# Patient Record
Sex: Male | Born: 1960 | Race: White | Hispanic: No | Marital: Single | Smoking: Current every day smoker
Health system: Southern US, Community
[De-identification: ages and names within clinical notes are randomized; demographics above are authoritative.]

## PROBLEM LIST (undated history)

## (undated) ENCOUNTER — Ambulatory Visit

## (undated) ENCOUNTER — Ambulatory Visit: Admission: EM | Disposition: A | Discharge status: Home / Self Care | Payer: Medicare Other

## (undated) DIAGNOSIS — I10 Essential (primary) hypertension: Secondary | ICD-10-CM

## (undated) DIAGNOSIS — F79 Unspecified intellectual disabilities: Secondary | ICD-10-CM

## (undated) DIAGNOSIS — N289 Disorder of kidney and ureter, unspecified: Secondary | ICD-10-CM

## (undated) DIAGNOSIS — K219 Gastro-esophageal reflux disease without esophagitis: Secondary | ICD-10-CM

## (undated) DIAGNOSIS — F32A Depression, unspecified: Secondary | ICD-10-CM

## (undated) DIAGNOSIS — E119 Type 2 diabetes mellitus without complications: Secondary | ICD-10-CM

## (undated) DIAGNOSIS — F329 Major depressive disorder, single episode, unspecified: Secondary | ICD-10-CM

## (undated) HISTORY — PX: CYST REMOVAL TRUNK: SHX6283

---

## 1998-12-30 ENCOUNTER — Inpatient Hospital Stay (HOSPITAL_COMMUNITY): Admission: EM | Admit: 1998-12-30 | Discharge: 1999-01-02 | Payer: Self-pay | Admitting: *Deleted

## 2000-10-29 ENCOUNTER — Emergency Department (HOSPITAL_COMMUNITY): Admission: EM | Admit: 2000-10-29 | Discharge: 2000-10-29 | Payer: Self-pay | Admitting: Internal Medicine

## 2000-12-28 ENCOUNTER — Emergency Department (HOSPITAL_COMMUNITY): Admission: EM | Admit: 2000-12-28 | Discharge: 2000-12-28 | Payer: Self-pay | Admitting: *Deleted

## 2001-07-31 ENCOUNTER — Emergency Department (HOSPITAL_COMMUNITY): Admission: EM | Admit: 2001-07-31 | Discharge: 2001-07-31 | Payer: Self-pay | Admitting: Emergency Medicine

## 2002-12-22 ENCOUNTER — Emergency Department (HOSPITAL_COMMUNITY): Admission: EM | Admit: 2002-12-22 | Discharge: 2002-12-23 | Payer: Self-pay | Admitting: Emergency Medicine

## 2003-01-02 ENCOUNTER — Emergency Department (HOSPITAL_COMMUNITY): Admission: EM | Admit: 2003-01-02 | Discharge: 2003-01-02 | Payer: Self-pay | Admitting: Emergency Medicine

## 2004-04-16 ENCOUNTER — Emergency Department (HOSPITAL_COMMUNITY): Admission: EM | Admit: 2004-04-16 | Discharge: 2004-04-16 | Payer: Self-pay | Admitting: Emergency Medicine

## 2004-12-03 ENCOUNTER — Emergency Department (HOSPITAL_COMMUNITY): Admission: EM | Admit: 2004-12-03 | Discharge: 2004-12-03 | Payer: Self-pay | Admitting: Emergency Medicine

## 2005-01-29 ENCOUNTER — Emergency Department (HOSPITAL_COMMUNITY): Admission: EM | Admit: 2005-01-29 | Discharge: 2005-01-29 | Payer: Self-pay | Admitting: Emergency Medicine

## 2005-06-09 ENCOUNTER — Ambulatory Visit (HOSPITAL_COMMUNITY): Admission: RE | Admit: 2005-06-09 | Discharge: 2005-06-09 | Payer: Self-pay

## 2009-07-20 ENCOUNTER — Encounter (INDEPENDENT_AMBULATORY_CARE_PROVIDER_SITE_OTHER): Payer: Self-pay | Admitting: *Deleted

## 2009-07-27 ENCOUNTER — Encounter (INDEPENDENT_AMBULATORY_CARE_PROVIDER_SITE_OTHER): Payer: Self-pay | Admitting: *Deleted

## 2009-08-01 ENCOUNTER — Encounter (INDEPENDENT_AMBULATORY_CARE_PROVIDER_SITE_OTHER): Payer: Self-pay | Admitting: *Deleted

## 2009-08-01 ENCOUNTER — Ambulatory Visit: Payer: Self-pay | Admitting: Gastroenterology

## 2009-08-14 ENCOUNTER — Telehealth: Payer: Self-pay | Admitting: Gastroenterology

## 2009-08-15 ENCOUNTER — Ambulatory Visit: Payer: Self-pay | Admitting: Gastroenterology

## 2009-08-17 ENCOUNTER — Encounter: Payer: Self-pay | Admitting: Gastroenterology

## 2010-05-14 NOTE — Progress Notes (Signed)
Summary: ? re proc  Phone Note Call from Patient Call back at Home Phone [phone]   Caller: [place] Caretaker Call For: Sharlett Iles Reason for Call: Talk to Nurse Summary of Call: [facility] caregiver of patient states that the patient had corn yesterday and is schedule for a procedure tomorrow, will he need to resch or will the doctor perform the procedure. Initial call taken by: Ronalee Red,  Aug 14, 2009 1:34 PM  Follow-up for Phone Call        Spoke with caretaker; said he ate some "string beans yesterday." Pt has had clear liquids today.  Writer told caretaker for pt to push fluids today and follow prep instructions exactly.  Caretaker voiced understanding Follow-up by: Randall Hiss RN,  Aug 14, 2009 2:16 PM

## 2010-05-14 NOTE — Letter (Signed)
Summary: Diabetic Instructions  [facility]  [place], [place] [address]   Phone: [phone]  Fax: [phone]    David Zamora 1961/02/09 MRN: [id number]   _X  _   ORAL DIABETIC MEDICATION INSTRUCTIONS  The day before your procedure:   Take your diabetic pill as you do normally  The day of your procedure:   Do not take your diabetic pill    We will check your blood sugar levels during the admission process and again in Recovery before discharging you home  ________________________________________________________________________  _ X _   INSULIN (LONG ACTING) MEDICATION INSTRUCTIONS (Lantus, NPH, 70/30, Humulin, Novolin-N)   The day before your procedure:   Take  your regular evening dose    The day of your procedure:   Do not take your morning dose

## 2010-05-14 NOTE — Letter (Signed)
Summary: [facility] Instructions  [facility]  [place], [place] [address]   Phone: [phone]  Fax: [phone]       JACEN MAHANNAH    [birth year]    MRN: [id number]        Procedure Day Sudie Grumbling:  [facility]  08/15/09     Arrival Time:  8:30AM     Procedure Time:  9:30AM     Location of Procedure:                    _X _  [place] (4th Floor)                        [place]   Starting 5 days prior to your procedure 08/10/09 do not eat nuts, seeds, popcorn, corn, beans, peas,  salads, or any raw vegetables.  Do not take any fiber supplements (e.g. Metamucil, Citrucel, and Benefiber).  THE DAY BEFORE YOUR PROCEDURE         DATE: 08/14/09  DAY: TUESDAY  1.  Drink clear liquids the entire day-NO SOLID FOOD  2.  Do not drink anything colored red or purple.  Avoid juices with pulp.  No orange juice.  3.  Drink at least 64 oz. (8 glasses) of fluid/clear liquids during the day to prevent dehydration and help the prep work efficiently.  CLEAR LIQUIDS INCLUDE: Water Jello Ice Popsicles Tea (sugar ok, no milk/cream) Powdered fruit flavored drinks Coffee (sugar ok, no milk/cream) Gatorade Juice: apple, white grape, white cranberry  Lemonade Clear bullion, consomm, broth Carbonated beverages (any kind) Strained chicken noodle soup Hard Candy                             4.  In the morning, mix first dose of MoviPrep solution:    Empty 1 Pouch A and 1 Pouch B into the disposable container    Add lukewarm drinking water to the top line of the container. Mix to dissolve    Refrigerate (mixed solution should be used within 24 hrs)  5.  Begin drinking the prep at 5:00 p.m. The MoviPrep container is divided by 4 marks.   Every 15 minutes drink the solution down to the next mark (approximately 8 oz) until the full liter is complete.   6.  Follow completed prep with 16 oz of clear liquid of your choice (Nothing  red or purple).  Continue to drink clear liquids until bedtime.  7.  Before going to bed, mix second dose of MoviPrep solution:    Empty 1 Pouch A and 1 Pouch B into the disposable container    Add lukewarm drinking water to the top line of the container. Mix to dissolve    Refrigerate  THE DAY OF YOUR PROCEDURE      DATE: 08/15/09  DAY:   WEDNESDAY  Beginning at 4:30AM (5 hours before procedure):         1. Every 15 minutes, drink the solution down to the next mark (approx 8 oz) until the full liter is complete.  2. Follow completed prep with 16 oz. of clear liquid of your choice.    3. You may drink clear liquids until 7:30AM (2 HOURS BEFORE PROCEDURE).   MEDICATION INSTRUCTIONS  Unless otherwise instructed, you should take regular prescription medications with a small sip of water   as early as possible  the morning of your procedure.  Diabetic patients - see separate instructions.   Additional medication instructions: Hold morning Triamterene/HCTZ the morning of procedure.         OTHER INSTRUCTIONS  You will need a responsible adult at least 52 years of age to accompany you and drive you home.   This person must remain in the waiting room during your procedure.  Wear loose fitting clothing that is easily removed.  Leave jewelry and other valuables at home.  However, you may wish to bring a book to read or  an iPod/MP3 player to listen to music as you wait for your procedure to start.  Remove all body piercing jewelry and leave at home.  Total time from sign-in until discharge is approximately 2-3 hours.  You should go home directly after your procedure and rest.  You can resume normal activities the  day after your procedure.  The day of your procedure you should not:   Drive   Make legal decisions   Operate machinery   Drink alcohol   Return to work  You will receive specific instructions about eating, activities and medications before you  leave.    The above instructions have been reviewed and explained to me by   Emerson Monte RN  August 01, 2009 11:49 AM     I fully understand and can verbalize these instructions _____________________________ Date _________

## 2010-05-14 NOTE — Letter (Signed)
Summary: Previsit letter  [facility]  [place], [place] [address]   Phone: [phone]  Fax: [phone]       07/20/2009 MRN: [id number]  [place], [place]  [address]  Dear David Zamora,  Welcome to the Gastroenterology Division at [facility].    You are scheduled to see a nurse for your pre-procedure visit on 08-01-09 at 11AM on the 3rd floor at [facility], [place] [employer].  We ask that you try to arrive at our office 15 minutes prior to your appointment time to allow for check-in.  Your nurse visit will consist of discussing your medical and surgical history, your immediate family medical history, and your medications.    Please bring a complete list of all your medications or, if you prefer, bring the medication bottles and we will list them.  We will need to be aware of both prescribed and over the counter drugs.  We will need to know exact dosage information as well.  If you are on blood thinners (Coumadin, Plavix, Aggrenox, Ticlid, etc.) please call our office today/prior to your appointment, as we need to consult with your physician about holding your medication.   Please be prepared to read and sign documents such as consent forms, a financial agreement, and acknowledgement forms.  If necessary, and with your consent, a friend or relative is welcome to sit-in on the nurse visit with you.  Please bring your insurance card so that we may make a copy of it.  If your insurance requires a referral to see a specialist, please bring your referral form from your primary care physician.  No co-pay is required for this nurse visit.     If you cannot keep your appointment, please call [phone] to cancel or reschedule prior to your appointment date.  This allows [place] the opportunity to schedule an appointment for another patient in need of care.    Thank you for choosing [facility] for your medical needs.  We  appreciate the opportunity to care for you.  Please visit [place] at our website  to learn more about our practice.                     Sincerely.                                                                                                                   The Gastroenterology Division

## 2010-05-14 NOTE — Miscellaneous (Signed)
Summary: [facility] Previsit/prep  Clinical Lists Changes  Medications: Added new medication of MOVIPREP 100 GM  SOLR (PEG-KCL-NACL-NASULF-NA ASC-C) As per prep instructions. - Signed Rx of MOVIPREP 100 GM  SOLR (PEG-KCL-NACL-NASULF-NA ASC-C) As per prep instructions.;  #1 x 0;  Signed;  Entered by: Emerson Monte RN;  Authorized by: Sable Feil MD [facility];  Method used: Electronically to [facility] [address], [place], [place], [place]  [address], Ph: [phone], Fax: [phone] Observations: Added new observation of NKA: T (08/01/2009 10:06)    Prescriptions: MOVIPREP 100 GM  SOLR (PEG-KCL-NACL-NASULF-NA ASC-C) As per prep instructions.  #1 x 0   Entered by:   Emerson Monte RN   Authorized by:   Sable Feil MD [facility]   Signed by:   Emerson Monte RN on 08/01/2009   Method used:   Electronically to        [facility] (retail)       [address]       [place], [place]  [address]       Ph: [phone]       Fax: [phone]   RxID:   [id number]

## 2010-05-14 NOTE — Letter (Signed)
Summary: Patient Notice- Polyp Results  [facility]  [address] [place], [place] [address]   Phone: [phone]  Fax: [phone]        Aug 17, 2009 MRN: [id number]    [facility] [place],   [address]    Dear David Zamora,  I am pleased to inform you that the colon polyp(s) removed during your recent colonoscopy was (were) found to be benign (no cancer detected) upon pathologic examination.  I recommend you have a repeat colonoscopy examination in 10_ years to look for recurrent polyps, as having colon polyps increases your risk for having recurrent polyps or even colon cancer in the future.  Should you develop new or worsening symptoms of abdominal pain, bowel habit changes or bleeding from the rectum or bowels, please schedule an evaluation with either your primary care physician or with me.  Additional information/recommendations:  x__ No further action with gastroenterology is needed at this time. Please      follow-up with your primary care physician for your other healthcare      needs.  __ Please call [phone] to schedule a return visit to review your      situation.  __ Please keep your follow-up visit as already scheduled.  __ Continue treatment plan as outlined the day of your exam.  Please call us if you are having persistent problems or have questions about your condition that have not been fully answered at this time.  Sincerely,  Sable Feil MD [facility]  This letter has been electronically signed by your physician.  Appended Document: Patient Notice- Polyp Results letter mailed 4.9.11.

## 2010-05-14 NOTE — Procedures (Signed)
Summary: Colonoscopy  Patient: [name] Note: All result statuses are Final unless otherwise noted.  Tests: (1) Colonoscopy (COL)   COL Colonoscopy           [facility]     [place], [place]  [address]           COLONOSCOPY PROCEDURE REPORT           PATIENT:  David Zamora, David Zamora  MR#:  [id number]     BIRTHDATE:  02-04-1961, 48 yrs. old  GENDER:  male     ENDOSCOPIST:  Loralee Pacas. Sharlett Iles, MD, [facility]     REF. BY:  Jerene Bears, M.D.     PROCEDURE DATE:  08/15/2009     PROCEDURE:  Colonoscopy with snare polypectomy     ASA CLASS:  Class II     INDICATIONS:  Routine Risk Screening     MEDICATIONS:   Fentanyl 50 mcg IV, Versed 5 mg IV           DESCRIPTION OF PROCEDURE:   After the risks benefits and     alternatives of the procedure were thoroughly explained, informed     consent was obtained.  Digital rectal exam was performed and     revealed no abnormalities.   The LB CF-[id number] endoscope     was introduced through the anus and advanced to the cecum, which     was identified by both the appendix and ileocecal valve, without     limitations.  The quality of the prep was excellent, using     MoviPrep.  The instrument was then slowly withdrawn as the colon     was fully examined.     <<PROCEDUREIMAGES>>           FINDINGS:  There were multiple polyps identified and removed.     SMALL DIMINUTIVE RECTOSIGMOID NODULES COLD SNARE EXCISED.  Mild     diverticulosis was found. SCATTERED IN CECUM AND SIGMOID AREA.     Retroflexed views in the rectum revealed no abnormalities.    The     scope was then withdrawn from the patient and the procedure     completed.           COMPLICATIONS:  None     ENDOSCOPIC IMPRESSION:     1) Polyps, multiple     2) Mild diverticulosis     R/O ADENOMAS.     RECOMMENDATIONS:     1) Repeat colonoscopy in 5 years if polyp adenomatous; otherwise     10 years     REPEAT EXAM:  No        ______________________________     Loralee Pacas. Sharlett Iles, MD, [facility]           CC:           n.     eSIGNED:   Loralee Pacas. Patterson at 08/15/2009 09:58 AM           Charna Archer, [id number]  Note: An exclamation mark (!) indicates a result that was not dispersed into the flowsheet. Document Creation Date: 08/15/2009 9:59 AM _______________________________________________________________________  (1) Order result status: Final Collection or observation date-time: 08/15/2009 09:52 Requested date-time:  Receipt date-time:  Reported date-time:  Referring Physician:   Ordering Physician: Verl Blalock [phone]) Specimen Source:  Source: Tawanna Cooler Order Number: [id number] Lab site:   Appended Document: Colonoscopy     Procedures Next Due Date:  Colonoscopy: 08/2019

## 2011-04-13 ENCOUNTER — Encounter: Payer: Self-pay | Admitting: *Deleted

## 2011-04-13 ENCOUNTER — Emergency Department (HOSPITAL_COMMUNITY)
Admission: EM | Admit: 2011-04-13 | Discharge: 2011-04-16 | Disposition: A | Discharge status: Home / Self Care | Payer: Medicare Other | Attending: Emergency Medicine | Admitting: Emergency Medicine

## 2011-04-13 ENCOUNTER — Emergency Department (HOSPITAL_COMMUNITY): Admission: EM | Admit: 2011-04-13 | Discharge: 2011-04-13 | Payer: Self-pay | Source: Home / Self Care

## 2011-04-13 DIAGNOSIS — R45851 Suicidal ideations: Secondary | ICD-10-CM | POA: Insufficient documentation

## 2011-04-13 DIAGNOSIS — X838XXA Intentional self-harm by other specified means, initial encounter: Secondary | ICD-10-CM | POA: Insufficient documentation

## 2011-04-13 DIAGNOSIS — T71192A Asphyxiation due to mechanical threat to breathing due to other causes, intentional self-harm, initial encounter: Secondary | ICD-10-CM | POA: Insufficient documentation

## 2011-04-13 DIAGNOSIS — IMO0002 Reserved for concepts with insufficient information to code with codable children: Secondary | ICD-10-CM | POA: Insufficient documentation

## 2011-04-13 DIAGNOSIS — Z8659 Personal history of other mental and behavioral disorders: Secondary | ICD-10-CM | POA: Insufficient documentation

## 2011-04-13 DIAGNOSIS — Y9210 Unspecified residential institution as the place of occurrence of the external cause: Secondary | ICD-10-CM | POA: Insufficient documentation

## 2011-04-13 HISTORY — DX: Gastro-esophageal reflux disease without esophagitis: K21.9

## 2011-04-13 HISTORY — DX: Unspecified intellectual disabilities: F79

## 2011-04-13 HISTORY — DX: Major depressive disorder, single episode, unspecified: F32.9

## 2011-04-13 HISTORY — DX: Depression, unspecified: F32.A

## 2011-04-13 HISTORY — DX: Essential (primary) hypertension: I10

## 2011-04-13 LAB — COMPREHENSIVE METABOLIC PANEL
AST: 28 U/L (ref 0–37)
CO2: 27 mEq/L (ref 19–32)
Chloride: 106 mEq/L (ref 96–112)
GFR calc Af Amer: 87 mL/min — ABNORMAL LOW (ref >90)
GFR calc non Af Amer: 75 mL/min — ABNORMAL LOW (ref >90)
Glucose, Bld: 114 mg/dL — ABNORMAL HIGH (ref 70–99)

## 2011-04-13 LAB — GLUCOSE, CAPILLARY

## 2011-04-13 LAB — CBC
HCT: 38.1 % — ABNORMAL LOW (ref 39.0–52.0)
MCH: 32.9 pg (ref 26.0–34.0)
MCV: 95.7 fL (ref 78.0–100.0)
Platelets: 247 10*3/uL (ref 150–400)
RBC: 3.98 MIL/uL — ABNORMAL LOW (ref 4.22–5.81)
WBC: 5.2 10*3/uL (ref 4.0–10.5)

## 2011-04-13 LAB — RAPID URINE DRUG SCREEN, HOSP PERFORMED
Amphetamines: NOT DETECTED
Barbiturates: NOT DETECTED
Cocaine: NOT DETECTED
Opiates: NOT DETECTED

## 2011-04-13 LAB — VALPROIC ACID LEVEL: Valproic Acid Lvl: 30.4 ug/mL — ABNORMAL LOW (ref 50.0–100.0)

## 2011-04-13 MED ORDER — TETANUS-DIPHTH-ACELL PERTUSSIS 5-2.5-18.5 LF-MCG/0.5 IM SUSP
0.5000 mL | Freq: Once | INTRAMUSCULAR | Status: AC
  Administered 2011-04-13: 0.5 mL via INTRAMUSCULAR
  Filled 2011-04-13: qty 0.5

## 2011-04-13 MED ORDER — LORAZEPAM 1 MG PO TABS: 1.0000 mg | ORAL_TABLET | Freq: Three times a day (TID) | ORAL | Status: DC | PRN

## 2011-04-13 MED ORDER — HALOPERIDOL LACTATE 5 MG/ML IJ SOLN
10.0000 mg | Freq: Once | INTRAMUSCULAR | Status: AC
  Administered 2011-04-13: 10 mg via INTRAMUSCULAR

## 2011-04-13 MED ORDER — HALOPERIDOL LACTATE 5 MG/ML IJ SOLN
INTRAMUSCULAR | Status: AC
  Administered 2011-04-13: 10 mg
  Filled 2011-04-13: qty 2

## 2011-04-13 MED ORDER — IBUPROFEN 600 MG PO TABS: 600.0000 mg | ORAL_TABLET | Freq: Three times a day (TID) | ORAL | Status: DC | PRN

## 2011-04-13 MED ORDER — INSULIN GLARGINE 100 UNIT/ML ~~LOC~~ SOLN
SUBCUTANEOUS | Status: AC
  Filled 2011-04-13: qty 1

## 2011-04-13 MED ORDER — ACETAMINOPHEN 325 MG PO TABS: 650.0000 mg | ORAL_TABLET | ORAL | Status: DC | PRN

## 2011-04-13 MED ORDER — INSULIN GLARGINE 100 UNIT/ML ~~LOC~~ SOLN: 15.0000 [IU] | Freq: Every day | SUBCUTANEOUS | Status: DC

## 2011-04-13 MED ORDER — NICOTINE 21 MG/24HR TD PT24
21.0000 mg | MEDICATED_PATCH | Freq: Every day | TRANSDERMAL | Status: DC
  Administered 2011-04-14: 21 mg via TRANSDERMAL
  Filled 2011-04-13 (×3): qty 1
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
## 2011-04-13 NOTE — ED Notes (Signed)
Security/gpd at bedside, pt laying on bed
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
## 2011-04-13 NOTE — ED Notes (Signed)
Security/gpd at bedside, dr Cathleen Fears into see
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
## 2011-04-13 NOTE — ED Notes (Signed)
Patient refused TDaP injection. Dr. Ashok Cordia notified. Will cont to monitor.
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
## 2011-04-13 NOTE — ED Notes (Signed)
Pt placed in blue scrubs, wanded by security

## 2011-04-13 NOTE — ED Notes (Signed)
1 bag of belongings locked in activity room

## 2011-04-13 NOTE — ED Notes (Signed)
Report given to psy ED

## 2011-04-14 LAB — GLUCOSE, CAPILLARY
Glucose-Capillary: 136 mg/dL — ABNORMAL HIGH (ref 70–99)
Glucose-Capillary: 195 mg/dL — ABNORMAL HIGH (ref 70–99)
Glucose-Capillary: 199 mg/dL — ABNORMAL HIGH (ref 70–99)

## 2011-04-14 MED ORDER — INSULIN DETEMIR 100 UNIT/ML ~~LOC~~ SOLN
20.0000 [IU] | Freq: Every day | SUBCUTANEOUS | Status: DC
Start: 2011-04-14 — End: 2011-04-16
  Administered 2011-04-14 – 2011-04-15 (×2): 20 [IU] via SUBCUTANEOUS
  Filled 2011-04-14: qty 3

## 2011-04-14 MED ORDER — BUPROPION HCL ER (XL) 150 MG PO TB24
150.0000 mg | ORAL_TABLET | Freq: Every day | ORAL | Status: DC
  Administered 2011-04-14 – 2011-04-16 (×3): 150 mg via ORAL
  Filled 2011-04-14 (×4): qty 1

## 2011-04-14 MED ORDER — METFORMIN HCL 500 MG PO TABS
500.0000 mg | ORAL_TABLET | Freq: Every day | ORAL | Status: DC
  Administered 2011-04-14 – 2011-04-16 (×3): 500 mg via ORAL
  Filled 2011-04-14 (×4): qty 1

## 2011-04-14 MED ORDER — FLUOXETINE HCL 20 MG PO CAPS
20.0000 mg | ORAL_CAPSULE | Freq: Every day | ORAL | Status: DC
  Administered 2011-04-14 – 2011-04-16 (×3): 20 mg via ORAL
  Filled 2011-04-14 (×4): qty 1

## 2011-04-14 MED ORDER — DIVALPROEX SODIUM ER 500 MG PO TB24
1000.0000 mg | ORAL_TABLET | Freq: Every day | ORAL | Status: DC
  Administered 2011-04-14 – 2011-04-15 (×3): 1000 mg via ORAL
  Filled 2011-04-14 (×6): qty 2

## 2011-04-14 MED ORDER — SIMVASTATIN 40 MG PO TABS
40.0000 mg | ORAL_TABLET | Freq: Every day | ORAL | Status: DC
  Administered 2011-04-14 – 2011-04-15 (×2): 40 mg via ORAL
  Filled 2011-04-14 (×4): qty 1

## 2011-04-14 MED ORDER — QUETIAPINE FUMARATE 100 MG PO TABS
100.0000 mg | ORAL_TABLET | Freq: Every day | ORAL | Status: DC
  Administered 2011-04-14 – 2011-04-15 (×2): 100 mg via ORAL
  Filled 2011-04-14 (×2): qty 1

## 2011-04-14 MED ORDER — TRIAMTERENE-HCTZ 37.5-25 MG PO TABS
0.5000 | ORAL_TABLET | Freq: Every day | ORAL | Status: DC
  Administered 2011-04-14: 1 via ORAL
  Administered 2011-04-15 – 2011-04-16 (×2): 0.5 via ORAL
  Filled 2011-04-14 (×4): qty 0.5

## 2011-04-14 MED ORDER — DIVALPROEX SODIUM 500 MG PO DR TAB: 1000.0000 mg | DELAYED_RELEASE_TABLET | Freq: Once | ORAL | Status: DC

## 2011-04-14 MED ORDER — DIVALPROEX SODIUM 250 MG PO DR TAB: 250.0000 mg | DELAYED_RELEASE_TABLET | ORAL | Status: DC

## 2011-04-14 MED ORDER — DIVALPROEX SODIUM ER 250 MG PO TB24
250.0000 mg | ORAL_TABLET | Freq: Every day | ORAL | Status: DC
  Administered 2011-04-14 – 2011-04-16 (×3): 250 mg via ORAL
  Filled 2011-04-14 (×4): qty 1

## 2011-04-14 MED ORDER — BUPROPION HCL ER (XL) 150 MG PO TB24: 150.0000 mg | ORAL_TABLET | Freq: Every day | ORAL | Status: DC

## 2011-04-14 NOTE — ED Provider Notes (Addendum)
He was seen at 09:35 hours and was sleeping. He has apparently been stable overnight after medication following his outburst yesterday. A tele-psychiatric assessment has been done, and has recommended inpatient treatment, as well as changing to a different group home  Richarda Blade, MD 04/14/11 [phone]  I have been informed by the psychiatric nurse at the patient has been cleared to return to his group home. The patient has been followed by Dr. Sherlynn Stalls here in the ED. Personnel  from group home, here to pick him up.  Richarda Blade, MD 04/16/11 [phone]

## 2011-04-15 DIAGNOSIS — T71192A Asphyxiation due to mechanical threat to breathing due to other causes, intentional self-harm, initial encounter: Secondary | ICD-10-CM

## 2011-04-15 DIAGNOSIS — IMO0002 Reserved for concepts with insufficient information to code with codable children: Secondary | ICD-10-CM

## 2011-04-15 DIAGNOSIS — R45851 Suicidal ideations: Secondary | ICD-10-CM

## 2011-04-15 LAB — GLUCOSE, CAPILLARY: Glucose-Capillary: 104 mg/dL — ABNORMAL HIGH (ref 70–99)

## 2011-04-15 NOTE — Progress Notes (Signed)
ASSESSOR PER PREVIOUS REPORT RECEIVED FROM [facility] P. DR. AHLUWALIA HAS CLEARED PATIENT, AND PT MAY RETURN TO GROUP HOME IF THEY ARE WILLING TO ACCEPT PT BACK. ASSESSOR ATTEMPTED TO REACH GROUP HOME OWNER "PETE" AT TELEPHONE NUMBER LISTED ON PT'S CHART, AS PROVIDED BY PT'S NURSE JANIE. ASSESSOR ASKED PT IF HE KNEW OF THE NAME OF THE GROUP HOME HE WAS CURRENTLY RESIDING AT OR AN ALTERNATIVE CONTACT PERSON. PT STATES HE DOES NOT.   PT REPORTS THAT PER "PETE"GROUP HOME OWNER, SAYS HE CAN GO BACK TO GROUP HOME. PT ALSO REPORTS THAT "PETE" WENT TO [place] TODAY TO PICK UP PT'S CAMER[place] AND WILL NOT BE BACK UNTIL LATER THIS EVENING. ASSESSOR UNABLE TO CONFIRM OFFICIALLY WITH GROUP HOME STAFF IF PT CAN RETURN. ON CALL SW NOTIFIED TO FOLLOW UP WITH PT'S DISPOSITION AND RETURNING TO GROUP HOME.ASSESSOR LEFT DETAILED MESSAGE @[phone] FOR SW TO FOLLOW UP WITH PT'S DISPOSITION IN REGARD TO GROUP HOME RETURN OR ALTERNATIVE PLACEMENT IF PT IS UNABLE TO RETURN TO GROUP HOME. EDP DR. LINKER NOTIFIED OF PT'S CURRENT STATUS.

## 2011-04-15 NOTE — ED Notes (Signed)
Care assumed, pt in room lying in bed watching television

## 2011-04-15 NOTE — ED Provider Notes (Signed)
Pt seen and evaluated in psych ED.  He is eating breakfast and is calm and cooperative at this time.  He is under IVC.  He has been assessed by ACT team and is under review at [facility].    Threasa Beards, MD 04/15/11 [phone]

## 2011-04-15 NOTE — Consult Note (Signed)
  David Zamora is a 51 y.o. male [id number] April 24, 1960  Subjective/Objective:  Patient is doing much better his affect improved. He denies having suicidal or homicidal ideations. Patient told me that he is going to work with his case Freight forwarder and will regularly talk with him about his issues and will not do anything to hold himself again. Patient is not hallucinating or delusional. He is alert awake oriented x3. Attention concentration good abstract ability fair insight and judgment intact.  Filed Vitals:   04/15/11 1216  BP: 116/77  Pulse: 77  Temp: 98.5 F (36.9 C)  Resp: 19    Lab Results:   BMET    Component Value Date/Time   NA 142 04/13/2011 1400   K 4.1 04/13/2011 1400   CL 106 04/13/2011 1400   CO2 27 04/13/2011 1400   GLUCOSE 114* 04/13/2011 1400   BUN 20 04/13/2011 1400   CREATININE 1.10 04/13/2011 1400   CALCIUM 9.7 04/13/2011 1400   GFRNONAA 75* 04/13/2011 1400   GFRAA 87* 04/13/2011 1400    Medications:  Scheduled:     . buPROPion  150 mg Oral Daily  . divalproex  1,000 mg Oral QHS  . divalproex  250 mg Oral Daily  . FLUoxetine  20 mg Oral Daily  . insulin detemir  20 Units Subcutaneous QHS  . metFORMIN  500 mg Oral Q breakfast  . nicotine  21 mg Transdermal Daily  . QUEtiapine  100 mg Oral QHS  . simvastatin  40 mg Oral QHS  . triamterene-hydrochlorothiazide  0.5 each Oral Daily     PRN Meds acetaminophen, ibuprofen, LORazepam  Assessment/Plan:  Patient can be discharged to followup in the outpatient setting.  AHLUWALIA,SHAMSHER S MD 04/15/2011

## 2011-04-15 NOTE — BH Assessment (Signed)
Patient evaluated by Dr. Sherlynn Stalls and cleared from psych. Dr. Sherlynn Stalls recommended that patient returns to group home. On-coming staff Pincus Sanes) will initiate process of having patient return to group home if they will accept him back .

## 2011-04-15 NOTE — ED Notes (Signed)
ACT into see 

## 2011-04-15 NOTE — BH Assessment (Deleted)
[place] referral needed, per Dr. Sherlynn Stalls. On-coming staff made aware of patients disposition. Patient declined from in-pt admission to [facility], per Dr. Sherlynn Stalls.

## 2011-04-15 NOTE — ED Notes (Signed)
Dr Clovia Cuff into see

## 2011-04-15 NOTE — ED Notes (Signed)
Up to the bathroom 

## 2011-04-15 NOTE — ED Provider Notes (Signed)
Patient resting comfortably with no complaints.  Dot Lanes, MD 04/15/11 2227

## 2011-04-16 LAB — GLUCOSE, CAPILLARY: Glucose-Capillary: 159 mg/dL — ABNORMAL HIGH (ref 70–99)

## 2011-04-16 NOTE — Discharge Planning (Signed)
[name] spoke with Laurey Arrow, patient's caregiver who advised patient is able to return to his facility. Laurey Arrow has go get patient's meds from pharmacy in [place], and he will pick patient up when he returns from there.   [name] provided him with contact number to advise when he is on his way. Laurey Arrow advised he hopes to be here by 3pm.  Laurena Spies , MSW, LCSWA 04/16/2011 10:01 AM [id number]

## 2011-04-16 NOTE — ED Notes (Signed)
[name] reviewed with pt and group home owner, [name] [phone]), psychiatrists and therapists in the area as well as the mobile crisis unit. Pt was able to sign a no harm contract with [name] and [place] witnessing. David Zamora, mother and legal guardian [phone]) notified of pt returning to group home. No further needs identified at this time.

## 2011-04-16 NOTE — ED Notes (Signed)
Patient discharged to group home with caregiver

## 2011-04-16 NOTE — Consult Note (Signed)
  David Zamora is a 51 y.o. male [id number] [birth year]  Subjective/Objective: Patient is doing better is not suicidal homicidal not hallucinating or delusional. No side effects reported from the medications. Psychoeducation given to the patient.   Filed Vitals:   04/16/11 0613  BP:   Pulse: 62  Temp: 97.7 F (36.5 C)  Resp: 16    Lab Results:   BMET    Component Value Date/Time   NA 142 04/13/2011 1400   K 4.1 04/13/2011 1400   CL 106 04/13/2011 1400   CO2 27 04/13/2011 1400   GLUCOSE 114* 04/13/2011 1400   BUN 20 04/13/2011 1400   CREATININE 1.10 04/13/2011 1400   CALCIUM 9.7 04/13/2011 1400   GFRNONAA 75* 04/13/2011 1400   GFRAA 87* 04/13/2011 1400    Medications:  Scheduled:     . buPROPion  150 mg Oral Daily  . divalproex  1,000 mg Oral QHS  . divalproex  250 mg Oral Daily  . FLUoxetine  20 mg Oral Daily  . insulin detemir  20 Units Subcutaneous QHS  . metFORMIN  500 mg Oral Q breakfast  . nicotine  21 mg Transdermal Daily  . QUEtiapine  100 mg Oral QHS  . simvastatin  40 mg Oral QHS  . triamterene-hydrochlorothiazide  0.5 each Oral Daily     PRN Meds acetaminophen, ibuprofen, LORazepam  Assessment/Plan:  Patient can be discharged to the group home.  Jillianne Gamino S MD 04/16/2011

## 2011-04-16 NOTE — ED Provider Notes (Signed)
Vitals stable, glucoses stable, resting currently with no complaints.  Telepsych consultation recommends inpatient.  Placement is pending.    David Zamora. Dorna Mai, MD 04/16/11 [id number]

## 2012-11-12 ENCOUNTER — Emergency Department (HOSPITAL_COMMUNITY)
Admission: EM | Admit: 2012-11-12 | Discharge: 2012-11-13 | Disposition: A | Discharge status: Home / Self Care | Payer: Medicare Other | Attending: Emergency Medicine | Admitting: Emergency Medicine

## 2012-11-12 ENCOUNTER — Encounter (HOSPITAL_COMMUNITY): Payer: Self-pay | Admitting: Emergency Medicine

## 2012-11-12 DIAGNOSIS — Z79899 Other long term (current) drug therapy: Secondary | ICD-10-CM | POA: Insufficient documentation

## 2012-11-12 DIAGNOSIS — K219 Gastro-esophageal reflux disease without esophagitis: Secondary | ICD-10-CM | POA: Insufficient documentation

## 2012-11-12 DIAGNOSIS — E119 Type 2 diabetes mellitus without complications: Secondary | ICD-10-CM

## 2012-11-12 DIAGNOSIS — IMO0002 Reserved for concepts with insufficient information to code with codable children: Secondary | ICD-10-CM

## 2012-11-12 DIAGNOSIS — R45851 Suicidal ideations: Secondary | ICD-10-CM | POA: Insufficient documentation

## 2012-11-12 DIAGNOSIS — R4589 Other symptoms and signs involving emotional state: Secondary | ICD-10-CM

## 2012-11-12 DIAGNOSIS — F79 Unspecified intellectual disabilities: Secondary | ICD-10-CM | POA: Insufficient documentation

## 2012-11-12 DIAGNOSIS — F329 Major depressive disorder, single episode, unspecified: Secondary | ICD-10-CM | POA: Insufficient documentation

## 2012-11-12 DIAGNOSIS — I10 Essential (primary) hypertension: Secondary | ICD-10-CM | POA: Insufficient documentation

## 2012-11-12 DIAGNOSIS — F911 Conduct disorder, childhood-onset type: Secondary | ICD-10-CM | POA: Insufficient documentation

## 2012-11-12 DIAGNOSIS — F3289 Other specified depressive episodes: Secondary | ICD-10-CM | POA: Insufficient documentation

## 2012-11-12 DIAGNOSIS — Z794 Long term (current) use of insulin: Secondary | ICD-10-CM | POA: Insufficient documentation

## 2012-11-12 DIAGNOSIS — F17200 Nicotine dependence, unspecified, uncomplicated: Secondary | ICD-10-CM | POA: Insufficient documentation

## 2012-11-12 LAB — BASIC METABOLIC PANEL
BUN: 27 mg/dL — ABNORMAL HIGH (ref 6–23)
CO2: 24 mEq/L (ref 19–32)
Calcium: 10.3 mg/dL (ref 8.4–10.5)
Chloride: 101 mEq/L (ref 96–112)
Creatinine, Ser: 1.5 mg/dL — ABNORMAL HIGH (ref 0.50–1.35)
GFR calc Af Amer: 59 mL/min — ABNORMAL LOW (ref >90)
GFR calc non Af Amer: 51 mL/min — ABNORMAL LOW (ref >90)
Glucose, Bld: 133 mg/dL — ABNORMAL HIGH (ref 70–99)
Potassium: 4.2 mEq/L (ref 3.5–5.1)
Sodium: 136 mEq/L (ref 135–145)

## 2012-11-12 LAB — CBC
Hemoglobin: 13 g/dL (ref 13.0–17.0)
MCHC: 34.9 g/dL (ref 30.0–36.0)
Platelets: 272 10*3/uL (ref 150–400)
RBC: 3.95 MIL/uL — ABNORMAL LOW (ref 4.22–5.81)

## 2012-11-12 LAB — ETHANOL: Alcohol, Ethyl (B): <11 mg/dL (ref 0–11)

## 2012-11-12 LAB — RAPID URINE DRUG SCREEN, HOSP PERFORMED
Amphetamines: NOT DETECTED
Barbiturates: NOT DETECTED
Benzodiazepines: NOT DETECTED
Cocaine: NOT DETECTED
Opiates: NOT DETECTED
Tetrahydrocannabinol: NOT DETECTED

## 2012-11-12 MED ORDER — SIMVASTATIN 40 MG PO TABS
40.0000 mg | ORAL_TABLET | Freq: Every day | ORAL | Status: DC
  Filled 2012-11-12 (×2): qty 1

## 2012-11-12 MED ORDER — INSULIN DETEMIR 100 UNIT/ML ~~LOC~~ SOLN
20.0000 [IU] | Freq: Every day | SUBCUTANEOUS | Status: DC
  Filled 2012-11-12 (×2): qty 0.2

## 2012-11-12 MED ORDER — NICOTINE 21 MG/24HR TD PT24
21.0000 mg | MEDICATED_PATCH | Freq: Every day | TRANSDERMAL | Status: DC
  Filled 2012-11-12: qty 1

## 2012-11-12 MED ORDER — INSULIN ASPART 100 UNIT/ML ~~LOC~~ SOLN
0.0000 [IU] | Freq: Three times a day (TID) | SUBCUTANEOUS | Status: DC
  Administered 2012-11-13: 2 [IU] via SUBCUTANEOUS
  Filled 2012-11-12: qty 2

## 2012-11-12 MED ORDER — ONDANSETRON HCL 4 MG PO TABS: 4.0000 mg | ORAL_TABLET | Freq: Three times a day (TID) | ORAL | Status: DC | PRN

## 2012-11-12 MED ORDER — ZOLPIDEM TARTRATE 5 MG PO TABS: 5.0000 mg | ORAL_TABLET | Freq: Every evening | ORAL | Status: DC | PRN

## 2012-11-12 MED ORDER — ALUM & MAG HYDROXIDE-SIMETH 200-200-20 MG/5ML PO SUSP: 30.0000 mL | ORAL | Status: DC | PRN

## 2012-11-12 MED ORDER — QUETIAPINE FUMARATE 25 MG PO TABS: 25.0000 mg | ORAL_TABLET | Freq: Every day | ORAL | Status: DC

## 2012-11-12 MED ORDER — BUPROPION HCL ER (XL) 150 MG PO TB24
150.0000 mg | ORAL_TABLET | Freq: Every morning | ORAL | Status: DC
  Administered 2012-11-13: 150 mg via ORAL
  Filled 2012-11-12: qty 1

## 2012-11-12 MED ORDER — FENOFIBRATE 160 MG PO TABS
160.0000 mg | ORAL_TABLET | Freq: Every day | ORAL | Status: DC
  Administered 2012-11-13: 160 mg via ORAL
  Filled 2012-11-12: qty 1

## 2012-11-12 MED ORDER — PANTOPRAZOLE SODIUM 40 MG PO TBEC
40.0000 mg | DELAYED_RELEASE_TABLET | Freq: Every day | ORAL | Status: DC
  Administered 2012-11-13: 40 mg via ORAL
  Filled 2012-11-12: qty 1

## 2012-11-12 MED ORDER — TRIAMTERENE-HCTZ 37.5-25 MG PO TABS
0.5000 | ORAL_TABLET | Freq: Every morning | ORAL | Status: DC
  Administered 2012-11-13: 0.5 via ORAL
  Filled 2012-11-12: qty 0.5

## 2012-11-12 MED ORDER — METFORMIN HCL 500 MG PO TABS: 500.0000 mg | ORAL_TABLET | Freq: Every morning | ORAL | Status: DC

## 2012-11-12 MED ORDER — PAROXETINE HCL 20 MG PO TABS
20.0000 mg | ORAL_TABLET | Freq: Every morning | ORAL | Status: DC
  Administered 2012-11-13: 20 mg via ORAL
  Filled 2012-11-12: qty 1

## 2012-11-12 MED ORDER — IRBESARTAN 150 MG PO TABS
150.0000 mg | ORAL_TABLET | Freq: Every day | ORAL | Status: DC
  Administered 2012-11-13: 150 mg via ORAL
  Filled 2012-11-12: qty 1

## 2012-11-12 MED ORDER — ACETAMINOPHEN 325 MG PO TABS: 650.0000 mg | ORAL_TABLET | ORAL | Status: DC | PRN

## 2012-11-12 MED ORDER — IBUPROFEN 200 MG PO TABS: 600.0000 mg | ORAL_TABLET | Freq: Three times a day (TID) | ORAL | Status: DC | PRN

## 2012-11-12 MED ORDER — LORAZEPAM 1 MG PO TABS: 1.0000 mg | ORAL_TABLET | Freq: Three times a day (TID) | ORAL | Status: DC | PRN

## 2012-11-12 MED ORDER — DIVALPROEX SODIUM ER 250 MG PO TB24: 250.0000 mg | ORAL_TABLET | Freq: Two times a day (BID) | ORAL | Status: DC
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
## 2012-11-12 NOTE — ED Notes (Signed)
Pt is unable to urinate at this time. 

## 2012-11-13 DIAGNOSIS — F339 Major depressive disorder, recurrent, unspecified: Secondary | ICD-10-CM

## 2012-11-13 LAB — CREATININE, SERUM: Creatinine, Ser: 1.33 mg/dL (ref 0.50–1.35)

## 2012-11-13 LAB — GLUCOSE, CAPILLARY
Glucose-Capillary: 148 mg/dL — ABNORMAL HIGH (ref 70–99)
Glucose-Capillary: 109 mg/dL — ABNORMAL HIGH (ref 70–99)

## 2012-11-13 MED ORDER — DIVALPROEX SODIUM ER 250 MG PO TB24
250.0000 mg | ORAL_TABLET | Freq: Every day | ORAL | Status: DC
  Administered 2012-11-13: 250 mg via ORAL
  Filled 2012-11-13: qty 1

## 2012-11-13 MED ORDER — DIVALPROEX SODIUM ER 500 MG PO TB24
1000.0000 mg | ORAL_TABLET | Freq: Every day | ORAL | Status: DC
  Filled 2012-11-13 (×2): qty 2

## 2012-11-13 NOTE — ED Notes (Signed)
Insulin verified with Genella Rife RN

## 2012-11-13 NOTE — ED Notes (Signed)
Spoke with David Zamora, the patient's caregiver to notify him of discharge. Caregiver states that staff will be available to pick him up in 2-3 hours.

## 2012-11-13 NOTE — ED Notes (Signed)
D/C instructions given to both patient and group home staff member. Belongings bag x1 returned. Ambulatory without difficulty. No complaints voiced.
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
## 2012-11-13 NOTE — Progress Notes (Signed)
PHARMACIST - PHYSICIAN COMMUNICATION DR:   CONCERNING:  METFORMIN SAFE ADMINISTRATION POLICY  RECOMMENDATION: Metformin has been placed on DISCONTINUE (rejected order) STATUS and should be reordered only after any of the conditions below are ruled out.  Current safety recommendations include avoiding metformin for a minimum of 48 hours after the patient's exposure to intravenous contrast media.  DESCRIPTION:  The Pharmacy Committee has adopted a policy that restricts the use of metformin in hospitalized patients until all the contraindications to administration have been ruled out. Specific contraindications are: [x]  Serum creatinine ? 1.5 for males []  Serum creatinine ? 1.4 for females []  Shock, acute MI, sepsis, hypoxemia, dehydration []  Planned administration of intravenous iodinated contrast media []  Heart Failure patients with low EF []  Acute or chronic metabolic acidosis (including DKA)      Will follow renal function and restart metformin when Scr falls below 1.5  Leone Haven, PharmD 11/12/12

## 2012-11-13 NOTE — BH Assessment (Signed)
[facility] Assessment Progress Note   Spoke with Group Home worker, Delton, and he agreed to take pt back to [facility] and can pick up around 1400 or 1500 today.  Delton has had pt in care since 2009.  Group Home knows pt well.  Will get pt a psych eval with Dr. Jobie Quaker of [facility] next week.

## 2012-11-13 NOTE — ED Provider Notes (Signed)
He has now been evaluated by tele- psychiatry, Dr. Allena Napoleon; who recommends that he be discharged back to his group home. He has reversed the IVC.    Richarda Blade, MD 11/13/12 [phone]
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
## 2013-02-20 ENCOUNTER — Emergency Department: Payer: Self-pay | Admitting: Emergency Medicine

## 2013-02-20 LAB — URINALYSIS, COMPLETE
Bilirubin,UR: NEGATIVE
Blood: NEGATIVE
Nitrite: NEGATIVE
Ph: 7 (ref 4.5–8.0)
Protein: NEGATIVE
Squamous Epithelial: NONE SEEN

## 2013-02-20 LAB — CBC
MCH: 33.6 pg (ref 26.0–34.0)
MCHC: 34.7 g/dL (ref 32.0–36.0)
MCV: 97 fL (ref 80–100)
Platelet: 209 10*3/uL (ref 150–440)

## 2013-02-20 LAB — COMPREHENSIVE METABOLIC PANEL
Chloride: 100 mmol/L (ref 98–107)
Glucose: 418 mg/dL — ABNORMAL HIGH (ref 65–99)
Osmolality: 289 (ref 275–301)
Potassium: 4.1 mmol/L (ref 3.5–5.1)
Sodium: 133 mmol/L — ABNORMAL LOW (ref 136–145)

## 2013-02-22 HISTORY — PX: FINGER FRACTURE SURGERY: SHX638

## 2014-09-05 LAB — HM DIABETES EYE EXAM

## 2014-09-16 ENCOUNTER — Emergency Department
Admission: EM | Admit: 2014-09-16 | Discharge: 2014-09-17 | Disposition: A | Discharge status: Home / Self Care | Payer: Medicare Other | Attending: Emergency Medicine | Admitting: Emergency Medicine

## 2014-09-16 ENCOUNTER — Encounter: Payer: Self-pay | Admitting: Emergency Medicine

## 2014-09-16 DIAGNOSIS — R739 Hyperglycemia, unspecified: Secondary | ICD-10-CM

## 2014-09-16 DIAGNOSIS — I10 Essential (primary) hypertension: Secondary | ICD-10-CM | POA: Insufficient documentation

## 2014-09-16 DIAGNOSIS — Z87891 Personal history of nicotine dependence: Secondary | ICD-10-CM | POA: Diagnosis not present

## 2014-09-16 DIAGNOSIS — E1165 Type 2 diabetes mellitus with hyperglycemia: Secondary | ICD-10-CM | POA: Insufficient documentation

## 2014-09-16 DIAGNOSIS — Z79899 Other long term (current) drug therapy: Secondary | ICD-10-CM | POA: Diagnosis not present

## 2014-09-16 DIAGNOSIS — Z794 Long term (current) use of insulin: Secondary | ICD-10-CM | POA: Diagnosis not present

## 2014-09-16 LAB — URINALYSIS COMPLETE WITH MICROSCOPIC (ARMC ONLY)
BILIRUBIN URINE: NEGATIVE
Glucose, UA: >500 mg/dL — AB
HGB URINE DIPSTICK: NEGATIVE
Leukocytes, UA: NEGATIVE
Nitrite: NEGATIVE
Protein, ur: NEGATIVE mg/dL
Specific Gravity, Urine: 1.017 (ref 1.005–1.030)
pH: 6 (ref 5.0–8.0)

## 2014-09-16 LAB — COMPREHENSIVE METABOLIC PANEL
ALT: 22 U/L (ref 17–63)
AST: 27 U/L (ref 15–41)
Albumin: 3.9 g/dL (ref 3.5–5.0)
Alkaline Phosphatase: 107 U/L (ref 38–126)
Anion gap: 7 (ref 5–15)
BUN: 23 mg/dL — AB (ref 6–20)
CALCIUM: 9.2 mg/dL (ref 8.9–10.3)
CO2: 24 mmol/L (ref 22–32)
Chloride: 108 mmol/L (ref 101–111)
Creatinine, Ser: 1.51 mg/dL — ABNORMAL HIGH (ref 0.61–1.24)
GFR calc Af Amer: 58 mL/min — ABNORMAL LOW (ref >60)
GFR calc non Af Amer: 50 mL/min — ABNORMAL LOW (ref >60)
Glucose, Bld: 351 mg/dL — ABNORMAL HIGH (ref 65–99)
Potassium: 5 mmol/L (ref 3.5–5.1)
Sodium: 139 mmol/L (ref 135–145)
TOTAL PROTEIN: 6.6 g/dL (ref 6.5–8.1)
Total Bilirubin: 0.5 mg/dL (ref 0.3–1.2)

## 2014-09-16 LAB — CBC
HCT: 32.9 % — ABNORMAL LOW (ref 40.0–52.0)
HEMOGLOBIN: 11.3 g/dL — AB (ref 13.0–18.0)
MCH: 34.8 pg — ABNORMAL HIGH (ref 26.0–34.0)
MCHC: 34.5 g/dL (ref 32.0–36.0)
MCV: 100.9 fL — AB (ref 80.0–100.0)
PLATELETS: 184 10*3/uL (ref 150–440)
RBC: 3.26 MIL/uL — AB (ref 4.40–5.90)
RDW: 13.9 % (ref 11.5–14.5)
WBC: 5.1 10*3/uL (ref 3.8–10.6)

## 2014-09-16 LAB — GLUCOSE, CAPILLARY
GLUCOSE-CAPILLARY: 281 mg/dL — AB (ref 65–99)
Glucose-Capillary: 386 mg/dL — ABNORMAL HIGH (ref 65–99)

## 2014-09-16 NOTE — ED Notes (Signed)
EMS pt from [facility] with hyperglycemia; blood sugar 461 by EMS; report pt was taken off insulin and changed to Metformin about a week ago and his blood sugar has been reading high since; pt with no complaints

## 2014-09-16 NOTE — ED Notes (Signed)
Patient brought in by ems from a group home. Group home reports that the patient's blood sugar was elevated at 450. Patient denies any symptoms.

## 2014-09-17 DIAGNOSIS — E1165 Type 2 diabetes mellitus with hyperglycemia: Secondary | ICD-10-CM | POA: Diagnosis not present

## 2014-09-17 LAB — GLUCOSE, CAPILLARY: Glucose-Capillary: 262 mg/dL — ABNORMAL HIGH (ref 65–99)

## 2014-09-17 MED ORDER — SODIUM CHLORIDE 0.9 % IV BOLUS (SEPSIS)
1000.0000 mL | Freq: Once | INTRAVENOUS | Status: AC
  Administered 2014-09-17: 1000 mL via INTRAVENOUS

## 2014-09-17 NOTE — Discharge Instructions (Signed)

## 2014-09-17 NOTE — ED Provider Notes (Signed)
[facility] Emergency Department Provider Note  ____________________________________________  Time seen: Approximately 1:01 AM  I have reviewed the triage vital signs and the nursing notes.   HISTORY  Chief Complaint Hyperglycemia    HPI David Zamora is a 54 y.o. male who comes in today because his sugars have been going up and down a lot. The patient reports that his blood sugars 450s today. The patient reports that it was high just today. He reports that he had hot dogs and fries today. The patient reports that when his blood sugars were high he was feeling some he headed. He reports that this occurred at 74. The patient's staff reports that they check his blood sugars once a day at around 18-1900 after dinner. The patient sugars per the patient typically run in the 80s but they're unsure as 80 but have his book which states his typical blood sugars. When asked if the patient had been sick he reports that he has been vomiting a lot lately. He reports that he has been vomiting 2 times a day but he said he has not been telling his staff. When asked to specify the patient could not clarify when this started and what else has been going on. He also said that he was vomiting because he was fed mustard and he is allergic to mustard. The patient is not a good historian as he looks to his staff to answer half of his questions. The patient reports that he had a bowel movement today but could not determine if it was diarrhea or normal. The staff reports that they brought him in for evaluation of his high blood sugar.   Past Medical History  Diagnosis Date  . Diabetes mellitus   . Depression   . Hypertension   . Mental retardation   . GERD (gastroesophageal reflux disease)     There are no active problems to display for this patient.   No past surgical history on file.  Current Outpatient Rx  Name  Route  Sig  Dispense  Refill  . buPROPion (WELLBUTRIN XL) 150 MG  24 hr tablet   Oral   Take 150 mg by mouth every morning.          . divalproex (DEPAKOTE ER) 250 MG 24 hr tablet   Oral   Take 250-1,000 mg by mouth 2 (two) times daily. 1 tablet in the morning and 4 tablets at bedtime         . econazole nitrate 1 % cream   Topical   Apply 1 application topically daily.         . fenofibrate (TRICOR) 145 MG tablet   Oral   Take 145 mg by mouth every morning.         . insulin detemir (LEVEMIR) 100 UNIT/ML injection   Subcutaneous   Inject 20 Units into the skin at bedtime.           . metFORMIN (GLUCOPHAGE) 500 MG tablet   Oral   Take 500 mg by mouth every morning.          . olmesartan (BENICAR) 20 MG tablet   Oral   Take 20 mg by mouth every morning.         [name] Kitchen omeprazole (PRILOSEC) 20 MG capsule   Oral   Take 20 mg by mouth every morning.          [name] Kitchen PARoxetine (PAXIL) 20 MG tablet   Oral   Take  20 mg by mouth every morning.         [name] Kitchen QUEtiapine (SEROQUEL) 25 MG tablet   Oral   Take 25 mg by mouth at bedtime.         . simvastatin (ZOCOR) 40 MG tablet   Oral   Take 40 mg by mouth at bedtime.           . triamterene-hydrochlorothiazide (MAXZIDE-25) 37.5-25 MG per tablet   Oral   Take 0.5 tablets by mouth every morning.            Allergies Review of patient's allergies indicates no known allergies.  History reviewed. No pertinent family history.  Social History History  Substance Use Topics  . Smoking status: Former Smoker -- 0.25 packs/day    Types: Cigarettes  . Smokeless tobacco: Not on file  . Alcohol Use: No     Comment: Denies    Review of Systems Constitutional: No fever/chills Eyes: No visual changes. ENT: No sore throat. Cardiovascular: Denies chest pain. Respiratory: Denies shortness of breath. Gastrointestinal: Vomiting but no abdominal pain.   Genitourinary: Negative for dysuria. Musculoskeletal: Negative for back pain. Skin: Negative for rash. Neurological: Negative for  headaches, focal weakness or numbness.  10-point ROS otherwise negative.  ____________________________________________   PHYSICAL EXAM:  VITAL SIGNS: ED Triage Vitals  Enc Vitals Group     BP 09/16/14 2122 131/73 mmHg     Pulse Rate 09/16/14 2122 90     Resp 09/16/14 2122 18     Temp 09/16/14 2122 97.7 F (36.5 C)     Temp Source 09/16/14 2122 Oral     SpO2 09/16/14 2122 97 %     Weight 09/16/14 2122 205 lb (92.987 kg)     Height 09/16/14 2122 5\' 10"  (1.778 m)     Head Cir --      Peak Flow --      Pain Score --      Pain Loc --      Pain Edu? --      Excl. in [place]? --     Constitutional: Alert and oriented. Well appearing and in no acute distress. Eyes: Conjunctivae are normal. PERRL. EOMI. Head: Atraumatic. Nose: No congestion/rhinnorhea. Mouth/Throat: Mucous membranes are moist.  Oropharynx non-erythematous. Cardiovascular: Normal rate, regular rhythm. Grossly normal heart sounds.  Good peripheral circulation. Respiratory: Normal respiratory effort.  No retractions. Lungs CTAB. Gastrointestinal: Soft and nontender. No distention. Positive bowel sounds Genitourinary: Deferred Musculoskeletal: No lower extremity tenderness nor edema.  No joint effusions. Neurologic:  Normal speech and language. No gross focal neurologic deficits are appreciated. Speech is normal. No gait instability. Skin:  Skin is warm, dry and intact. No rash noted. Psychiatric: Mood and affect are normal. Speech and behavior are normal.  ____________________________________________   LABS (all labs ordered are listed, but only abnormal results are displayed)  Labs Reviewed  GLUCOSE, CAPILLARY - Abnormal; Notable for the following:    Glucose-Capillary 386 (*)    All other components within normal limits  CBC - Abnormal; Notable for the following:    RBC 3.26 (*)    Hemoglobin 11.3 (*)    HCT 32.9 (*)    MCV 100.9 (*)    MCH 34.8 (*)    All other components within normal limits   COMPREHENSIVE METABOLIC PANEL - Abnormal; Notable for the following:    Glucose, Bld 351 (*)    BUN 23 (*)    Creatinine, Ser 1.51 (*)    GFR  calc non Af Amer 50 (*)    GFR calc Af Amer 58 (*)    All other components within normal limits  URINALYSIS COMPLETEWITH MICROSCOPIC ([facility] ONLY) - Abnormal; Notable for the following:    Color, Urine YELLOW (*)    APPearance CLEAR (*)    Glucose, UA >500 (*)    Ketones, ur TRACE (*)    Bacteria, UA RARE (*)    Squamous Epithelial / LPF 0-5 (*)    All other components within normal limits  GLUCOSE, CAPILLARY - Abnormal; Notable for the following:    Glucose-Capillary 281 (*)    All other components within normal limits  CBG MONITORING, ED   ____________________________________________  EKG  None ____________________________________________  RADIOLOGY  None ____________________________________________   PROCEDURES  Procedure(s) performed: None  Critical Care performed: No  ____________________________________________   INITIAL IMPRESSION / ASSESSMENT AND PLAN / ED COURSE  Pertinent labs & imaging results that were available during my care of the patient were reviewed by me and considered in my medical decision making (see chart for details).  This is a 54 year old male with a history of schizophrenia and diabetes who lives in a group home and comes in today with high blood sugars in the 450s. The patient is currently taking metformin as he did have his medications changed recently. In the emergency department here his blood sugars are improving with sugars in the 200s to 300s. I will give the patient some normal saline and reassess his blood sugar. If his blood sugar remains under 300 I will discharge the patient home to follow-up with his primary care physician.  After a liter of normal saline the patient's blood sugar is 262. I will discharge the patient home to follow-up with his primary care physician for further treatment  and management of his hyperglycemia. ____________________________________________   FINAL CLINICAL IMPRESSION(S) / ED DIAGNOSES  Final diagnoses:  Hyperglycemia       Loney Hering, MD 09/17/14 0200

## 2015-05-08 DIAGNOSIS — B351 Tinea unguium: Secondary | ICD-10-CM | POA: Diagnosis not present

## 2015-05-08 DIAGNOSIS — E1142 Type 2 diabetes mellitus with diabetic polyneuropathy: Secondary | ICD-10-CM | POA: Diagnosis not present

## 2015-05-08 DIAGNOSIS — L851 Acquired keratosis [keratoderma] palmaris et plantaris: Secondary | ICD-10-CM | POA: Diagnosis not present

## 2015-06-12 DIAGNOSIS — F70 Mild intellectual disabilities: Secondary | ICD-10-CM | POA: Diagnosis not present

## 2015-07-04 DIAGNOSIS — E1165 Type 2 diabetes mellitus with hyperglycemia: Secondary | ICD-10-CM | POA: Diagnosis not present

## 2015-07-04 DIAGNOSIS — F319 Bipolar disorder, unspecified: Secondary | ICD-10-CM | POA: Diagnosis not present

## 2015-07-04 DIAGNOSIS — I10 Essential (primary) hypertension: Secondary | ICD-10-CM | POA: Diagnosis not present

## 2015-08-17 DIAGNOSIS — B351 Tinea unguium: Secondary | ICD-10-CM | POA: Diagnosis not present

## 2015-08-17 DIAGNOSIS — L851 Acquired keratosis [keratoderma] palmaris et plantaris: Secondary | ICD-10-CM | POA: Diagnosis not present

## 2015-08-17 DIAGNOSIS — E1142 Type 2 diabetes mellitus with diabetic polyneuropathy: Secondary | ICD-10-CM | POA: Diagnosis not present

## 2015-08-20 ENCOUNTER — Encounter: Payer: Self-pay | Admitting: Gastroenterology

## 2015-09-03 DIAGNOSIS — F70 Mild intellectual disabilities: Secondary | ICD-10-CM | POA: Diagnosis not present

## 2015-10-26 DIAGNOSIS — L851 Acquired keratosis [keratoderma] palmaris et plantaris: Secondary | ICD-10-CM | POA: Diagnosis not present

## 2015-10-26 DIAGNOSIS — E1142 Type 2 diabetes mellitus with diabetic polyneuropathy: Secondary | ICD-10-CM | POA: Diagnosis not present

## 2015-10-26 DIAGNOSIS — B351 Tinea unguium: Secondary | ICD-10-CM | POA: Diagnosis not present

## 2015-10-29 DIAGNOSIS — F70 Mild intellectual disabilities: Secondary | ICD-10-CM | POA: Diagnosis not present

## 2015-10-30 DIAGNOSIS — F319 Bipolar disorder, unspecified: Secondary | ICD-10-CM | POA: Diagnosis not present

## 2015-10-30 DIAGNOSIS — E1165 Type 2 diabetes mellitus with hyperglycemia: Secondary | ICD-10-CM | POA: Diagnosis not present

## 2015-12-24 ENCOUNTER — Inpatient Hospital Stay (HOSPITAL_COMMUNITY)
Admission: EM | Admit: 2015-12-24 | Discharge: 2015-12-27 | DRG: 641 | Disposition: A | Discharge status: Home / Self Care | Payer: Medicare Other | Attending: Internal Medicine | Admitting: Internal Medicine

## 2015-12-24 ENCOUNTER — Encounter (HOSPITAL_COMMUNITY): Payer: Self-pay | Admitting: Emergency Medicine

## 2015-12-24 DIAGNOSIS — IMO0002 Reserved for concepts with insufficient information to code with codable children: Secondary | ICD-10-CM | POA: Diagnosis present

## 2015-12-24 DIAGNOSIS — I152 Hypertension secondary to endocrine disorders: Secondary | ICD-10-CM | POA: Diagnosis present

## 2015-12-24 DIAGNOSIS — Z794 Long term (current) use of insulin: Secondary | ICD-10-CM | POA: Insufficient documentation

## 2015-12-24 DIAGNOSIS — K219 Gastro-esophageal reflux disease without esophagitis: Secondary | ICD-10-CM | POA: Diagnosis present

## 2015-12-24 DIAGNOSIS — N179 Acute kidney failure, unspecified: Secondary | ICD-10-CM | POA: Diagnosis not present

## 2015-12-24 DIAGNOSIS — F70 Mild intellectual disabilities: Secondary | ICD-10-CM | POA: Diagnosis present

## 2015-12-24 DIAGNOSIS — N183 Chronic kidney disease, stage 3 unspecified: Secondary | ICD-10-CM | POA: Insufficient documentation

## 2015-12-24 DIAGNOSIS — Z7984 Long term (current) use of oral hypoglycemic drugs: Secondary | ICD-10-CM

## 2015-12-24 DIAGNOSIS — E875 Hyperkalemia: Secondary | ICD-10-CM | POA: Diagnosis present

## 2015-12-24 DIAGNOSIS — D649 Anemia, unspecified: Secondary | ICD-10-CM | POA: Diagnosis present

## 2015-12-24 DIAGNOSIS — E1122 Type 2 diabetes mellitus with diabetic chronic kidney disease: Secondary | ICD-10-CM | POA: Diagnosis present

## 2015-12-24 DIAGNOSIS — E871 Hypo-osmolality and hyponatremia: Secondary | ICD-10-CM | POA: Diagnosis present

## 2015-12-24 DIAGNOSIS — I129 Hypertensive chronic kidney disease with stage 1 through stage 4 chronic kidney disease, or unspecified chronic kidney disease: Secondary | ICD-10-CM | POA: Diagnosis present

## 2015-12-24 DIAGNOSIS — R1114 Bilious vomiting: Secondary | ICD-10-CM | POA: Diagnosis not present

## 2015-12-24 DIAGNOSIS — F79 Unspecified intellectual disabilities: Secondary | ICD-10-CM | POA: Diagnosis not present

## 2015-12-24 DIAGNOSIS — E872 Acidosis: Secondary | ICD-10-CM | POA: Diagnosis present

## 2015-12-24 DIAGNOSIS — N19 Unspecified kidney failure: Secondary | ICD-10-CM | POA: Diagnosis not present

## 2015-12-24 DIAGNOSIS — Z87891 Personal history of nicotine dependence: Secondary | ICD-10-CM | POA: Diagnosis not present

## 2015-12-24 DIAGNOSIS — Z79899 Other long term (current) drug therapy: Secondary | ICD-10-CM | POA: Diagnosis not present

## 2015-12-24 DIAGNOSIS — R197 Diarrhea, unspecified: Secondary | ICD-10-CM | POA: Diagnosis not present

## 2015-12-24 DIAGNOSIS — E1165 Type 2 diabetes mellitus with hyperglycemia: Secondary | ICD-10-CM | POA: Diagnosis present

## 2015-12-24 DIAGNOSIS — F329 Major depressive disorder, single episode, unspecified: Secondary | ICD-10-CM | POA: Diagnosis not present

## 2015-12-24 DIAGNOSIS — Z7982 Long term (current) use of aspirin: Secondary | ICD-10-CM

## 2015-12-24 DIAGNOSIS — E119 Type 2 diabetes mellitus without complications: Secondary | ICD-10-CM | POA: Diagnosis present

## 2015-12-24 DIAGNOSIS — E1129 Type 2 diabetes mellitus with other diabetic kidney complication: Secondary | ICD-10-CM | POA: Diagnosis not present

## 2015-12-24 DIAGNOSIS — E1159 Type 2 diabetes mellitus with other circulatory complications: Secondary | ICD-10-CM | POA: Diagnosis present

## 2015-12-24 DIAGNOSIS — I10 Essential (primary) hypertension: Secondary | ICD-10-CM

## 2015-12-24 DIAGNOSIS — N189 Chronic kidney disease, unspecified: Secondary | ICD-10-CM

## 2015-12-24 DIAGNOSIS — R1110 Vomiting, unspecified: Secondary | ICD-10-CM | POA: Diagnosis not present

## 2015-12-24 DIAGNOSIS — R899 Unspecified abnormal finding in specimens from other organs, systems and tissues: Secondary | ICD-10-CM | POA: Diagnosis not present

## 2015-12-24 DIAGNOSIS — F32A Depression, unspecified: Secondary | ICD-10-CM | POA: Diagnosis present

## 2015-12-24 LAB — CBC WITH DIFFERENTIAL/PLATELET
BASOS ABS: 0 10*3/uL (ref 0.0–0.1)
Basophils Relative: 0 %
Eosinophils Absolute: 0 10*3/uL (ref 0.0–0.7)
Eosinophils Relative: 0 %
HEMATOCRIT: 40.6 % (ref 39.0–52.0)
Hemoglobin: 12.9 g/dL — ABNORMAL LOW (ref 13.0–17.0)
LYMPHS ABS: 0.8 10*3/uL (ref 0.7–4.0)
Lymphocytes Relative: 10 %
MCH: 31.8 pg (ref 26.0–34.0)
MCHC: 31.8 g/dL (ref 30.0–36.0)
MCV: 100 fL (ref 78.0–100.0)
Monocytes Absolute: 0.4 10*3/uL (ref 0.1–1.0)
Monocytes Relative: 5 %
NEUTROS ABS: 7.1 10*3/uL (ref 1.7–7.7)
Neutrophils Relative %: 85 %
PLATELETS: 228 10*3/uL (ref 150–400)
RBC: 4.06 MIL/uL — ABNORMAL LOW (ref 4.22–5.81)
RDW: 14 % (ref 11.5–15.5)
WBC: 8.3 10*3/uL (ref 4.0–10.5)

## 2015-12-24 LAB — RENAL FUNCTION PANEL
ALBUMIN: 5 g/dL (ref 3.5–5.0)
ALBUMIN: 4.2 g/dL (ref 3.5–5.0)
ALBUMIN: 3.7 g/dL (ref 3.5–5.0)
ANION GAP: 3 — AB (ref 5–15)
Anion gap: 6 (ref 5–15)
Anion gap: 6 (ref 5–15)
BUN: 54 mg/dL — AB (ref 6–20)
BUN: 54 mg/dL — ABNORMAL HIGH (ref 6–20)
BUN: 53 mg/dL — AB (ref 6–20)
CALCIUM: 10.1 mg/dL (ref 8.9–10.3)
CALCIUM: 8.8 mg/dL — AB (ref 8.9–10.3)
CALCIUM: 8.9 mg/dL (ref 8.9–10.3)
CO2: 20 mmol/L — AB (ref 22–32)
CO2: 17 mmol/L — AB (ref 22–32)
CO2: 20 mmol/L — ABNORMAL LOW (ref 22–32)
CREATININE: 2.36 mg/dL — AB (ref 0.61–1.24)
Chloride: 106 mmol/L (ref 101–111)
Chloride: 111 mmol/L (ref 101–111)
Chloride: 107 mmol/L (ref 101–111)
Creatinine, Ser: 2.81 mg/dL — ABNORMAL HIGH (ref 0.61–1.24)
Creatinine, Ser: 2.46 mg/dL — ABNORMAL HIGH (ref 0.61–1.24)
GFR calc Af Amer: 27 mL/min — ABNORMAL LOW (ref >60)
GFR calc Af Amer: 34 mL/min — ABNORMAL LOW (ref >60)
GFR calc non Af Amer: 23 mL/min — ABNORMAL LOW (ref >60)
GFR calc non Af Amer: 27 mL/min — ABNORMAL LOW (ref >60)
GFR, EST AFRICAN AMERICAN: 32 mL/min — AB (ref >60)
GFR, EST NON AFRICAN AMERICAN: 29 mL/min — AB (ref >60)
GLUCOSE: 400 mg/dL — AB (ref 65–99)
Glucose, Bld: 275 mg/dL — ABNORMAL HIGH (ref 65–99)
Glucose, Bld: 227 mg/dL — ABNORMAL HIGH (ref 65–99)
PHOSPHORUS: 1.7 mg/dL — AB (ref 2.5–4.6)
PHOSPHORUS: 1.3 mg/dL — AB (ref 2.5–4.6)
PHOSPHORUS: 1.4 mg/dL — AB (ref 2.5–4.6)
POTASSIUM: 7.9 mmol/L — AB (ref 3.5–5.1)
POTASSIUM: 6.1 mmol/L — AB (ref 3.5–5.1)
Potassium: 5.7 mmol/L — ABNORMAL HIGH (ref 3.5–5.1)
SODIUM: 132 mmol/L — AB (ref 135–145)
SODIUM: 131 mmol/L — AB (ref 135–145)
Sodium: 133 mmol/L — ABNORMAL LOW (ref 135–145)

## 2015-12-24 LAB — HEPATIC FUNCTION PANEL
ALBUMIN: 5 g/dL (ref 3.5–5.0)
ALT: 33 U/L (ref 17–63)
AST: 29 U/L (ref 15–41)
Alkaline Phosphatase: 60 U/L (ref 38–126)
Bilirubin, Direct: 0.1 mg/dL (ref 0.1–0.5)
Indirect Bilirubin: 0.8 mg/dL (ref 0.3–0.9)
TOTAL PROTEIN: 8.5 g/dL — AB (ref 6.5–8.1)
Total Bilirubin: 0.9 mg/dL (ref 0.3–1.2)

## 2015-12-24 LAB — BASIC METABOLIC PANEL
ANION GAP: 9 (ref 5–15)
BUN: 54 mg/dL — ABNORMAL HIGH (ref 6–20)
CO2: 19 mmol/L — ABNORMAL LOW (ref 22–32)
Calcium: 10.2 mg/dL (ref 8.9–10.3)
Chloride: 106 mmol/L (ref 101–111)
Creatinine, Ser: 2.7 mg/dL — ABNORMAL HIGH (ref 0.61–1.24)
GFR calc Af Amer: 28 mL/min — ABNORMAL LOW (ref >60)
GFR calc non Af Amer: 25 mL/min — ABNORMAL LOW (ref >60)
GLUCOSE: 395 mg/dL — AB (ref 65–99)
Potassium: 7.7 mmol/L (ref 3.5–5.1)
SODIUM: 134 mmol/L — AB (ref 135–145)

## 2015-12-24 LAB — URINALYSIS, ROUTINE W REFLEX MICROSCOPIC
Glucose, UA: 100 mg/dL — AB
Hgb urine dipstick: NEGATIVE
LEUKOCYTES UA: NEGATIVE
Nitrite: NEGATIVE
PROTEIN: 100 mg/dL — AB
Specific Gravity, Urine: >1.03 — ABNORMAL HIGH (ref 1.005–1.030)
pH: 5.5 (ref 5.0–8.0)

## 2015-12-24 LAB — POTASSIUM: Potassium: 8.2 mmol/L (ref 3.5–5.1)

## 2015-12-24 LAB — BLOOD GAS, ARTERIAL
ACID-BASE DEFICIT: 9.9 mmol/L — AB (ref 0.0–2.0)
BICARBONATE: 16.6 mmol/L — AB (ref 20.0–28.0)
DRAWN BY: 23534
FIO2: 0.21
O2 Content: 21 L/min
O2 Saturation: 96 %
pCO2 arterial: 32.2 mmHg (ref 32.0–48.0)
pH, Arterial: 7.298 — ABNORMAL LOW (ref 7.350–7.450)
pO2, Arterial: 93.2 mmHg (ref 83.0–108.0)

## 2015-12-24 LAB — URINE MICROSCOPIC-ADD ON: RBC / HPF: NONE SEEN RBC/hpf (ref 0–5)

## 2015-12-24 LAB — CREATININE, URINE, RANDOM: CREATININE, URINE: 275.32 mg/dL

## 2015-12-24 LAB — LIPASE, BLOOD: Lipase: 63 U/L — ABNORMAL HIGH (ref 11–51)

## 2015-12-24 LAB — SODIUM, URINE, RANDOM: SODIUM UR: 26 mmol/L

## 2015-12-24 MED ORDER — SODIUM CHLORIDE 0.9 % IV SOLN
INTRAVENOUS | Status: AC
  Filled 2015-12-24: qty 10

## 2015-12-24 MED ORDER — SODIUM CHLORIDE 0.9% FLUSH
3.0000 mL | Freq: Two times a day (BID) | INTRAVENOUS | Status: DC
  Administered 2015-12-24 – 2015-12-26 (×2): 3 mL via INTRAVENOUS

## 2015-12-24 MED ORDER — SODIUM POLYSTYRENE SULFONATE 15 GM/60ML PO SUSP
30.0000 g | ORAL | Status: AC
  Administered 2015-12-24 – 2015-12-25 (×2): 30 g via ORAL
  Filled 2015-12-24 (×2): qty 120

## 2015-12-24 MED ORDER — SODIUM POLYSTYRENE SULFONATE 15 GM/60ML PO SUSP
30.0000 g | Freq: Once | ORAL | Status: AC
  Administered 2015-12-24: 30 g via ORAL
  Filled 2015-12-24: qty 120

## 2015-12-24 MED ORDER — DIVALPROEX SODIUM ER 250 MG PO TB24: 250.0000 mg | ORAL_TABLET | Freq: Two times a day (BID) | ORAL | Status: DC

## 2015-12-24 MED ORDER — INSULIN ASPART 100 UNIT/ML ~~LOC~~ SOLN
10.0000 [IU] | SUBCUTANEOUS | Status: DC
Start: 2015-12-24 — End: 2015-12-25
  Administered 2015-12-24 – 2015-12-25 (×4): 10 [IU] via SUBCUTANEOUS

## 2015-12-24 MED ORDER — HYDRALAZINE HCL 20 MG/ML IJ SOLN: 10.0000 mg | INTRAMUSCULAR | Status: DC | PRN

## 2015-12-24 MED ORDER — SIMVASTATIN 20 MG PO TABS
40.0000 mg | ORAL_TABLET | Freq: Every day | ORAL | Status: DC
Start: 2015-12-24 — End: 2015-12-27
  Administered 2015-12-24 – 2015-12-26 (×3): 40 mg via ORAL
  Filled 2015-12-24 (×3): qty 2

## 2015-12-24 MED ORDER — SODIUM CHLORIDE 0.9 % IV SOLN: Freq: Once | INTRAVENOUS | Status: DC

## 2015-12-24 MED ORDER — ENOXAPARIN SODIUM 40 MG/0.4ML ~~LOC~~ SOLN
40.0000 mg | SUBCUTANEOUS | Status: DC
  Administered 2015-12-24 – 2015-12-26 (×3): 40 mg via SUBCUTANEOUS
  Filled 2015-12-24 (×3): qty 0.4

## 2015-12-24 MED ORDER — BUPROPION HCL ER (XL) 150 MG PO TB24
150.0000 mg | ORAL_TABLET | Freq: Every morning | ORAL | Status: DC
  Administered 2015-12-25 – 2015-12-27 (×3): 150 mg via ORAL
  Filled 2015-12-24 (×4): qty 1

## 2015-12-24 MED ORDER — INSULIN ASPART 100 UNIT/ML IV SOLN
10.0000 [IU] | Freq: Once | INTRAVENOUS | Status: AC
  Administered 2015-12-24: 10 [IU] via INTRAVENOUS

## 2015-12-24 MED ORDER — FENOFIBRATE 160 MG PO TABS
160.0000 mg | ORAL_TABLET | Freq: Every day | ORAL | Status: DC
  Administered 2015-12-25 – 2015-12-26 (×2): 160 mg via ORAL
  Filled 2015-12-24 (×4): qty 1

## 2015-12-24 MED ORDER — ONDANSETRON HCL 4 MG PO TABS: 4.0000 mg | ORAL_TABLET | Freq: Four times a day (QID) | ORAL | Status: DC | PRN

## 2015-12-24 MED ORDER — ASPIRIN EC 81 MG PO TBEC
81.0000 mg | DELAYED_RELEASE_TABLET | Freq: Every day | ORAL | Status: DC
Start: 2015-12-25 — End: 2015-12-27
  Administered 2015-12-25 – 2015-12-27 (×3): 81 mg via ORAL
  Filled 2015-12-24 (×3): qty 1

## 2015-12-24 MED ORDER — PANTOPRAZOLE SODIUM 40 MG PO TBEC
40.0000 mg | DELAYED_RELEASE_TABLET | Freq: Every day | ORAL | Status: DC
Start: 2015-12-25 — End: 2015-12-27
  Administered 2015-12-25 – 2015-12-27 (×3): 40 mg via ORAL
  Filled 2015-12-24 (×3): qty 1

## 2015-12-24 MED ORDER — ACETAMINOPHEN 650 MG RE SUPP
650.0000 mg | Freq: Four times a day (QID) | RECTAL | Status: DC | PRN
Start: 2015-12-24 — End: 2015-12-27

## 2015-12-24 MED ORDER — SODIUM CHLORIDE 0.9 % IV SOLN
INTRAVENOUS | Status: DC
  Administered 2015-12-24 – 2015-12-27 (×4): via INTRAVENOUS

## 2015-12-24 MED ORDER — SODIUM POLYSTYRENE SULFONATE 15 GM/60ML PO SUSP: 30.0000 g | ORAL | Status: DC

## 2015-12-24 MED ORDER — DIVALPROEX SODIUM ER 250 MG PO TB24
250.0000 mg | ORAL_TABLET | Freq: Every day | ORAL | Status: DC
  Administered 2015-12-25 – 2015-12-27 (×3): 250 mg via ORAL
  Filled 2015-12-24 (×4): qty 1

## 2015-12-24 MED ORDER — DEXTROSE 50 % IV SOLN
1.0000 | Freq: Once | INTRAVENOUS | Status: AC
  Administered 2015-12-24: 50 mL via INTRAVENOUS

## 2015-12-24 MED ORDER — CALCIUM GLUCONATE 10 % IV SOLN
1.0000 g | Freq: Once | INTRAVENOUS | Status: AC
  Administered 2015-12-24: 1 g via INTRAVENOUS
  Filled 2015-12-24: qty 10

## 2015-12-24 MED ORDER — FUROSEMIDE 10 MG/ML IJ SOLN: 60.0000 mg | Freq: Two times a day (BID) | INTRAMUSCULAR | Status: DC

## 2015-12-24 MED ORDER — PAROXETINE HCL 20 MG PO TABS
20.0000 mg | ORAL_TABLET | Freq: Every morning | ORAL | Status: DC
  Administered 2015-12-25 – 2015-12-27 (×3): 20 mg via ORAL
  Filled 2015-12-24 (×3): qty 1

## 2015-12-24 MED ORDER — DIVALPROEX SODIUM ER 500 MG PO TB24
1000.0000 mg | ORAL_TABLET | Freq: Every day | ORAL | Status: DC
  Administered 2015-12-24 – 2015-12-26 (×3): 1000 mg via ORAL
  Filled 2015-12-24 (×3): qty 2

## 2015-12-24 MED ORDER — FUROSEMIDE 10 MG/ML IJ SOLN
60.0000 mg | Freq: Once | INTRAMUSCULAR | Status: AC
  Administered 2015-12-24: 60 mg via INTRAVENOUS
  Filled 2015-12-24: qty 6

## 2015-12-24 MED ORDER — TAMSULOSIN HCL 0.4 MG PO CAPS
0.4000 mg | ORAL_CAPSULE | Freq: Every day | ORAL | Status: DC
  Administered 2015-12-25 – 2015-12-27 (×3): 0.4 mg via ORAL
  Filled 2015-12-24 (×4): qty 1

## 2015-12-24 MED ORDER — POLYVINYL ALCOHOL 1.4 % OP SOLN
1.0000 [drp] | Freq: Four times a day (QID) | OPHTHALMIC | Status: DC
  Administered 2015-12-25 – 2015-12-27 (×7): 1 [drp] via OPHTHALMIC
  Filled 2015-12-24 (×3): qty 15

## 2015-12-24 MED ORDER — BUSPIRONE HCL 5 MG PO TABS
10.0000 mg | ORAL_TABLET | Freq: Two times a day (BID) | ORAL | Status: DC
  Administered 2015-12-24 – 2015-12-27 (×6): 10 mg via ORAL
  Filled 2015-12-24 (×6): qty 2

## 2015-12-24 MED ORDER — QUETIAPINE FUMARATE ER 300 MG PO TB24
300.0000 mg | ORAL_TABLET | Freq: Every day | ORAL | Status: DC
  Administered 2015-12-24 – 2015-12-26 (×3): 300 mg via ORAL
  Filled 2015-12-24 (×4): qty 1

## 2015-12-24 MED ORDER — SODIUM CHLORIDE 0.9 % IV BOLUS (SEPSIS)
1000.0000 mL | Freq: Once | INTRAVENOUS | Status: AC
Start: 2015-12-24 — End: 2015-12-24
  Administered 2015-12-24: 1000 mL via INTRAVENOUS

## 2015-12-24 MED ORDER — ACETAMINOPHEN 325 MG PO TABS
650.0000 mg | ORAL_TABLET | Freq: Four times a day (QID) | ORAL | Status: DC | PRN
  Administered 2015-12-24 – 2015-12-25 (×2): 650 mg via ORAL
  Filled 2015-12-24 (×2): qty 2

## 2015-12-24 MED ORDER — SODIUM BICARBONATE 8.4 % IV SOLN
INTRAVENOUS | Status: AC
  Filled 2015-12-24: qty 50

## 2015-12-24 MED ORDER — ONDANSETRON HCL 4 MG/2ML IJ SOLN: 4.0000 mg | Freq: Four times a day (QID) | INTRAMUSCULAR | Status: DC | PRN

## 2015-12-24 MED ORDER — ONDANSETRON HCL 4 MG/2ML IJ SOLN
4.0000 mg | Freq: Once | INTRAMUSCULAR | Status: AC
  Administered 2015-12-24: 4 mg via INTRAVENOUS
  Filled 2015-12-24: qty 2

## 2015-12-24 MED ORDER — LACTATED RINGERS IV BOLUS (SEPSIS)
2000.0000 mL | Freq: Once | INTRAVENOUS | Status: AC
  Administered 2015-12-24: 2000 mL via INTRAVENOUS

## 2015-12-24 MED ORDER — DEXTROSE 50 % IV SOLN
INTRAVENOUS | Status: AC
  Filled 2015-12-24: qty 50

## 2015-12-24 MED ORDER — SODIUM BICARBONATE 8.4 % IV SOLN
50.0000 meq | Freq: Once | INTRAVENOUS | Status: AC
  Administered 2015-12-24: 50 meq via INTRAVENOUS
  Filled 2015-12-24: qty 50

## 2015-12-24 NOTE — Consult Note (Addendum)
Reason for Consult: Hyperkalemia and renal failure Referring Physician: [facility]  David Zamora is an 55 y.o. male.  HPI: He is a patient with history of hypertension, diabetes, chronic renal failure presently went to urgent care because of some nausea and vomiting the last 3 days. When he was evaluated and patient was found to have hyperkalemia hence referred to the emergency room. Presently patient states that his nausea and vomiting is better. He doesn't have any abdominal pain. He has one episode of diarrhea. He denies any difficulty breathing. Patient denies any previous history of renal failure however from the blood work available in the hospital patient seems to chronic renal failure possibly stage III.  Past Medical History:  Diagnosis Date  . Depression   . Diabetes mellitus   . GERD (gastroesophageal reflux disease)   . Hypertension   . Mental retardation     Past Surgical History:  Procedure Laterality Date  . FINGER FRACTURE SURGERY  02/22/2013    History reviewed. No pertinent family history.  Social History:  reports that he quit smoking about 5 years ago. His smoking use included Cigarettes. He smoked 0.25 packs per day. He has never used smokeless tobacco. He reports that he does not drink alcohol or use drugs.  Allergies: No Known Allergies  Medications: I have reviewed the patient's current medications.  Results for orders placed or performed during the hospital encounter of 12/24/15 (from the past 48 hour(s))  CBC with Differential     Status: Abnormal   Collection Time: 12/24/15  3:30 PM  Result Value Ref Range   WBC 8.3 4.0 - 10.5 K/uL   RBC 4.06 (L) 4.22 - 5.81 MIL/uL   Hemoglobin 12.9 (L) 13.0 - 17.0 g/dL   HCT 40.6 39.0 - 52.0 %   MCV 100.0 78.0 - 100.0 fL   MCH 31.8 26.0 - 34.0 pg   MCHC 31.8 30.0 - 36.0 g/dL   RDW 14.0 11.5 - 15.5 %   Platelets 228 150 - 400 K/uL   Neutrophils Relative % 85 %   Lymphocytes Relative 10 %   Monocytes  Relative 5 %   Eosinophils Relative 0 %   Basophils Relative 0 %   Neutro Abs 7.1 1.7 - 7.7 K/uL   Lymphs Abs 0.8 0.7 - 4.0 K/uL   Monocytes Absolute 0.4 0.1 - 1.0 K/uL   Eosinophils Absolute 0.0 0.0 - 0.7 K/uL   Basophils Absolute 0.0 0.0 - 0.1 K/uL   RBC Morphology POLYCHROMASIA PRESENT    Smear Review LARGE PLATELETS PRESENT     Comment: GIANT PLATELETS SEEN  Basic metabolic panel     Status: Abnormal   Collection Time: 12/24/15  3:30 PM  Result Value Ref Range   Sodium 134 (L) 135 - 145 mmol/L   Potassium 7.7 (HH) 3.5 - 5.1 mmol/L    Comment: CRITICAL RESULT CALLED TO, READ BACK BY AND VERIFIED WITH: CARDWELL,L AT 1610 ON 12/24/2015 BY ISLEY,B    Chloride 106 101 - 111 mmol/L   CO2 19 (L) 22 - 32 mmol/L   Glucose, Bld 395 (H) 65 - 99 mg/dL   BUN 54 (H) 6 - 20 mg/dL   Creatinine, Ser 2.70 (H) 0.61 - 1.24 mg/dL   Calcium 10.2 8.9 - 10.3 mg/dL   GFR calc non Af Amer 25 (L) >60 mL/min   GFR calc Af Amer 28 (L) >60 mL/min    Comment: (NOTE) The eGFR has been calculated using the CKD EPI equation.  This calculation has not been validated in all clinical situations. eGFR's persistently <60 mL/min signify possible Chronic Kidney Disease.    Anion gap 9 5 - 15  Hepatic function panel     Status: Abnormal   Collection Time: 12/24/15  3:30 PM  Result Value Ref Range   Total Protein 8.5 (H) 6.5 - 8.1 g/dL   Albumin 5.0 3.5 - 5.0 g/dL   AST 29 15 - 41 U/L   ALT 33 17 - 63 U/L   Alkaline Phosphatase 60 38 - 126 U/L   Total Bilirubin 0.9 0.3 - 1.2 mg/dL   Bilirubin, Direct 0.1 0.1 - 0.5 mg/dL   Indirect Bilirubin 0.8 0.3 - 0.9 mg/dL  Lipase, blood     Status: Abnormal   Collection Time: 12/24/15  3:30 PM  Result Value Ref Range   Lipase 63 (H) 11 - 51 U/L  Potassium     Status: Abnormal   Collection Time: 12/24/15  4:58 PM  Result Value Ref Range   Potassium 8.2 (HH) 3.5 - 5.1 mmol/L    Comment: CRITICAL RESULT CALLED TO, READ BACK BY AND VERIFIED WITH: EDWARD,E AT 1800 ON  12/24/2015 BY ISLEY,B   Blood gas, arterial     Status: Abnormal   Collection Time: 12/24/15  7:00 PM  Result Value Ref Range   FIO2 0.21    O2 Content 21.0 L/min   Delivery systems ROOM AIR    pH, Arterial 7.298 (L) 7.350 - 7.450   pCO2 arterial 32.2 32.0 - 48.0 mmHg   pO2, Arterial 93.2 83.0 - 108.0 mmHg   Bicarbonate 16.6 (L) 20.0 - 28.0 mmol/L   Acid-base deficit 9.9 (H) 0.0 - 2.0 mmol/L   O2 Saturation 96.0 %   Collection site RIGHT RADIAL    Drawn by [id number]    Sample type ARTERIAL DRAW    Allens test (pass/fail) PASS PASS    No results found.  Review of Systems  Constitutional: Negative for chills and fever.  Respiratory: Negative for shortness of breath.   Cardiovascular: Negative for orthopnea and leg swelling.  Gastrointestinal: Positive for abdominal pain, nausea and vomiting. Negative for diarrhea.  Genitourinary: Negative for frequency and urgency.   Blood pressure 108/62, pulse 76, temperature 97.7 F (36.5 C), temperature source Oral, resp. rate 20, height _0  (1.727 m), weight 93 kg (205 lb), SpO2 98 %. Physical Exam  Constitutional: He is oriented to person, place, and time. No distress.  Eyes: No scleral icterus.  Neck: No JVD present.  Cardiovascular: Regular rhythm.   Respiratory: He has no wheezes.  GI: He exhibits no distension. There is no tenderness. There is no rebound.  Musculoskeletal: He exhibits no edema.  Neurological: He is alert and oriented to person, place, and time.    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 Presently patient is given insulin and calcium gluconate. Patient has received Keyaxalate 30 g 1 dose without any problem. EKG doesn't show significant change consistent with hyperkalemia Problem #2 renal failure possibly acute on chronic.  Creatinine 1.10 4 years  ago Creatinine 1.503 years ago Creatinine 1.60 on 02/20/2013 Creatinine 1.51 with a GFR of 50/55 on 09/21/2014. The present increase in creatinine could be secondary to natural progression of the disease however prerenal syndrome/ATN/ARBS can contribute to worsening of his renal failure. Problem #3 nausea and vomiting: Etiology not clear however his lipase is slightly high  could be secondary to pancreatitis Problem #4 hypertension: His blood pressure is reasonably controlled Problem #5 history of depression Problem #6 diabetes poorly controlled Problem #7 low CO2 possibly metabolic. Problem #8 elevated lipase: Plan: 1] Normal saline at 145 mL per hour 2] Lasix 60 mg IV twice a day 3] I discussed with him about possibility of dialysis just for his potassium. Presently he seems to be tolerating by mouth intake. Patient also preferred to continue with medical treatment. As far as his renal function is concerned patient doesn't require dialysis as long as his potassium is corrected. 4] will continue with Kayexalate 30 g every 4 hours 3 doses 5] if patient has a problem of taking it then we'll consider dialysis. 6] will check his renal panel every 2 hours 3    Casen Pryor S 12/24/2015, 7:18 PM

## 2015-12-24 NOTE — ED Notes (Signed)
CRITICAL VALUE ALERT  Critical value received:  K 7.7  Date of notification:  12/24/2015  Time of notification:  1618  Critical value read back:Yes.    Nurse who received alert:  [facility] RN  MD notified (1st page):  Dr. Roderic Palau  Time of first page:  1618  MD notified (2nd page):  Time of second page:  Responding MD:  Dr. Roderic Palau  Time MD responded:  [phone]

## 2015-12-24 NOTE — ED Notes (Signed)
CRITICAL VALUE ALERT  Critical value received:  Potassium 8.2  Date of notification:  12/24/15  Time of notification:  1802  Critical value read back:Yes.    Nurse who received alert:  c Jeree Delcid rn  MD notified (1st page):    Time of first page:    MD notified (2nd page):  Time of second page:  Responding MD:  Dr Roderic Palau  Time MD responded:  [phone]

## 2015-12-24 NOTE — ED Provider Notes (Signed)
[facility] DEPT Provider Note   CSN: [id number] Arrival date & time: 12/24/15  1505     History   Chief Complaint Chief Complaint  Patient presents with  . Abdominal Pain    HPI David Zamora is a 55 y.o. male.  Patient complains of vomiting today mild abdominal discomfort   The history is provided by the patient and the nursing home. No language interpreter was used.  Abdominal Pain   This is a new problem. The problem occurs rarely. The problem has been resolved. Associated with: Nothing. The pain is located in the generalized abdominal region. The quality of the pain is aching. The pain is at a severity of 1/10. The pain is mild. Associated symptoms include anorexia. Pertinent negatives include fever, diarrhea, frequency, hematuria and headaches. Nothing aggravates the symptoms. Nothing relieves the symptoms. Past workup does not include GI consult. His past medical history does not include PUD.    Past Medical History:  Diagnosis Date  . Depression   . Diabetes mellitus   . GERD (gastroesophageal reflux disease)   . Hypertension   . Mental retardation     Patient Active Problem List   Diagnosis Date Noted  . Hyperkalemia 12/24/2015    History reviewed. No pertinent surgical history.     Home Medications    Prior to Admission medications   Medication Sig Start Date End Date Taking? Authorizing Provider  aspirin EC 81 MG tablet Take 81 mg by mouth daily.   Yes Historical Provider, MD  buPROPion (WELLBUTRIN XL) 150 MG 24 hr tablet Take 150 mg by mouth every morning.    Yes Historical Provider, MD  busPIRone (BUSPAR) 10 MG tablet Take 10 mg by mouth 2 (two) times daily.   Yes Historical Provider, MD  Carboxymeth-Glycerin-Polysorb (REFRESH OPTIVE ADVANCED) 0.5-1-0.5 % SOLN Apply 1 drop to eye 4 (four) times daily.   Yes Historical Provider, MD  divalproex (DEPAKOTE ER) 250 MG 24 hr tablet Take 250-1,000 mg by mouth 2 (two) times daily. 1 tablet in the morning  and 4 tablets at bedtime   Yes Historical Provider, MD  fenofibrate (TRICOR) 145 MG tablet Take 145 mg by mouth every morning.   Yes Historical Provider, MD  glipiZIDE (GLUCOTROL XL) 10 MG 24 hr tablet Take 10 mg by mouth daily with breakfast.   Yes Historical Provider, MD  metFORMIN (GLUCOPHAGE) 500 MG tablet Take 500-1,000 mg by mouth 2 (two) times daily. 2 tablets in the morning and 1 tablet at 1300   Yes Historical Provider, MD  olmesartan (BENICAR) 20 MG tablet Take 20 mg by mouth every morning.   Yes Historical Provider, MD  omeprazole (PRILOSEC) 20 MG capsule Take 20 mg by mouth every morning.    Yes Historical Provider, MD  PARoxetine (PAXIL) 20 MG tablet Take 20 mg by mouth every morning.   Yes Historical Provider, MD  QUEtiapine (SEROQUEL XR) 300 MG 24 hr tablet Take 300 mg by mouth daily. Taken daily at 4pm   Yes Historical Provider, MD  simvastatin (ZOCOR) 40 MG tablet Take 40 mg by mouth at bedtime.     Yes Historical Provider, MD  sitaGLIPtin (JANUVIA) 100 MG tablet Take 100 mg by mouth daily.   Yes Historical Provider, MD  tamsulosin (FLOMAX) 0.4 MG CAPS capsule Take 0.4 mg by mouth daily with breakfast.   Yes Historical Provider, MD  triamcinolone ointment (KENALOG) 0.5 % Apply 1 application topically 2 (two) times daily.   Yes Historical Provider, MD  triamterene-hydrochlorothiazide (  MAXZIDE-25) 37.5-25 MG per tablet Take 0.5 tablets by mouth every morning.    Yes Historical Provider, MD    Family History History reviewed. No pertinent family history.  Social History Social History  Substance Use Topics  . Smoking status: Former Smoker    Packs/day: 0.25    Types: Cigarettes  . Smokeless tobacco: Not on file  . Alcohol use No     Comment: Denies     Allergies   Review of patient's allergies indicates no known allergies.   Review of Systems Review of Systems  Constitutional: Negative for appetite change, fatigue and fever.  HENT: Negative for congestion, ear  discharge and sinus pressure.   Eyes: Negative for discharge.  Respiratory: Negative for cough.   Cardiovascular: Negative for chest pain.  Gastrointestinal: Positive for abdominal pain and anorexia. Negative for diarrhea.  Genitourinary: Negative for frequency and hematuria.  Musculoskeletal: Negative for back pain.  Skin: Negative for rash.  Neurological: Negative for seizures and headaches.  Psychiatric/Behavioral: Negative for hallucinations.     Physical Exam Updated Vital Signs BP 108/62   Pulse 76   Temp 97.7 F (36.5 C) (Oral)   Resp 20   Ht 5\' 8"  (1.727 m)   Wt 205 lb (93 kg)   SpO2 98%   BMI 31.17 kg/m   Physical Exam  Constitutional: He is oriented to person, place, and time. He appears well-developed.  HENT:  Head: Normocephalic.  Eyes: Conjunctivae and EOM are normal. No scleral icterus.  Neck: Neck supple. No thyromegaly present.  Cardiovascular: Normal rate and regular rhythm.  Exam reveals no gallop and no friction rub.   No murmur heard. Pulmonary/Chest: No stridor. He has no wheezes. He has no rales. He exhibits no tenderness.  Abdominal: He exhibits no distension. There is no tenderness. There is no rebound.  Musculoskeletal: Normal range of motion. He exhibits no edema.  Lymphadenopathy:    He has no cervical adenopathy.  Neurological: He is oriented to person, place, and time. He exhibits normal muscle tone. Coordination normal.  Skin: No rash noted. No erythema.  Psychiatric: He has a normal mood and affect. His behavior is normal.     ED Treatments / Results  Labs (all labs ordered are listed, but only abnormal results are displayed) Labs Reviewed  CBC WITH DIFFERENTIAL/PLATELET - Abnormal; Notable for the following:       Result Value   RBC 4.06 (*)    Hemoglobin 12.9 (*)    All other components within normal limits  BASIC METABOLIC PANEL - Abnormal; Notable for the following:    Sodium 134 (*)    Potassium 7.7 (*)    CO2 19 (*)     Glucose, Bld 395 (*)    BUN 54 (*)    Creatinine, Ser 2.70 (*)    GFR calc non Af Amer 25 (*)    GFR calc Af Amer 28 (*)    All other components within normal limits  HEPATIC FUNCTION PANEL - Abnormal; Notable for the following:    Total Protein 8.5 (*)    All other components within normal limits  LIPASE, BLOOD - Abnormal; Notable for the following:    Lipase 63 (*)    All other components within normal limits  POTASSIUM - Abnormal; Notable for the following:    Potassium 8.2 (*)    All other components within normal limits  URINALYSIS, ROUTINE W REFLEX MICROSCOPIC (NOT AT [facility])  CORD BLOOD GAS (ARTERIAL)    EKG  EKG Interpretation None       Radiology No results found.  Procedures Procedures (including critical care time)  Medications Ordered in ED Medications  calcium gluconate 1 g in sodium chloride 0.9 % 100 mL IVPB (not administered)  insulin aspart (novoLOG) injection 10 Units (not administered)  dextrose 50 % solution 50 mL (not administered)  sodium bicarbonate injection 50 mEq (not administered)  sodium chloride 0.9 % bolus 1,000 mL (0 mLs Intravenous Stopped 12/24/15 1746)  ondansetron (ZOFRAN) injection 4 mg (4 mg Intravenous Given 12/24/15 1602)     Initial Impression / Assessment and Plan / ED Course  I have reviewed the triage vital signs and the nursing notes.  Pertinent labs & imaging results that were available during my care of the patient were reviewed by me and considered in my medical decision making (see chart for details).  Clinical Course   CRITICAL CARE Performed by: Asjia Berrios L Total critical care time: 40 minutes Critical care time was exclusive of separately billable procedures and treating other patients. Critical care was necessary to treat or prevent imminent or life-threatening deterioration. Critical care was time spent personally by me on the following activities: development of treatment plan with patient and/or surrogate as  well as nursing, discussions with consultants, evaluation of patient's response to treatment, examination of patient, obtaining history from patient or surrogate, ordering and performing treatments and interventions, ordering and review of laboratory studies, ordering and review of radiographic studies, pulse oximetry and re-evaluation of patient's condition.   Patient with hyperkalemia. His potassium initially was 7.7 it was repeated and it was 8.2. He was started on calcium insulin glucose and bicarbonate and was admitted to ICU Final Clinical Impressions(s) / ED Diagnoses   Final diagnoses:  Hyperkalemia    New Prescriptions New Prescriptions   No medications on file     Milton Ferguson, MD 12/24/15 1829

## 2015-12-24 NOTE — H&P (Signed)
History and Physical    David Zamora David Zamora:[id number] DOB: Apr 19, 1960 DOA: 12/24/2015  PCP: [place]) Consultants:  [place], [place], [place] Patient coming from: lives in a group home in [place], attends day program in [place]  Chief Complaint: vomiting, sent by Urgent Care  HPI: David Zamora is a 55 y.o. male with medical history significant of intellectual disability, DM, HTN presenting with severe hyperkalemia.  He attends a day program in [place] and his day program teacher reported that he vomited x 3.  They sent him to Urgent Care and labs were abnormal so they sent him to the ER - abnormal labs were K+ 9.0 (!), CO2 21, BUN 68, Creatinine 2.7, Glu 386.  He was given a shot of Phenergan with improvement in the n/v.  Diffuse abdominal pain during episodes of vomiting.  Currently he states that he feels fine and has no pain.  Last peed before coming to the ER.  Struggled to make urine here.  Voided about 100 cc after effort.  It is tea-colored.  ED Course:  Per Dr. Roderic Zamora: Patient with hyperkalemia. His potassium initially was 7.7 it was repeated and it was 8.2. He was started on calcium insulin glucose and bicarbonate and was admitted to ICU  Review of Systems: As per HPI; otherwise 10 point review of systems reviewed and negative.   Ambulatory Status:  Walks without assistance  Past Medical History:  Diagnosis Date  . Depression   . Diabetes mellitus   . GERD (gastroesophageal reflux disease)   . Hypertension   . Mental retardation     Past Surgical History:  Procedure Laterality Date  . FINGER FRACTURE SURGERY  02/22/2013    Social History   Social History  . Marital status: Single    Spouse name: N/A  . Number of children: N/A  . Years of education: N/A   Occupational History  . Not on file.   Social History Main Topics  . Smoking status: Former Smoker    Packs/day: 0.25    Types: Cigarettes    Quit date: 2012  . Smokeless tobacco:  Never Used  . Alcohol use No     Comment: Denies  . Drug use: No     Comment: Denies   . Sexual activity: Not on file   Other Topics Concern  . Not on file   Social History Narrative   Patient is mentally retarded.  He is a ward of the state.  Has a guardian:  [place],     No Known Allergies  History reviewed. No pertinent family history.  Prior to Admission medications   Medication Sig Start Date End Date Taking? Authorizing Provider  aspirin EC 81 MG tablet Take 81 mg by mouth daily.   Yes Historical Provider, MD  buPROPion (WELLBUTRIN XL) 150 MG 24 hr tablet Take 150 mg by mouth every morning.    Yes Historical Provider, MD  busPIRone (BUSPAR) 10 MG tablet Take 10 mg by mouth 2 (two) times daily.   Yes Historical Provider, MD  Carboxymeth-Glycerin-Polysorb (REFRESH OPTIVE ADVANCED) 0.5-1-0.5 % SOLN Apply 1 drop to eye 4 (four) times daily.   Yes Historical Provider, MD  divalproex (DEPAKOTE ER) 250 MG 24 hr tablet Take 250-1,000 mg by mouth 2 (two) times daily. 1 tablet in the morning and 4 tablets at bedtime   Yes Historical Provider, MD  fenofibrate (TRICOR) 145 MG tablet Take 145 mg by mouth every morning.   Yes Historical Provider,  MD  glipiZIDE (GLUCOTROL XL) 10 MG 24 hr tablet Take 10 mg by mouth daily with breakfast.   Yes Historical Provider, MD  metFORMIN (GLUCOPHAGE) 500 MG tablet Take 500-1,000 mg by mouth 2 (two) times daily. 2 tablets in the morning and 1 tablet at 1300   Yes Historical Provider, MD  olmesartan (BENICAR) 20 MG tablet Take 20 mg by mouth every morning.   Yes Historical Provider, MD  omeprazole (PRILOSEC) 20 MG capsule Take 20 mg by mouth every morning.    Yes Historical Provider, MD  PARoxetine (PAXIL) 20 MG tablet Take 20 mg by mouth every morning.   Yes Historical Provider, MD  QUEtiapine (SEROQUEL XR) 300 MG 24 hr tablet Take 300 mg by mouth daily. Taken daily at 4pm   Yes Historical Provider, MD  simvastatin (ZOCOR) 40 MG tablet Take 40 mg by  mouth at bedtime.     Yes Historical Provider, MD  sitaGLIPtin (JANUVIA) 100 MG tablet Take 100 mg by mouth daily.   Yes Historical Provider, MD  tamsulosin (FLOMAX) 0.4 MG CAPS capsule Take 0.4 mg by mouth daily with breakfast.   Yes Historical Provider, MD  triamcinolone ointment (KENALOG) 0.5 % Apply 1 application topically 2 (two) times daily.   Yes Historical Provider, MD  triamterene-hydrochlorothiazide (MAXZIDE-25) 37.5-25 MG per tablet Take 0.5 tablets by mouth every morning.    Yes Historical Provider, MD    Physical Exam: Vitals:   12/24/15 1514 12/24/15 1516 12/24/15 1716 12/24/15 1920  BP:  138/76 108/62 119/65  Pulse:  91 76 73  Resp:  16 20 20   Temp:  97.7 F (36.5 C)    TempSrc:  Oral    SpO2:  100% 98% 100%  Weight: 93 kg (205 lb)     Height: 5\' 8"  (1.727 m)        General: Appears calm and comfortable and is NAD Eyes:  PERRL, EOMI, normal lids, iris ENT:  grossly normal hearing, lips & tongue, mmm Neck:  no LAD, masses or thyromegaly Cardiovascular:  RRR, no m/r/g. No LE edema.  Respiratory:  CTA bilaterally, no w/r/r. Normal respiratory effort. Abdomen:  soft, ntnd, NABS Skin:  no rash or induration seen on limited exam Musculoskeletal:  grossly normal tone BUE/BLE, good ROM, no bony abnormality Psychiatric:  grossly normal mood and affect, speech fluent and appropriate, AOx3, exhibits mild intellectual disability but able to converse appropriately and  Answer simple questions Neurologic:  CN 2-12 grossly intact, moves all extremities in coordinated fashion, sensation intact  Labs on Admission: I have personally reviewed following labs and imaging studies  CBC:  Recent Labs Lab 12/24/15 1530  WBC 8.3  NEUTROABS 7.1  HGB 12.9*  HCT 40.6  MCV 100.0  PLT 811   Basic Metabolic Panel:  Recent Labs Lab 12/24/15 1530 12/24/15 1658  NA 134* 132*  K 7.7* 7.9*  8.2*  CL 106 106  CO2 19* 20*  GLUCOSE 395* 400*  BUN 54* 54*  CREATININE 2.70* 2.81*    CALCIUM 10.2 10.1  PHOS  --  1.7*   GFR: Estimated Creatinine Clearance: 32.1 mL/min (by C-G formula based on SCr of 2.81 mg/dL). Liver Function Tests:  Recent Labs Lab 12/24/15 1530 12/24/15 1658  AST 29  --   ALT 33  --   ALKPHOS 60  --   BILITOT 0.9  --   PROT 8.5*  --   ALBUMIN 5.0 5.0    Recent Labs Lab 12/24/15 1530  LIPASE 63*  No results for input(s): AMMONIA in the last 168 hours. Coagulation Profile: No results for input(s): INR, PROTIME in the last 168 hours. Cardiac Enzymes: No results for input(s): CKTOTAL, CKMB, CKMBINDEX, TROPONINI in the last 168 hours. BNP (last 3 results) No results for input(s): PROBNP in the last 8760 hours. HbA1C: No results for input(s): HGBA1C in the last 72 hours. CBG: No results for input(s): GLUCAP in the last 168 hours. Lipid Profile: No results for input(s): CHOL, HDL, LDLCALC, TRIG, CHOLHDL, LDLDIRECT in the last 72 hours. Thyroid Function Tests: No results for input(s): TSH, T4TOTAL, FREET4, T3FREE, THYROIDAB in the last 72 hours. Anemia Panel: No results for input(s): VITAMINB12, FOLATE, FERRITIN, TIBC, IRON, RETICCTPCT in the last 72 hours. Urine analysis:    Component Value Date/Time   COLORURINE YELLOW 12/24/2015 1520   APPEARANCEUR HAZY (A) 12/24/2015 1520   APPEARANCEUR Clear 02/20/2013 1229   LABSPEC >1.030 (H) 12/24/2015 1520   LABSPEC 1.011 02/20/2013 1229   PHURINE 5.5 12/24/2015 1520   GLUCOSEU 100 (A) 12/24/2015 1520   GLUCOSEU >=500 02/20/2013 1229   HGBUR NEGATIVE 12/24/2015 1520   BILIRUBINUR MODERATE (A) 12/24/2015 1520   BILIRUBINUR Negative 02/20/2013 1229   KETONESUR TRACE (A) 12/24/2015 1520   PROTEINUR 100 (A) 12/24/2015 1520   NITRITE NEGATIVE 12/24/2015 1520   LEUKOCYTESUR NEGATIVE 12/24/2015 1520   LEUKOCYTESUR Negative 02/20/2013 1229    Creatinine Clearance: Estimated Creatinine Clearance: 32.1 mL/min (by C-G formula based on SCr of 2.81 mg/dL).  Sepsis  Labs: @LABRCNTIP (procalcitonin:4,lacticidven:4) )No results found for this or any previous visit (from the past 240 hour(s)).   Radiological Exams on Admission: No results found.  EKG: Independently reviewed.  NSR without arrhythmia or obviously peaked t waves Assessment/Plan Principal Problem:   Hyperkalemia Active Problems:   Uncontrolled diabetes mellitus (HCC)   Acute kidney injury superimposed on chronic kidney disease (HCC)   Intellectual disability   Essential hypertension   Depression   GERD (gastroesophageal reflux disease)   Hyperkalemia -Patient with acute/subacute hyperkalemia which appears to be resulting from AKI on CKD in conjunction with poorly controlled diabetes with mild acidosis and medication use including metformin, ARB, and Maxzide use -Was given 1L NS bolus, insulin/glucose, calcium gluconate, and HCO3 in ER -Was seen by nephrology in ER and he recommends: NS at 145 cc/hr; Lasix 60 mg IV BID; Kayexalate 30 g q4h x 3 doses; renal panel q2h x 3. -Will give an additional 2L LR bolus now and then continue to replete at 150 cc/hour  -No ectopy or peaked t waves appreciated on EKG -If his potassium is not responding to prescribed treatments, he may require dialysis -Appreciate nephrology input  Uncontrolled DM -Patient's caregiver reports prior h/o insulin injections but changed to orals and closely followed -She reports AM accuchecks in 140-160 range with PM glucose up to 200s -With mild DKA, insulin drip was considered; however, patient has normal anion gap and minimal acidosis -Will give an additional 10 units SQ insulin now and then start q4h accuchecks with SSI -Patient is likely to need insulin at the time of hospital discharge -A1c pending  AKI on CKD -Baseline creatinine appears to be about 1.5 from 8270 -Uncertain current baseline creatinine -Current creatinine is 2.70 -Will aggressively rehydrate based on hyperkalemia, uncontrolled DM, and apparent  AKI -Trending q2h for now as above -Holding Metformin, ARB, Maxzide  HTN -Holding current meds -Will cover with prn IV hydralazine  Depression -Continue Wellbutrin, Buspar, Paxil, Seroquel, Depakote -Suspect that this is more significant mental  illness than simply depression  GERD -Continue Prilosec  ID -Able to communicate and answer simple questions  DVT prophylaxis:  Lovenox Code Status:  Full - patient unable to answer, as is caregiver Family Communication: Caregiver at bedside; patient is ward of the state  Disposition Plan:  Back to group home once clinically improved Consults called: Nephrology  Admission status: Admit to SDU    Karmen Bongo MD [facility]  If 7PM-7AM, please contact night-coverage [email or web address] Password [email or web address]  12/24/2015, 8:22 PM

## 2015-12-24 NOTE — ED Notes (Signed)
CRITICAL VALUE ALERT  Critical value received:  Potassium 7.9  Date of notification:  12/24/15  Time of notification:  2000  Critical value read back:Yes.    Nurse who received alert:  Toma Deiters  MD notified (1st page):  Dr Lorin Mercy  Time of first page:  2002  MD notified (2nd page):  Time of second page:  Responding MD:  Dr Lorin Mercy  Time MD responded:  2005

## 2015-12-24 NOTE — ED Notes (Signed)
Pt is the ward of the state. [place], [phone] After hours number [phone] Gave verbal consent to treat  By Benay Pillow and Boneta Lucks

## 2015-12-24 NOTE — ED Triage Notes (Addendum)
[facility] from [facility] [place], bib staff member [place]. Reports generalized abd pain with n/v/d x3 weeks.  Pt reports he is having difficulty urinating.

## 2015-12-25 ENCOUNTER — Inpatient Hospital Stay (HOSPITAL_COMMUNITY): Payer: Medicare Other

## 2015-12-25 DIAGNOSIS — N189 Chronic kidney disease, unspecified: Secondary | ICD-10-CM

## 2015-12-25 DIAGNOSIS — K219 Gastro-esophageal reflux disease without esophagitis: Secondary | ICD-10-CM

## 2015-12-25 DIAGNOSIS — I10 Essential (primary) hypertension: Secondary | ICD-10-CM

## 2015-12-25 DIAGNOSIS — N179 Acute kidney failure, unspecified: Secondary | ICD-10-CM

## 2015-12-25 DIAGNOSIS — F329 Major depressive disorder, single episode, unspecified: Secondary | ICD-10-CM

## 2015-12-25 LAB — BASIC METABOLIC PANEL
Anion gap: 11 (ref 5–15)
BUN: 41 mg/dL — AB (ref 6–20)
CHLORIDE: 100 mmol/L — AB (ref 101–111)
CO2: 22 mmol/L (ref 22–32)
CREATININE: 2.02 mg/dL — AB (ref 0.61–1.24)
Calcium: 8.9 mg/dL (ref 8.9–10.3)
GFR calc Af Amer: 40 mL/min — ABNORMAL LOW (ref >60)
GFR calc non Af Amer: 35 mL/min — ABNORMAL LOW (ref >60)
Glucose, Bld: 287 mg/dL — ABNORMAL HIGH (ref 65–99)
POTASSIUM: 4 mmol/L (ref 3.5–5.1)
SODIUM: 133 mmol/L — AB (ref 135–145)

## 2015-12-25 LAB — RENAL FUNCTION PANEL
ALBUMIN: 4.1 g/dL (ref 3.5–5.0)
ANION GAP: 8 (ref 5–15)
Albumin: 3.4 g/dL — ABNORMAL LOW (ref 3.5–5.0)
Anion gap: 4 — ABNORMAL LOW (ref 5–15)
BUN: 48 mg/dL — AB (ref 6–20)
BUN: 52 mg/dL — AB (ref 6–20)
CALCIUM: 9.5 mg/dL (ref 8.9–10.3)
CHLORIDE: 110 mmol/L (ref 101–111)
CO2: 23 mmol/L (ref 22–32)
CO2: 21 mmol/L — AB (ref 22–32)
Calcium: 8.6 mg/dL — ABNORMAL LOW (ref 8.9–10.3)
Chloride: 107 mmol/L (ref 101–111)
Creatinine, Ser: 2.02 mg/dL — ABNORMAL HIGH (ref 0.61–1.24)
Creatinine, Ser: 2.39 mg/dL — ABNORMAL HIGH (ref 0.61–1.24)
GFR calc Af Amer: 33 mL/min — ABNORMAL LOW (ref >60)
GFR calc Af Amer: 40 mL/min — ABNORMAL LOW (ref >60)
GFR calc non Af Amer: 28 mL/min — ABNORMAL LOW (ref >60)
GFR calc non Af Amer: 35 mL/min — ABNORMAL LOW (ref >60)
GLUCOSE: 143 mg/dL — AB (ref 65–99)
GLUCOSE: 159 mg/dL — AB (ref 65–99)
PHOSPHORUS: 2.2 mg/dL — AB (ref 2.5–4.6)
POTASSIUM: 4.6 mmol/L (ref 3.5–5.1)
Phosphorus: 2.7 mg/dL (ref 2.5–4.6)
Potassium: 5 mmol/L (ref 3.5–5.1)
SODIUM: 136 mmol/L (ref 135–145)
Sodium: 137 mmol/L (ref 135–145)

## 2015-12-25 LAB — GLUCOSE, CAPILLARY
GLUCOSE-CAPILLARY: 150 mg/dL — AB (ref 65–99)
GLUCOSE-CAPILLARY: 128 mg/dL — AB (ref 65–99)
GLUCOSE-CAPILLARY: 129 mg/dL — AB (ref 65–99)
GLUCOSE-CAPILLARY: 244 mg/dL — AB (ref 65–99)
GLUCOSE-CAPILLARY: 242 mg/dL — AB (ref 65–99)
GLUCOSE-CAPILLARY: 306 mg/dL — AB (ref 65–99)
Glucose-Capillary: 256 mg/dL — ABNORMAL HIGH (ref 65–99)

## 2015-12-25 LAB — CBC
HEMATOCRIT: 30.6 % — AB (ref 39.0–52.0)
HEMOGLOBIN: 10.1 g/dL — AB (ref 13.0–17.0)
MCH: 32.5 pg (ref 26.0–34.0)
MCHC: 33 g/dL (ref 30.0–36.0)
MCV: 98.4 fL (ref 78.0–100.0)
Platelets: 170 10*3/uL (ref 150–400)
RBC: 3.11 MIL/uL — ABNORMAL LOW (ref 4.22–5.81)
RDW: 13.9 % (ref 11.5–15.5)
WBC: 7 10*3/uL (ref 4.0–10.5)

## 2015-12-25 LAB — MRSA PCR SCREENING: MRSA BY PCR: NEGATIVE

## 2015-12-25 MED ORDER — INSULIN ASPART 100 UNIT/ML ~~LOC~~ SOLN
0.0000 [IU] | Freq: Three times a day (TID) | SUBCUTANEOUS | Status: DC
  Administered 2015-12-25 – 2015-12-26 (×4): 5 [IU] via SUBCUTANEOUS
  Administered 2015-12-26 – 2015-12-27 (×2): 11 [IU] via SUBCUTANEOUS
  Administered 2015-12-27: 5 [IU] via SUBCUTANEOUS

## 2015-12-25 MED ORDER — FUROSEMIDE 10 MG/ML IJ SOLN
40.0000 mg | Freq: Two times a day (BID) | INTRAMUSCULAR | Status: DC
  Administered 2015-12-25 – 2015-12-26 (×3): 40 mg via INTRAVENOUS
  Filled 2015-12-25 (×3): qty 4

## 2015-12-25 NOTE — Progress Notes (Signed)
Subjective: Interval History: Patient denies any difficulty in breathing. His nausea and vomiting has improved and no abdominal pain. Presently he offers no complaints.  Objective: Vital signs in last 24 hours: Temp:  [97.7 F (36.5 C)-99 F (37.2 C)] 98 F (36.7 C) (09/12 0400) Pulse Rate:  [73-91] 73 (09/11 2000) Resp:  [16-20] 18 (09/11 2000) BP: (108-138)/(62-80) 116/80 (09/11 2000) SpO2:  [98 %-100 %] 100 % (09/11 2000) Weight:  [90.9 kg (200 lb 6.4 oz)-93 kg (205 lb)] 92.1 kg (203 lb 0.7 oz) (09/12 0500) Weight change:   Intake/Output from previous day: 09/11 0701 - 09/12 0700 In: -  Out: 500 [Urine:500] Intake/Output this shift: No intake/output data recorded.  General appearance: alert, cooperative and no distress Resp: clear to auscultation bilaterally Cardio: regular rate and rhythm, S1, S2 normal, no murmur, click, rub or gallop GI: soft, non-tender; bowel sounds normal; no masses,  no organomegaly Extremities: extremities normal, atraumatic, no cyanosis or edema  Lab Results:  Recent Labs  12/24/15 1530 12/25/15 0445  WBC 8.3 7.0  HGB 12.9* 10.1*  HCT 40.6 30.6*  PLT 228 170   BMET:  Recent Labs  12/24/15 2354 12/25/15 0445  NA 136 137  K 5.0 4.6  CL 107 110  CO2 21* 23  GLUCOSE 159* 143*  BUN 52* 48*  CREATININE 2.39* 2.02*  CALCIUM 9.5 8.6*   No results for input(s): PTH in the last 72 hours. Iron Studies: No results for input(s): IRON, TIBC, TRANSFERRIN, FERRITIN in the last 72 hours.  Studies/Results: No results found.  I have reviewed the patient's current medications.  Assessment/Plan: Problem #1 hyperkalemia: His potassium is normal today. Problem #2 renal failure: Possibly acute on chronic. His BUN and creatinine seems to be improving. Patient presently nonoliguric. Problem #3 metabolic acidosis: His CO2 is 23 which is much better. Problem #4 hypertension: His blood pressure is reasonably controlled Problem #5 diabetes Problem #6  anemia: His hemoglobin slightly low.  Problem #7 history of mental Retardation Problem #8 proteinuria: Nephrotic range. Possibly from diabetes. Plan: We'll do ultrasound of the kidneys 2] we'll check ANA, complement, hepatitis C antibody, hepatitis B surface antigen 3] we'll check urine microalbumin to creatinine ratio 4] we'll decrease IV fluids to 100 mL per hour 5] we'll decrease Lasix to 40 mg IV twice a day 6] we'll check his renal panel in the morning   LOS: 1 day   Brittan Butterbaugh S 12/25/2015,7:31 AM

## 2015-12-25 NOTE — Clinical Social Work Note (Signed)
Clinical Social Work Assessment  Patient Details  Name: David Zamora MRN: [id number] Date of Birth: 04-Oct-1960  Date of referral:  12/25/15               Reason for consult:  Other (Comment Required) (From [name] )                Permission sought to share information with:    Permission granted to share information::     Name::        Agency::     Relationship::     Contact Information:  [place],  Teaching laboratory technician at [employer] Living arrangements for the past 2 months:  [place] of Information:  Patient, Other (Comment Required) Games developer) Patient Interpreter Needed:  None Criminal Activity/Legal Involvement Pertinent to Current Situation/Hospitalization:  No - Comment as needed Significant Relationships:  Warehouse manager Lives with:  Facility Resident Do you feel safe going back to the place where you live?  Yes Need for family participation in patient care:  Yes (Comment)  Care giving concerns:  Facility resident.    Social Worker assessment / plan:  Patient stated that she has been at the facility for "a long time." Patient stated that he completes ADLs independently and ambulates independently.  He stated that he wants to go back to the facility at discharge. [name] left a voicemail message for patient's guardian, [place]. [name] spoke with [place], facility house manager, she confirmed patient's statements. She indicated that patient can return to the facility at discharge.   Employment status:    Forensic scientist:  Medicare, Medicaid In [place] PT Recommendations:  Not assessed at this time Information / Referral to community resources:     Patient/Family's Response to care:  Patient is agreeable to return to the facility.  Patient/Family's Understanding of and Emotional Response to Diagnosis, Current Treatment, and Prognosis: Patient understands that he is sick, but did not go in to detail about the exact  nature of his current illness.   Emotional Assessment Appearance:  Appears stated age Attitude/Demeanor/Rapport:   (Cooperative) Affect (typically observed):  Accepting, Calm Orientation:  Oriented to Self, Oriented to Place, Oriented to Situation Alcohol / Substance use:  Not Applicable Psych involvement (Current and /or in the community):  No (Comment)  Discharge Needs  Concerns to be addressed:  Other (Comment Required (Return to Facility) Readmission within the last 30 days:  No Current discharge risk:  None Barriers to Discharge:  No Barriers Identified   David Gully, LCSW 12/25/2015, 12:18 PM

## 2015-12-25 NOTE — Progress Notes (Signed)
Inpatient Diabetes Program Recommendations  AACE/ADA: New Consensus Statement on Inpatient Glycemic Control (2015)  Target Ranges:  Prepandial:   less than 140 mg/dL      Peak postprandial:   less than 180 mg/dL (1-2 hours)      Critically ill patients:  140 - 180 mg/dL   Lab Results  Component Value Date   GLUCAP 129 (H) 12/25/2015    Review of Glycemic Control  Results for [place], EASTRIDGE (MRN [id number]) as of 12/25/2015 10:34  Ref. Range 12/24/2015 21:28 12/25/2015 04:52 12/25/2015 08:02 12/25/2015 10:16  Glucose-Capillary Latest Ref Range: 65 - 99 mg/dL 256 (H) 150 (H) 128 (H) 129 (H)    Diabetes history: Type 2, A1C in progress Outpatient Diabetes medications: Glucotrol 10mg /day, Glucophage 500mg  qam, 1000mg  qpm, Januvia 100mg /day  Current orders for Inpatient glycemic control: Novolog 10 units q4h  Inpatient Diabetes Program Recommendations:  Since patient is now eating , consider d/c Novolog as ordered and consider ordering Novolog moderate correction 0-15 units tid and Novolog 0-5 units qhs.    Discussed with RN who will address with MD who is currently in the unit.  Gentry Fitz, RN, BA, MHA, CDE Diabetes Coordinator Inpatient Diabetes Program  [phone] (Team Pager) [phone] ([place]) 12/25/2015 10:49 AM

## 2015-12-25 NOTE — Care Management Note (Signed)
Case Management Note  Patient Details  Name: David Zamora MRN: [id number] Date of Birth: 02-Aug-1960  Subjective/Objective:                  Pt admitted with hyperkalemia. He is from [place]. He is ind with ADL's. He ambulates independently. He has no HH services or DME PTA.  He plans to return to group home at [place] is aware of DC plan and will make arrangements for return to facility when appropriate.   Action/Plan: No CM needs anticipated.   Expected Discharge Date:      12/28/2015            Expected Discharge Plan:  Group Home  In-House Referral:  Clinical Social Work  Discharge planning Services  CM Consult  Post Acute Care Choice:  NA Choice offered to:  NA  DME Arranged:    DME Agency:     HH Arranged:    HH Agency:     Status of Service:  In process, will continue to follow  If discussed at Long Length of Stay Meetings, dates discussed:    Additional Comments:  Sherald Barge, RN 12/25/2015, 2:11 PM

## 2015-12-25 NOTE — Progress Notes (Signed)
PROGRESS NOTE    David Zamora  SNK:[id number] DOB: [birth year] DOA: 12/24/2015 PCP: No PCP Per Patient   Brief Narrative:  55 y.o. male with medical history significant of intellectual disability, DM, HTN presenting with severe hyperkalemia.  He attends a day program in [place] and his day program teacher reported that he vomited x 3.  They sent him to Urgent Care and labs were abnormal so they sent him to the ER - abnormal labs were K+ 9.0 (!), CO2 21, BUN 68, Creatinine 2.7, Glu 386.  He was given a shot of Phenergan with improvement in the n/v.  Diffuse abdominal pain during episodes of vomiting.  Currently he states that he feels fine and has no pain.  Last urinated before coming to the ER.  Struggled to make urine here.  Voided about 100 cc after effort.  It is tea-colored.   Assessment & Plan:   Principal Problem:   Hyperkalemia Active Problems:   Uncontrolled diabetes mellitus ([place])   Acute kidney injury superimposed on chronic kidney disease (HCC)   Intellectual disability   Essential hypertension   Depression   GERD (gastroesophageal reflux disease)   Hyperkalemia -Patient with acute/subacute hyperkalemia which appears to be resulting from AKI on CKD in conjunction with poorly controlled diabetes with mild acidosis and medication use including metformin, ARB, and Maxzide use -s/p 1L NS bolus, insulin/glucose, calcium gluconate, and HCO3 in ER -seen by nephrology in ER and he recommends: NS at 145 cc/hr; Lasix 60 mg IV BID; Kayexalate 30 g q4h x 3 doses; renal panel q2h x 3. -Will give an additional 2L LR bolus now and then continue to replete at 150 cc/hour  -No ectopy or peaked t waves appreciated on EKG -potassium improved  Uncontrolled DM -Patient's caregiver reports prior h/o insulin injections but changed to orals and closely followed -She reports AM accuchecks in 140-160 range with PM glucose up to 200s -With mild DKA, insulin drip was considered; however,  patient has normal anion gap and minimal acidosis -Will give an additional 10 units SQ insulin now and then start q4h accuchecks with SSI -A1c pending  AKI on CKD -Baseline creatinine appears to be about 1.5 from 9379 -Uncertain current baseline creatinine -Current creatinine is 2.02 -Will aggressively rehydrate based on hyperkalemia, uncontrolled DM, and apparent AKI -Will redraw BMP at 1700 -Holding Metformin, ARB, Maxzide  HTN -Holding current meds -Will cover with prn IV hydralazine  Depression -Continue Wellbutrin, Buspar, Paxil, Seroquel, Depakote -Suspect that this is more significant mental illness than simply depression  GERD -Continue Prilosec  ID -Able to communicate and answer simple questions   DVT prophylaxis: Lovenox Code Status: Full code Family Communication: No one bedside Disposition Plan: Will discharge back to group home when medically improved   Consultants:   Nephrology  Procedures:   none  Antimicrobials:   none    Subjective: Patient awake when evaluated.  He is asking whether or not he can eat breakfast.  He wants his coffee.  Denies any chest pain, shortness of breath, abdominal pain.  Seems to be at baseline orientation.  Scheduled to get kidney ultrasound today.  Objective: Vitals:   12/24/15 2100 12/25/15 0400 12/25/15 0500 12/25/15 0858  BP:      Pulse:      Resp:      Temp: 99 F (37.2 C) 98 F (36.7 C)    TempSrc: Oral Oral    SpO2:    99%  Weight: 90.9 kg (200 lb 6.4  oz)  92.1 kg (203 lb 0.7 oz)   Height: 5\' 5"  (1.651 m)       Intake/Output Summary (Last 24 hours) at 12/25/15 1001 Last data filed at 12/25/15 0500  Gross per 24 hour  Intake                0 ml  Output              500 ml  Net             -500 ml   Filed Weights   12/24/15 1514 12/24/15 2100 12/25/15 0500  Weight: 93 kg (205 lb) 90.9 kg (200 lb 6.4 oz) 92.1 kg (203 lb 0.7 oz)    Examination:  General exam: Appears calm and comfortable    Respiratory system: Clear to auscultation. Respiratory effort normal. Cardiovascular system: S1 & S2 heard, RRR. No JVD, murmurs, rubs, gallops or clicks. No pedal edema. Gastrointestinal system: Abdomen is nondistended, soft and nontender. No organomegaly or masses felt. Normal bowel sounds heard. Central nervous system: Alert and oriented to person and situation. No focal neurological deficits. Extremities: Symmetric 5 x 5 power. Skin: No rashes, lesions or ulcers Psychiatry: Judgement appears normal for him. Mood & affect appropriate.     Data Reviewed: I have personally reviewed following labs and imaging studies  CBC:  Recent Labs Lab 12/24/15 1530 12/25/15 0445  WBC 8.3 7.0  NEUTROABS 7.1  --   HGB 12.9* 10.1*  HCT 40.6 30.6*  MCV 100.0 98.4  PLT 228 161   Basic Metabolic Panel:  Recent Labs Lab 12/24/15 1658 12/24/15 2053 12/24/15 2223 12/24/15 2354 12/25/15 0445  NA 132* 131* 133* 136 137  K 7.9*  8.2* 6.1* 5.7* 5.0 4.6  CL 106 111 107 107 110  CO2 20* 17* 20* 21* 23  GLUCOSE 400* 275* 227* 159* 143*  BUN 54* 54* 53* 52* 48*  CREATININE 2.81* 2.46* 2.36* 2.39* 2.02*  CALCIUM 10.1 8.8* 8.9 9.5 8.6*  PHOS 1.7* 1.3* 1.4* 2.2* 2.7   GFR: Estimated Creatinine Clearance: 42.1 mL/min (by C-G formula based on SCr of 2.02 mg/dL). Liver Function Tests:  Recent Labs Lab 12/24/15 1530 12/24/15 1658 12/24/15 2053 12/24/15 2223 12/24/15 2354 12/25/15 0445  AST 29  --   --   --   --   --   ALT 33  --   --   --   --   --   ALKPHOS 60  --   --   --   --   --   BILITOT 0.9  --   --   --   --   --   PROT 8.5*  --   --   --   --   --   ALBUMIN 5.0 5.0 4.2 3.7 4.1 3.4*    Recent Labs Lab 12/24/15 1530  LIPASE 63*   No results for input(s): AMMONIA in the last 168 hours. Coagulation Profile: No results for input(s): INR, PROTIME in the last 168 hours. Cardiac Enzymes: No results for input(s): CKTOTAL, CKMB, CKMBINDEX, TROPONINI in the last 168 hours. BNP  (last 3 results) No results for input(s): PROBNP in the last 8760 hours. HbA1C: No results for input(s): HGBA1C in the last 72 hours. CBG:  Recent Labs Lab 12/24/15 2128 12/25/15 0452 12/25/15 0802  GLUCAP 256* 150* 128*   Lipid Profile: No results for input(s): CHOL, HDL, LDLCALC, TRIG, CHOLHDL, LDLDIRECT in the last 72 hours. Thyroid Function Tests: No results  for input(s): TSH, T4TOTAL, FREET4, T3FREE, THYROIDAB in the last 72 hours. Anemia Panel: No results for input(s): VITAMINB12, FOLATE, FERRITIN, TIBC, IRON, RETICCTPCT in the last 72 hours. Sepsis Labs: No results for input(s): PROCALCITON, LATICACIDVEN in the last 168 hours.  Recent Results (from the past 240 hour(s))  MRSA PCR Screening     Status: None   Collection Time: 12/24/15  9:44 PM  Result Value Ref Range Status   MRSA by PCR NEGATIVE NEGATIVE Final    Comment:        The GeneXpert MRSA Assay (FDA approved for NASAL specimens only), is one component of a comprehensive MRSA colonization surveillance program. It is not intended to diagnose MRSA infection nor to guide or monitor treatment for MRSA infections.          Radiology Studies: No results found.      Scheduled Meds: . aspirin EC  81 mg Oral Daily  . buPROPion  150 mg Oral q morning - 10a  . busPIRone  10 mg Oral BID  . divalproex  250 mg Oral Daily   And  . divalproex  1,000 mg Oral QHS  . enoxaparin (LOVENOX) injection  40 mg Subcutaneous Q24H  . fenofibrate  160 mg Oral Daily  . furosemide  40 mg Intravenous BID  . insulin aspart  10 Units Subcutaneous Q4H  . pantoprazole  40 mg Oral Daily  . PARoxetine  20 mg Oral q morning - 10a  . polyvinyl alcohol  1 drop Both Eyes QID  . QUEtiapine  300 mg Oral Q1500  . simvastatin  40 mg Oral QHS  . sodium chloride flush  3 mL Intravenous Q12H  . tamsulosin  0.4 mg Oral Q breakfast   Continuous Infusions: . sodium chloride 150 mL/hr at 12/25/15 0639     LOS: 1 day    Time  spent: 35 minutes    Newman Pies, MD [facility] Pager [phone]  If 7PM-7AM, please contact night-coverage [email or web address] Password [email or web address] 12/25/2015, 10:01 AM

## 2015-12-26 DIAGNOSIS — E875 Hyperkalemia: Principal | ICD-10-CM

## 2015-12-26 LAB — RENAL FUNCTION PANEL
Albumin: 3.5 g/dL (ref 3.5–5.0)
Anion gap: 11 (ref 5–15)
BUN: 36 mg/dL — AB (ref 6–20)
CHLORIDE: 98 mmol/L — AB (ref 101–111)
CO2: 25 mmol/L (ref 22–32)
CREATININE: 1.79 mg/dL — AB (ref 0.61–1.24)
Calcium: 8.7 mg/dL — ABNORMAL LOW (ref 8.9–10.3)
GFR calc Af Amer: 47 mL/min — ABNORMAL LOW (ref >60)
GFR, EST NON AFRICAN AMERICAN: 40 mL/min — AB (ref >60)
GLUCOSE: 265 mg/dL — AB (ref 65–99)
POTASSIUM: 3.7 mmol/L (ref 3.5–5.1)
Phosphorus: 3.4 mg/dL (ref 2.5–4.6)
Sodium: 134 mmol/L — ABNORMAL LOW (ref 135–145)

## 2015-12-26 LAB — MICROALBUMIN / CREATININE URINE RATIO: CREATININE, UR: 13.6 mg/dL

## 2015-12-26 LAB — HEPATITIS B SURFACE ANTIGEN: Hepatitis B Surface Ag: NEGATIVE

## 2015-12-26 LAB — HEPATITIS C ANTIBODY: HCV Ab: 0.1 s/co ratio (ref 0.0–0.9)

## 2015-12-26 LAB — C3 COMPLEMENT: C3 Complement: 133 mg/dL (ref 82–167)

## 2015-12-26 LAB — HEMOGLOBIN A1C
HEMOGLOBIN A1C: 9 % — AB (ref 4.8–5.6)
Mean Plasma Glucose: 212 mg/dL

## 2015-12-26 LAB — GLUCOSE, CAPILLARY
GLUCOSE-CAPILLARY: 213 mg/dL — AB (ref 65–99)
GLUCOSE-CAPILLARY: 524 mg/dL — AB (ref 65–99)
Glucose-Capillary: 334 mg/dL — ABNORMAL HIGH (ref 65–99)
Glucose-Capillary: 238 mg/dL — ABNORMAL HIGH (ref 65–99)

## 2015-12-26 LAB — GLUCOSE, RANDOM: GLUCOSE: 480 mg/dL — AB (ref 65–99)

## 2015-12-26 LAB — ANTINUCLEAR ANTIBODIES, IFA: ANTINUCLEAR ANTIBODIES, IFA: NEGATIVE

## 2015-12-26 LAB — C4 COMPLEMENT: Complement C4, Body Fluid: 19 mg/dL (ref 14–44)

## 2015-12-26 LAB — ANTISTREPTOLYSIN O TITER: ASO: 45 [IU]/mL (ref 0.0–200.0)

## 2015-12-26 LAB — COMPLEMENT, TOTAL: Compl, Total (CH50): 60 U/mL (ref 42–60)

## 2015-12-26 MED ORDER — FUROSEMIDE 40 MG PO TABS
40.0000 mg | ORAL_TABLET | Freq: Two times a day (BID) | ORAL | Status: DC
  Administered 2015-12-26 – 2015-12-27 (×2): 40 mg via ORAL
  Filled 2015-12-26 (×2): qty 1

## 2015-12-26 MED ORDER — INSULIN GLARGINE 100 UNIT/ML ~~LOC~~ SOLN
10.0000 [IU] | Freq: Every day | SUBCUTANEOUS | Status: DC
  Administered 2015-12-26 – 2015-12-27 (×2): 10 [IU] via SUBCUTANEOUS
  Filled 2015-12-26 (×3): qty 0.1

## 2015-12-26 MED ORDER — INSULIN ASPART 100 UNIT/ML ~~LOC~~ SOLN
10.0000 [IU] | Freq: Once | SUBCUTANEOUS | Status: AC
  Administered 2015-12-26: 10 [IU] via SUBCUTANEOUS

## 2015-12-26 NOTE — Progress Notes (Signed)
David Zamora  MRN: [id number]  DOB/AGE: 01-09-1961 55 y.o.  Primary Care Physician:No PCP Per Patient  Admit date: 12/24/2015  Chief Complaint:  Chief Complaint  Patient presents with  . Abdominal Pain    S-Pt presented on  12/24/2015 with  Chief Complaint  Patient presents with  . Abdominal Pain  .    Pt today feels better  Meds  . aspirin EC  81 mg Oral Daily  . buPROPion  150 mg Oral q morning - 10a  . busPIRone  10 mg Oral BID  . divalproex  250 mg Oral Daily   And  . divalproex  1,000 mg Oral QHS  . enoxaparin (LOVENOX) injection  40 mg Subcutaneous Q24H  . fenofibrate  160 mg Oral Daily  . furosemide  40 mg Intravenous BID  . insulin aspart  0-15 Units Subcutaneous TID WC  . pantoprazole  40 mg Oral Daily  . PARoxetine  20 mg Oral q morning - 10a  . polyvinyl alcohol  1 drop Both Eyes QID  . QUEtiapine  300 mg Oral Q1500  . simvastatin  40 mg Oral QHS  . sodium chloride flush  3 mL Intravenous Q12H  . tamsulosin  0.4 mg Oral Q breakfast      Physical Exam: Vital signs in last 24 hours: Temp:  [96.6 F (35.9 C)-99.2 F (37.3 C)] 98.2 F (36.8 C) (09/13 0400) Pulse Rate:  [70-98] 74 (09/12 2300) Resp:  [16-20] 16 (09/12 2300) BP: (105-131)/(65-82) 107/72 (09/12 2300) SpO2:  [98 %-100 %] 98 % (09/12 2300) Weight:  [201 lb 1 oz (91.2 kg)] 201 lb 1 oz (91.2 kg) (09/13 0500) Weight change: -3 lb 15.1 oz (-1.787 kg) Last BM Date: 12/25/15  Intake/Output from previous day: 09/12 0701 - 09/13 0700 In: 4318.3 [P.O.:1560; I.V.:2758.3] Out: 3800 [Urine:3800] Total I/O In: -  Out: 700 [Urine:700]   Physical Exam: General- pt is awake,alert, oriented to time place and person Resp- No acute REsp distress, CTA B/L NO Rhonchi CVS- S1S2 regular ij rate and rhythm GIT- BS+, soft, NT, ND EXT- NO LE Edema, Cyanosis   Lab Results: CBC  Recent Labs  12/24/15 1530 12/25/15 0445  WBC 8.3 7.0  HGB 12.9* 10.1*  HCT 40.6 30.6*  PLT 228 170     BMET  Recent Labs  12/25/15 1753 12/26/15 0406  NA 133* 134*  K 4.0 3.7  CL 100* 98*  CO2 22 25  GLUCOSE 287* 265*  BUN 41* 36*  CREATININE 2.02* 1.79*  CALCIUM 8.9 8.7*    Creat trend 2017  2.8=>2.0=>1.79 2016  1.5 2014  1.3--1.5  Potassium trend 2017  8.2=>4.0   MICRO Recent Results (from the past 240 hour(s))  MRSA PCR Screening     Status: None   Collection Time: 12/24/15  9:44 PM  Result Value Ref Range Status   MRSA by PCR NEGATIVE NEGATIVE Final    Comment:        The GeneXpert MRSA Assay (FDA approved for NASAL specimens only), is one component of a comprehensive MRSA colonization surveillance program. It is not intended to diagnose MRSA infection nor to guide or monitor treatment for MRSA infections.       Lab Results  Component Value Date   CALCIUM 8.7 (L) 12/26/2015   PHOS 3.4 12/26/2015  alb  3.5 Corrected calcium  9.2       Impression: 1)Renal  AKI secondary to Prerenal/ATN  AKI on CKD               CKD stage 3.               CKD since 2014               CKD secondary to DM/HTN                Progression of CKD marked with AKI                Proteinura  Present                               AKI better                Creat trending down  2)HTN  Medication-  On Diuretics   3)Anemia HGb low Primary MD following   4)CKD Mineral-Bone Disorder  Phosphorus at goal. Calcium is at goal.  5)DM- Primary MD following  6)Electrolytes  Hyperkalemic     Now much better  Hyponatremic     Sec to HYperglycemia  7)Acid base Co2 at goal     Plan:  Will lower IVF rate Will change lasix IV to PO Will follow bmet      [place] 12/26/2015, 9:06 AM

## 2015-12-26 NOTE — Progress Notes (Signed)
Inpatient Diabetes Program Recommendations  AACE/ADA: New Consensus Statement on Inpatient Glycemic Control (2015)  Target Ranges:  Prepandial:   less than 140 mg/dL      Peak postprandial:   less than 180 mg/dL (1-2 hours)      Critically ill patients:  140 - 180 mg/dL   Lab Results  Component Value Date   GLUCAP 213 (H) 12/26/2015   HGBA1C 9.0 (H) 12/24/2015    Review of Glycemic Control  Diabetes history: Type 2, A1C 9% Outpatient Diabetes medications: Glucotrol 10mg /day, Glucophage 500mg  qam, 1000mg  qpm, Januvia 100mg /day Current orders for Inpatient glycemic control: Novolog Moderate TID  Inpatient Diabetes Program Recommendations:  Glucose 306 last night. Please consider ordering Novolog HS (0-5 units) scale. May also need Novolog 3 units meal coverage if glucose remains elevated or increases around meal times.  Thanks,  Tama Headings RN, MSN, [facility] Inpatient Diabetes Coordinator Team Pager [phone] (8a-5p)

## 2015-12-26 NOTE — Progress Notes (Signed)
[facility] Progress Note  Patient: David Zamora ZOX:[id number]   PCP: No PCP Per Patient DOB: Mar 17, 1961   DOA: 12/24/2015   DOS: 12/26/2015   Date of Service: the patient was seen and examined on 12/26/2015  Subjective: Patient is feeling fine and wants to go home. No nausea no vomiting. Nutrition: Tolerating oral diet  Brief hospital course: Pt. with PMH of DM, HTN, intellectual disability; admitted on 12/24/2015, with complaint of abnormal lab, was found to have hyperkalemia with acute kidney injury. Currently further plan is continue Lasix.  Assessment and Plan: 1. Hyperkalemia Significantly improved and currently resolved. Appreciate nephrology input. Patient currently on oral Lasix. Fluids are stopped. We'll continue to monitor daily potassium.  2. Uncontrolled diabetes mellitus. Blood sugars are elevated. We will add Lantus. Continue sliding scale insulin.  3. Essential hypertension. Blood pressure is stable. Continue to monitor.  4. Mood disorder. Continue Wellbutrin Seroquel and Depakote.  Pain management: When necessary Tylenol Activity: No indication for physical therapy Bowel regimen: last BM 12/25/2015 Diet: Cardiac and carb modified diet DVT Prophylaxis: subcutaneous Heparin  Advance goals of care discussion: Full code  Family Communication: no family was present at bedside, at the time of interview.  Disposition:  Discharge to group home. Expected discharge date: 12/27/2015, improvement in renal function and potassium  Consultants: Nephrology Procedures: None  Antibiotics: Anti-infectives    None        Intake/Output Summary (Last 24 hours) at 12/26/15 1658 Last data filed at 12/26/15 1407  Gross per 24 hour  Intake             3480 ml  Output             4725 ml  Net            -1245 ml   Filed Weights   12/24/15 2100 12/25/15 0500 12/26/15 0500  Weight: 90.9 kg (200 lb 6.4 oz) 92.1 kg (203 lb 0.7 oz) 91.2 kg (201 lb 1 oz)     Objective: Physical Exam: Vitals:   12/26/15 0500 12/26/15 0730 12/26/15 1158 12/26/15 1639  BP:    134/81  Pulse:    82  Resp:    20  Temp:  97.8 F (36.6 C) 97 F (36.1 C) 98 F (36.7 C)  TempSrc:  Oral Oral Oral  SpO2:    100%  Weight: 91.2 kg (201 lb 1 oz)     Height:        General: Alert, Awake and Oriented to Time, Place and Person. Appear in mild distress Eyes: PERRL, Conjunctiva normal ENT: Oral Mucosa clear moist. Neck: no JVD, no Abnormal Mass Or lumps Cardiovascular: S1 and S2 Present, no Murmur, Respiratory: Bilateral Air entry equal and Decreased, Clear to Auscultation, no Crackles, no wheezes Abdomen: Bowel Sound present, Soft and no tenderness Skin: no redness, no Rash  Extremities: no Pedal edema, no calf tenderness Neurologic: Grossly no focal neuro deficit. Bilaterally Equal motor strength  Data Reviewed: CBC:  Recent Labs Lab 12/24/15 1530 12/25/15 0445  WBC 8.3 7.0  NEUTROABS 7.1  --   HGB 12.9* 10.1*  HCT 40.6 30.6*  MCV 100.0 98.4  PLT 228 811   Basic Metabolic Panel:  Recent Labs Lab 12/24/15 2053 12/24/15 2223 12/24/15 2354 12/25/15 0445 12/25/15 1753 12/26/15 0406  NA 131* 133* 136 137 133* 134*  K 6.1* 5.7* 5.0 4.6 4.0 3.7  CL 111 107 107 110 100* 98*  CO2 17* 20* 21* 23 22 25  GLUCOSE 275* 227* 159* 143* 287* 265*  BUN 54* 53* 52* 48* 41* 36*  CREATININE 2.46* 2.36* 2.39* 2.02* 2.02* 1.79*  CALCIUM 8.8* 8.9 9.5 8.6* 8.9 8.7*  PHOS 1.3* 1.4* 2.2* 2.7  --  3.4    Liver Function Tests:  Recent Labs Lab 12/24/15 1530  12/24/15 2053 12/24/15 2223 12/24/15 2354 12/25/15 0445 12/26/15 0406  AST 29  --   --   --   --   --   --   ALT 33  --   --   --   --   --   --   ALKPHOS 60  --   --   --   --   --   --   BILITOT 0.9  --   --   --   --   --   --   PROT 8.5*  --   --   --   --   --   --   ALBUMIN 5.0  < > 4.2 3.7 4.1 3.4* 3.5  < > = values in this interval not displayed.  Recent Labs Lab 12/24/15 1530   LIPASE 63*   No results for input(s): AMMONIA in the last 168 hours. Coagulation Profile: No results for input(s): INR, PROTIME in the last 168 hours. Cardiac Enzymes: No results for input(s): CKTOTAL, CKMB, CKMBINDEX, TROPONINI in the last 168 hours. BNP (last 3 results) No results for input(s): PROBNP in the last 8760 hours.  CBG:  Recent Labs Lab 12/25/15 1652 12/25/15 2114 12/26/15 0803 12/26/15 1111 12/26/15 1642  GLUCAP 242* 306* 213* 334* 238*    Studies: No results found.   Scheduled Meds: . aspirin EC  81 mg Oral Daily  . buPROPion  150 mg Oral q morning - 10a  . busPIRone  10 mg Oral BID  . divalproex  250 mg Oral Daily   And  . divalproex  1,000 mg Oral QHS  . enoxaparin (LOVENOX) injection  40 mg Subcutaneous Q24H  . fenofibrate  160 mg Oral Daily  . furosemide  40 mg Oral BID  . insulin aspart  0-15 Units Subcutaneous TID WC  . insulin glargine  10 Units Subcutaneous Daily  . pantoprazole  40 mg Oral Daily  . PARoxetine  20 mg Oral q morning - 10a  . polyvinyl alcohol  1 drop Both Eyes QID  . QUEtiapine  300 mg Oral Q1500  . simvastatin  40 mg Oral QHS  . sodium chloride flush  3 mL Intravenous Q12H  . tamsulosin  0.4 mg Oral Q breakfast   Continuous Infusions: . sodium chloride 75 mL/hr (12/26/15 0936)   PRN Meds: acetaminophen **OR** acetaminophen, hydrALAZINE, ondansetron **OR** ondansetron (ZOFRAN) IV  Time spent: 30 minutes  Author: Berle Mull, MD [facility] Pager: [phone] 12/26/2015 4:58 PM  If 7PM-7AM, please contact night-coverage at [email or web address], password [facility]

## 2015-12-26 NOTE — Progress Notes (Signed)
CBG 524,  Stat Blood Glucose ordered per protocol.  Stat blood glucose 480, Midlevel coverage notified.

## 2015-12-26 NOTE — Care Management Important Message (Signed)
Important Message  Patient Details  Name: David Zamora MRN: [id number] Date of Birth: [birth year]   Medicare Important Message Given:  Yes    Sherald Barge, RN 12/26/2015, 12:44 PM

## 2015-12-27 LAB — RENAL FUNCTION PANEL
ALBUMIN: 3.5 g/dL (ref 3.5–5.0)
Anion gap: 9 (ref 5–15)
BUN: 26 mg/dL — AB (ref 6–20)
CO2: 30 mmol/L (ref 22–32)
Calcium: 8.4 mg/dL — ABNORMAL LOW (ref 8.9–10.3)
Chloride: 97 mmol/L — ABNORMAL LOW (ref 101–111)
Creatinine, Ser: 1.55 mg/dL — ABNORMAL HIGH (ref 0.61–1.24)
GFR calc Af Amer: 56 mL/min — ABNORMAL LOW (ref >60)
GFR calc non Af Amer: 48 mL/min — ABNORMAL LOW (ref >60)
GLUCOSE: 207 mg/dL — AB (ref 65–99)
PHOSPHORUS: 3.9 mg/dL (ref 2.5–4.6)
POTASSIUM: 3.5 mmol/L (ref 3.5–5.1)
Sodium: 136 mmol/L (ref 135–145)

## 2015-12-27 LAB — GLUCOSE, CAPILLARY
GLUCOSE-CAPILLARY: 204 mg/dL — AB (ref 65–99)
GLUCOSE-CAPILLARY: 336 mg/dL — AB (ref 65–99)

## 2015-12-27 MED ORDER — FUROSEMIDE 40 MG PO TABS: 40.0000 mg | ORAL_TABLET | Freq: Every day | ORAL | Status: DC

## 2015-12-27 MED ORDER — LINAGLIPTIN 5 MG PO TABS
5.0000 mg | ORAL_TABLET | Freq: Every day | ORAL | Status: DC
  Administered 2015-12-27: 5 mg via ORAL
  Filled 2015-12-27: qty 1

## 2015-12-27 MED ORDER — GLIPIZIDE 5 MG PO TABS
5.0000 mg | ORAL_TABLET | Freq: Every day | ORAL | Status: DC
  Administered 2015-12-27: 5 mg via ORAL
  Filled 2015-12-27: qty 1

## 2015-12-27 MED ORDER — FUROSEMIDE 20 MG PO TABS: 20.0000 mg | ORAL_TABLET | Freq: Every day | ORAL | 0 refills | Status: DC

## 2015-12-27 MED ORDER — GLIPIZIDE ER 2.5 MG PO TB24: 2.5000 mg | ORAL_TABLET | Freq: Every day | ORAL | 0 refills | Status: DC

## 2015-12-27 NOTE — Progress Notes (Addendum)
Inpatient Diabetes Program Recommendations  AACE/ADA: New Consensus Statement on Inpatient Glycemic Control (2015)  Target Ranges:  Prepandial:   less than 140 mg/dL      Peak postprandial:   less than 180 mg/dL (1-2 hours)      Critically ill patients:  140 - 180 mg/dL   Lab Results  Component Value Date   GLUCAP 204 (H) 12/27/2015   HGBA1C 9.0 (H) 12/24/2015    Review of Glycemic Control   Results for MARSHAUN, LORTIE (MRN [id number]) as of 12/27/2015 08:39  Ref. Range 12/26/2015 08:03 12/26/2015 11:11 12/26/2015 16:42 12/26/2015 21:05 12/27/2015 07:48  Glucose-Capillary Latest Ref Range: 65 - 99 mg/dL 213 (H) 334 (H) 238 (H) 524 (HH) 204 (H)   Diabetes history:Type 2, A1C 9% Outpatient Diabetes medications: Glucotrol 10mg /day, Glucophage 500mg  qam, 1000mg  qpm, Januvia 100mg /day  Current orders for Inpatient glycemic control: Novolog Moderate (0-15 units)TID, Lantus 10 units qday  Inpatient Diabetes Program Recommendations:   Please consider adding Novolog 5 units tid meal coverage, continue Novolog correction as ordered. Consider increasing Lantus to 18 units qday (0.2units/kg)  Gentry Fitz, RN, [place], [place], CDE Diabetes Coordinator Inpatient Diabetes Program  [phone] (Team Pager) [phone] ([place]) 12/27/2015 8:44 AM

## 2015-12-27 NOTE — Progress Notes (Signed)
He Subjective: Interval History: Patient is feeling worse better. He didn't have any more nausea or vomiting. No abdominal pain.  Objective: Vital signs in last 24 hours: Temp:  [97 F (36.1 C)-98 F (36.7 C)] 97.8 F (36.6 C) (09/14 0444) Pulse Rate:  [67-84] 67 (09/14 0444) Resp:  [20] 20 (09/14 0444) BP: (98-134)/(58-81) 98/71 (09/14 0444) SpO2:  [98 %-100 %] 100 % (09/14 0444) Weight change:   Intake/Output from previous day: 09/13 0701 - 09/14 0700 In: 2835 [P.O.:1200; I.V.:1635] Out: 5725 [Urine:5725] Intake/Output this shift: Total I/O In: -  Out: 1100 [Urine:1100]  General appearance: alert, cooperative and no distress Resp: clear to auscultation bilaterally Cardio: regular rate and rhythm, S1, S2 normal, no murmur, click, rub or gallop GI: soft, non-tender; bowel sounds normal; no masses,  no organomegaly Extremities: extremities normal, atraumatic, no cyanosis or edema  Lab Results:  Recent Labs  12/24/15 1530 12/25/15 0445  WBC 8.3 7.0  HGB 12.9* 10.1*  HCT 40.6 30.6*  PLT 228 170   BMET:   Recent Labs  12/26/15 0406 12/26/15 2118 12/27/15 0641  NA 134*  --  136  K 3.7  --  3.5  CL 98*  --  97*  CO2 25  --  30  GLUCOSE 265* 480* 207*  BUN 36*  --  26*  CREATININE 1.79*  --  1.55*  CALCIUM 8.7*  --  8.4*   No results for input(s): PTH in the last 72 hours. Iron Studies: No results for input(s): IRON, TIBC, TRANSFERRIN, FERRITIN in the last 72 hours.  Studies/Results: US Renal  Result Date: 12/25/2015 CLINICAL DATA:  Renal failure both acute and chronic, history of hypertension, former smoker. EXAM: RENAL / URINARY TRACT ULTRASOUND COMPLETE COMPARISON:  None in PACs FINDINGS: Right Kidney: Length: 14.6 cm. The renal cortical echotexture is lower than that of the adjacent liver. There is no hydronephrosis. No stones are evident. Left Kidney: Length: 13.2 cm. There are multiple cortical as well as parapelvic cysts. An upper pole cortical cyst  measures 2.6 x 2.1 x 2.5 cm. A lower pole parapelvic cyst measures 1.5 x 1.3 x 1.5 cm. The renal cortical echotexture is normal. There is no hydronephrosis. Bladder: The partially distended urinary bladder is normal. Bilateral ureteral jets are observed. IMPRESSION: Normal renal cortical echotexture.  No hydronephrosis. Simple appearing cysts in the left kidney. Electronically Signed   By: David  Martinique M.D.   On: 12/25/2015 11:54    I have reviewed the patient's current medications.  Assessment/Plan: Problem #1 hyperkalemia: His potassium   has remained normal.Problem #2 renal failure: Possibly acute on chronic. His renal function continued to improve. Problem #3 metabolic acidosis: has corrected. Problem #4 hypertension: His blood pressure is reasonably controlled Problem #5 diabetes Problem #6 anemia: His hemoglobin slightly low. still is with no target goal. Problem #7 history of mental Retardation Problem #8 proteinuria: Nephrotic range. Possibly from diabetes. Plan: We'll decrease lasix to 40 mg po once a day 2] we'll check his renal panel in the morning 3]We will follow patient in 4 weeks when discharged   LOS: 3 days   Aylana Hirschfeld S 12/27/2015,8:41 AM

## 2015-12-27 NOTE — Discharge Summary (Signed)
[facility] Discharge Summary   Patient: David Zamora TDD:[id number]   PCP: No PCP Per Patient patient mentions he follows up with Dr. Eula Fried  DOB: 1961-02-18   Date of admission: 12/24/2015   Date of discharge:  12/27/2015    Discharge Diagnoses:  Principal Problem:   Hyperkalemia Active Problems:   Uncontrolled diabetes mellitus ([place])   Acute kidney injury superimposed on chronic kidney disease ([place])   Intellectual disability   Essential hypertension   Depression   GERD (gastroesophageal reflux disease)   Admitted From: Group home Disposition:  Group home  Recommendations for Outpatient Follow-up:  1. Please follow-up with PCP in one week to adjust diabetes regimen. 2. Please check blood glucose 3 times a day and maintain a log and follow-up with PCP with this log.  3. Please also get BMP checked in 1 week with PCP. 4. This follow-up with nephrology as recommended in 4 weeks.  Follow-up Information    [facility] S, MD Follow up in 4 week(s).   Specialty:  Nephrology Contact information: [address] [place] [address] [phone]        DR Eula Fried. Schedule an appointment as soon as possible for a visit in 1 week(s).   Why:  Follow-up on diabetes, hemoglobin A1c 9.0. BMP in 1 week         Diet recommendation: Carb modified diet  Activity: The patient is advised to gradually reintroduce usual activities.  Discharge Condition: good  Code Status: Full code  History of present illness: As per the H and P dictated on admission, "David Zamora is a 55 y.o. male with medical history significant of intellectual disability, DM, HTN presenting with severe hyperkalemia.  He attends a day program in [place] and his day program teacher reported that he vomited x 3.  They sent him to Urgent Care and labs were abnormal so they sent him to the ER - abnormal labs were K+ 9.0 (!), CO2 21, BUN 68, Creatinine 2.7, Glu 386.  He was given a shot of  Phenergan with improvement in the n/v.  Diffuse abdominal pain during episodes of vomiting.  Currently he states that he feels fine and has no pain.  Last peed before coming to the ER.  Struggled to make urine here.  Voided about 100 cc after effort.  It is tea-colored."  Hospital Course:  Summary of his active problems in the hospital is as following. 1. Hyperkalemia Significantly improved and currently resolved. Appreciate nephrology input. Patient currently on oral Lasix. Decrease in the dose of Lasix from 40 mg to 20 mg and recommended to take extra pill as and when needed for weight gain of 3 pounds in a day.  Recheck BMP in one week.  2. Uncontrolled diabetes mellitus. In the globin A1c 9.0 this admission. Blood sugars are elevated. Patient was given insulin with sliding scale here in the hospital. On discharge will resume Januvia, resume glipizide ER and increase the dose of glipizide to 12.5 mg from 10 mg. Recommended to follow-up with PCP in one week with blood sugar logs to adjust further dose. Recommend to monitor for hypoglycemia at the facility.  3. Essential hypertension. Blood pressure is stable. Continue to monitor.  4. Mood disorder. Continue Wellbutrin Seroquel and Depakote.  All other chronic medical condition were stable during the hospitalization.  Patient was ambulatory without any assistance. On the day of the discharge the patient's vitals were stable, and no other acute medical condition were reported by patient.  the patient was felt safe to be discharge at group home.  Procedures and Results:  None   Consultations:  Nephrology  DISCHARGE MEDICATION: Current Discharge Medication List    START taking these medications   Details  furosemide (LASIX) 20 MG tablet Take 1 tablet (20 mg total) by mouth daily. Take extra 20 mg a day for weight gain of more than 3 lbs Qty: 30 tablet, Refills: 0    !! glipiZIDE (GLUCOTROL XL) 2.5 MG 24 hr tablet Take  1 tablet (2.5 mg total) by mouth daily with breakfast. Total 12.5 mg daily Qty: 30 tablet, Refills: 0     !! - Potential duplicate medications found. Please discuss with provider.    CONTINUE these medications which have NOT CHANGED   Details  aspirin EC 81 MG tablet Take 81 mg by mouth daily.    buPROPion (WELLBUTRIN XL) 150 MG 24 hr tablet Take 150 mg by mouth every morning.     busPIRone (BUSPAR) 10 MG tablet Take 10 mg by mouth 2 (two) times daily.    Carboxymeth-Glycerin-Polysorb (REFRESH OPTIVE ADVANCED) 0.5-1-0.5 % SOLN Apply 1 drop to eye 4 (four) times daily.    divalproex (DEPAKOTE ER) 250 MG 24 hr tablet Take 250-1,000 mg by mouth 2 (two) times daily. 1 tablet in the morning and 4 tablets at bedtime    !! glipiZIDE (GLUCOTROL XL) 10 MG 24 hr tablet Take 10 mg by mouth daily with breakfast.    omeprazole (PRILOSEC) 20 MG capsule Take 20 mg by mouth every morning.     PARoxetine (PAXIL) 20 MG tablet Take 20 mg by mouth every morning.    QUEtiapine (SEROQUEL XR) 300 MG 24 hr tablet Take 300 mg by mouth daily. Taken daily at 4pm    simvastatin (ZOCOR) 40 MG tablet Take 40 mg by mouth at bedtime.      sitaGLIPtin (JANUVIA) 100 MG tablet Take 100 mg by mouth daily.    tamsulosin (FLOMAX) 0.4 MG CAPS capsule Take 0.4 mg by mouth daily with breakfast.    triamcinolone ointment (KENALOG) 0.5 % Apply 1 application topically 2 (two) times daily.     !! - Potential duplicate medications found. Please discuss with provider.    STOP taking these medications     fenofibrate (TRICOR) 145 MG tablet      metFORMIN (GLUCOPHAGE) 500 MG tablet      olmesartan (BENICAR) 20 MG tablet      triamterene-hydrochlorothiazide (MAXZIDE-25) 37.5-25 MG per tablet        No Known Allergies Discharge Instructions    Diet - low sodium heart healthy    Complete by:  As directed    Discharge instructions    Complete by:  As directed    Check your Weight same time everyday If you gain over  3 pounds, or you develop in leg swelling, experience more shortness of breath or chest pain, call your Primary MD immediately.  Avoid adding extra salt in the food, food high in salt and Follow 1.5 lit/day fluid restriction.   Increase activity slowly    Complete by:  As directed      Discharge Exam: Filed Weights   12/24/15 2100 12/25/15 0500 12/26/15 0500  Weight: 90.9 kg (200 lb 6.4 oz) 92.1 kg (203 lb 0.7 oz) 91.2 kg (201 lb 1 oz)   Vitals:   12/26/15 2109 12/27/15 0444  BP: (!) 119/58 98/71  Pulse: 84 67  Resp: 20 20  Temp: 98 F (36.7 C)  97.8 F (36.6 C)   General: Appear in No distress, no Rash; Oral Mucosa moist. Cardiovascular: S1 and S2 Present, no Murmur, no JVD Respiratory: Bilateral Air entry present and Clear to Auscultation, no Crackles, no wheezes Abdomen: Bowel Sound present, Soft and no tenderness Extremities: no Pedal edema, no calf tenderness Neurology: Grossly no focal neuro deficit.  The results of significant diagnostics from this hospitalization (including imaging, microbiology, ancillary and laboratory) are listed below for reference.    Significant Diagnostic Studies: US Renal  Result Date: 12/25/2015 CLINICAL DATA:  Renal failure both acute and chronic, history of hypertension, former smoker. EXAM: RENAL / URINARY TRACT ULTRASOUND COMPLETE COMPARISON:  None in PACs FINDINGS: Right Kidney: Length: 14.6 cm. The renal cortical echotexture is lower than that of the adjacent liver. There is no hydronephrosis. No stones are evident. Left Kidney: Length: 13.2 cm. There are multiple cortical as well as parapelvic cysts. An upper pole cortical cyst measures 2.6 x 2.1 x 2.5 cm. A lower pole parapelvic cyst measures 1.5 x 1.3 x 1.5 cm. The renal cortical echotexture is normal. There is no hydronephrosis. Bladder: The partially distended urinary bladder is normal. Bilateral ureteral jets are observed. IMPRESSION: Normal renal cortical echotexture.  No hydronephrosis.  Simple appearing cysts in the left kidney. Electronically Signed   By: David  Martinique M.D.   On: 12/25/2015 11:54    Microbiology: Recent Results (from the past 240 hour(s))  MRSA PCR Screening     Status: None   Collection Time: 12/24/15  9:44 PM  Result Value Ref Range Status   MRSA by PCR NEGATIVE NEGATIVE Final    Comment:        The GeneXpert MRSA Assay (FDA approved for NASAL specimens only), is one component of a comprehensive MRSA colonization surveillance program. It is not intended to diagnose MRSA infection nor to guide or monitor treatment for MRSA infections.      Labs: CBC:  Recent Labs Lab 12/24/15 1530 12/25/15 0445  WBC 8.3 7.0  NEUTROABS 7.1  --   HGB 12.9* 10.1*  HCT 40.6 30.6*  MCV 100.0 98.4  PLT 228 962   Basic Metabolic Panel:  Recent Labs Lab 12/24/15 2223 12/24/15 2354 12/25/15 0445 12/25/15 1753 12/26/15 0406 12/26/15 2118 12/27/15 0641  NA 133* 136 137 133* 134*  --  136  K 5.7* 5.0 4.6 4.0 3.7  --  3.5  CL 107 107 110 100* 98*  --  97*  CO2 20* 21* 23 22 25   --  30  GLUCOSE 227* 159* 143* 287* 265* 480* 207*  BUN 53* 52* 48* 41* 36*  --  26*  CREATININE 2.36* 2.39* 2.02* 2.02* 1.79*  --  1.55*  CALCIUM 8.9 9.5 8.6* 8.9 8.7*  --  8.4*  PHOS 1.4* 2.2* 2.7  --  3.4  --  3.9   Liver Function Tests:  Recent Labs Lab 12/24/15 1530  12/24/15 2223 12/24/15 2354 12/25/15 0445 12/26/15 0406 12/27/15 0641  AST 29  --   --   --   --   --   --   ALT 33  --   --   --   --   --   --   ALKPHOS 60  --   --   --   --   --   --   BILITOT 0.9  --   --   --   --   --   --   PROT 8.5*  --   --   --   --   --   --  ALBUMIN 5.0  < > 3.7 4.1 3.4* 3.5 3.5  < > = values in this interval not displayed.  Recent Labs Lab 12/24/15 1530  LIPASE 63*   CBG:  Recent Labs Lab 12/26/15 1111 12/26/15 1642 12/26/15 2105 12/27/15 0748 12/27/15 1216  GLUCAP 334* 238* 524* 204* 336*   Time spent: 30 minutes  Signed:  Lakenzie Mcclafferty,  Jalacia Mattila  [facility]  12/27/2015  , 12:43 PM

## 2015-12-27 NOTE — Clinical Social Work Note (Signed)
[name] notified Donesha at [facility] that patient was being discharged. She advised that the facility would pick him up.  [name] notified Blair Heys, [place], that patient was being discharged.    [name] signing off.     Jerod Mcquain, Clydene Pugh, LCSW

## 2015-12-27 NOTE — NC FL2 (Deleted)
[place] MEDICAID FL2 LEVEL OF CARE SCREENING TOOL     IDENTIFICATION  Patient Name: David Zamora Birthdate: 1960-06-11 Sex: male Admission Date (Current Location): 12/24/2015  [facility] and [place] Number:  [employer] and Address:  [place] [address], [place]      Provider Number: [id number]  Attending Physician Name and Address:  Lavina Hamman, MD  Relative Name and Phone Number:       Current Level of Care: Hospital Recommended Level of Care: [facility] Prior Approval Number:    Date Approved/Denied:   PASRR Number:    Discharge Plan: Other (Comment) [facility] )    Current Diagnoses: Patient Active Problem List   Diagnosis Date Noted  . Hyperkalemia 12/24/2015  . Uncontrolled diabetes mellitus ([place]) 12/24/2015  . Acute kidney injury superimposed on chronic kidney disease ([place]) 12/24/2015  . Intellectual disability 12/24/2015  . Essential hypertension 12/24/2015  . Depression 12/24/2015  . GERD (gastroesophageal reflux disease) 12/24/2015    Orientation RESPIRATION BLADDER Height & Weight     Self, Time, Situation, Place  Normal Continent Weight: 201 lb 1 oz (91.2 kg) Height:  5\' 5"  (165.1 cm)  BEHAVIORAL SYMPTOMS/MOOD NEUROLOGICAL BOWEL NUTRITION STATUS      Continent Diet (Carb Modified. )  AMBULATORY STATUS COMMUNICATION OF NEEDS Skin   Independent Verbally Normal                       Personal Care Assistance Level of Assistance  Bathing, Feeding, Dressing Bathing Assistance: Limited assistance Feeding assistance: Independent Dressing Assistance: Limited assistance     Functional Limitations Info  Sight, Hearing, Speech Sight Info: Adequate Hearing Info: Adequate Speech Info: Adequate    SPECIAL CARE FACTORS FREQUENCY                       Contractures Contractures Info: Not present    Additional Factors Info  Code Status, Psychotropic, Insulin Sliding Scale Code  Status Info: Full Code   Psychotropic Info: Paxil, Seroquel, Wellbutrin XL, Duspar, Depakote ER         Current Medications (12/27/2015):  This is the current hospital active medication list Current Facility-Administered Medications  Medication Dose Route Frequency Provider Last Rate Last Dose  . acetaminophen (TYLENOL) tablet 650 mg  650 mg Oral Q6H PRN Karmen Bongo, MD   650 mg at 12/25/15 1625   Or  . acetaminophen (TYLENOL) suppository 650 mg  650 mg Rectal Q6H PRN Karmen Bongo, MD      . aspirin EC tablet 81 mg  81 mg Oral Daily Karmen Bongo, MD   81 mg at 12/27/15 0933  . buPROPion (WELLBUTRIN XL) 24 hr tablet 150 mg  150 mg Oral q morning - 10a Karmen Bongo, MD   150 mg at 12/27/15 1000  . busPIRone (BUSPAR) tablet 10 mg  10 mg Oral BID Karmen Bongo, MD   10 mg at 12/27/15 0933  . divalproex (DEPAKOTE ER) 24 hr tablet 250 mg  250 mg Oral Daily Karmen Bongo, MD   250 mg at 12/27/15 0933   And  . divalproex (DEPAKOTE ER) 24 hr tablet 1,000 mg  1,000 mg Oral QHS Karmen Bongo, MD   1,000 mg at 12/26/15 2046  . enoxaparin (LOVENOX) injection 40 mg  40 mg Subcutaneous Q24H Karmen Bongo, MD   40 mg at 12/26/15 2048  . [START ON 12/28/2015] furosemide (LASIX) tablet 40  mg  40 mg Oral Daily Fran Lowes, MD      . glipiZIDE (GLUCOTROL) tablet 5 mg  5 mg Oral QAC breakfast Lavina Hamman, MD   5 mg at 12/27/15 0939  . insulin aspart (novoLOG) injection 0-15 Units  0-15 Units Subcutaneous TID [facility] Wallis Bamberg, MD   11 Units at 12/27/15 1244  . insulin glargine (LANTUS) injection 10 Units  10 Units Subcutaneous Daily Lavina Hamman, MD   10 Units at 12/27/15 0934  . linagliptin (TRADJENTA) tablet 5 mg  5 mg Oral Daily Lavina Hamman, MD   5 mg at 12/27/15 0939  . ondansetron (ZOFRAN) tablet 4 mg  4 mg Oral Q6H PRN Karmen Bongo, MD       Or  . ondansetron [facility]) injection 4 mg  4 mg Intravenous Q6H PRN Karmen Bongo, MD      . pantoprazole (PROTONIX) EC tablet 40  mg  40 mg Oral Daily Karmen Bongo, MD   40 mg at 12/27/15 0933  . PARoxetine (PAXIL) tablet 20 mg  20 mg Oral q morning - 10a Karmen Bongo, MD   20 mg at 12/27/15 0932  . polyvinyl alcohol (LIQUIFILM TEARS) 1.4 % ophthalmic solution 1 drop  1 drop Both Eyes QID Karmen Bongo, MD   1 drop at 12/27/15 0934  . QUEtiapine (SEROQUEL XR) 24 hr tablet 300 mg  300 mg Oral Q1500 Karmen Bongo, MD   300 mg at 12/26/15 1630  . simvastatin (ZOCOR) tablet 40 mg  40 mg Oral QHS Karmen Bongo, MD   40 mg at 12/26/15 2047  . sodium chloride flush (NS) 0.9 % injection 3 mL  3 mL Intravenous Q12H Karmen Bongo, MD   3 mL at 12/26/15 2200  . tamsulosin (FLOMAX) capsule 0.4 mg  0.4 mg Oral Q breakfast Karmen Bongo, MD   0.4 mg at 12/27/15 7169     Discharge Medications: Current Discharge Medication List        START taking these medications   Details  furosemide (LASIX) 20 MG tablet Take 1 tablet (20 mg total) by mouth daily. Take extra 20 mg a day for weight gain of more than 3 lbs Qty: 30 tablet, Refills: 0    !! glipiZIDE (GLUCOTROL XL) 2.5 MG 24 hr tablet Take 1 tablet (2.5 mg total) by mouth daily with breakfast. Total 12.5 mg daily Qty: 30 tablet, Refills: 0     !! - Potential duplicate medications found. Please discuss with provider.        CONTINUE these medications which have NOT CHANGED   Details  aspirin EC 81 MG tablet Take 81 mg by mouth daily.    buPROPion (WELLBUTRIN XL) 150 MG 24 hr tablet Take 150 mg by mouth every morning.     busPIRone (BUSPAR) 10 MG tablet Take 10 mg by mouth 2 (two) times daily.    Carboxymeth-Glycerin-Polysorb (REFRESH OPTIVE ADVANCED) 0.5-1-0.5 % SOLN Apply 1 drop to eye 4 (four) times daily.    divalproex (DEPAKOTE ER) 250 MG 24 hr tablet Take 250-1,000 mg by mouth 2 (two) times daily. 1 tablet in the morning and 4 tablets at bedtime    !! glipiZIDE (GLUCOTROL XL) 10 MG 24 hr tablet Take 10 mg by mouth daily with breakfast.     omeprazole (PRILOSEC) 20 MG capsule Take 20 mg by mouth every morning.     PARoxetine (PAXIL) 20 MG tablet Take 20 mg by mouth every morning.    QUEtiapine (SEROQUEL  XR) 300 MG 24 hr tablet Take 300 mg by mouth daily. Taken daily at 4pm    simvastatin (ZOCOR) 40 MG tablet Take 40 mg by mouth at bedtime.      sitaGLIPtin (JANUVIA) 100 MG tablet Take 100 mg by mouth daily.    tamsulosin (FLOMAX) 0.4 MG CAPS capsule Take 0.4 mg by mouth daily with breakfast.    triamcinolone ointment (KENALOG) 0.5 % Apply 1 application topically 2 (two) times daily.     !! - Potential duplicate medications found. Please discuss with provider.       STOP taking these medications     fenofibrate (TRICOR) 145 MG tablet      metFORMIN (GLUCOPHAGE) 500 MG tablet      olmesartan (BENICAR) 20 MG tablet      triamterene-hydrochlorothiazide (MAXZIDE-25) 37.5-25 MG per tablet          Relevant Imaging Results:  Relevant Lab Results:   Additional Information    Salome Arnt, [place]

## 2015-12-27 NOTE — NC FL2 (Signed)
[place] MEDICAID FL2 LEVEL OF CARE SCREENING TOOL     IDENTIFICATION  Patient Name: David Zamora Birthdate: October 10, 1960 Sex: male Admission Date (Current Location): 12/24/2015  [facility] and [place] Number:  [employer] and Address:  [place] [address], [place]      Provider Number: [id number]  Attending Physician Name and Address:  Lavina Hamman, MD  Relative Name and Phone Number:       Current Level of Care: Hospital Recommended Level of Care: [facility] Prior Approval Number:    Date Approved/Denied:   PASRR Number:    Discharge Plan: Other (Comment) [facility] )    Current Diagnoses: Patient Active Problem List   Diagnosis Date Noted  . Hyperkalemia 12/24/2015  . Uncontrolled diabetes mellitus ([place]) 12/24/2015  . Acute kidney injury superimposed on chronic kidney disease ([place]) 12/24/2015  . Intellectual disability 12/24/2015  . Essential hypertension 12/24/2015  . Depression 12/24/2015  . GERD (gastroesophageal reflux disease) 12/24/2015    Orientation RESPIRATION BLADDER Height & Weight     Self, Time, Situation, Place  Normal Continent Weight: 201 lb 1 oz (91.2 kg) Height:  5\' 5"  (165.1 cm)  BEHAVIORAL SYMPTOMS/MOOD NEUROLOGICAL BOWEL NUTRITION STATUS      Continent Diet (Carb Modified. )  AMBULATORY STATUS COMMUNICATION OF NEEDS Skin   Independent Verbally Normal                       Personal Care Assistance Level of Assistance  Bathing, Feeding, Dressing Bathing Assistance: Limited assistance Feeding assistance: Independent Dressing Assistance: Limited assistance     Functional Limitations Info  Sight, Hearing, Speech Sight Info: Adequate Hearing Info: Adequate Speech Info: Adequate    SPECIAL CARE FACTORS FREQUENCY                       Contractures Contractures Info: Not present    Additional Factors Info  Code Status, Psychotropic, Insulin Sliding Scale Code  Status Info: Full Code   Psychotropic Info: Paxil, Seroquel, Wellbutrin XL, Duspar, Depakote ER         Current Medications (12/27/2015):  This is the current hospital active medication list Current Facility-Administered Medications  Medication Dose Route Frequency Provider Last Rate Last Dose  . acetaminophen (TYLENOL) tablet 650 mg  650 mg Oral Q6H PRN Karmen Bongo, MD   650 mg at 12/25/15 1625   Or  . acetaminophen (TYLENOL) suppository 650 mg  650 mg Rectal Q6H PRN Karmen Bongo, MD      . aspirin EC tablet 81 mg  81 mg Oral Daily Karmen Bongo, MD   81 mg at 12/27/15 0933  . buPROPion (WELLBUTRIN XL) 24 hr tablet 150 mg  150 mg Oral q morning - 10a Karmen Bongo, MD   150 mg at 12/27/15 1000  . busPIRone (BUSPAR) tablet 10 mg  10 mg Oral BID Karmen Bongo, MD   10 mg at 12/27/15 0933  . divalproex (DEPAKOTE ER) 24 hr tablet 250 mg  250 mg Oral Daily Karmen Bongo, MD   250 mg at 12/27/15 0933   And  . divalproex (DEPAKOTE ER) 24 hr tablet 1,000 mg  1,000 mg Oral QHS Karmen Bongo, MD   1,000 mg at 12/26/15 2046  . enoxaparin (LOVENOX) injection 40 mg  40 mg Subcutaneous Q24H Karmen Bongo, MD   40 mg at 12/26/15 2048  . [START ON 12/28/2015] furosemide (LASIX) tablet 40  mg  40 mg Oral Daily Fran Lowes, MD      . glipiZIDE (GLUCOTROL) tablet 5 mg  5 mg Oral QAC breakfast Lavina Hamman, MD   5 mg at 12/27/15 0939  . insulin aspart (novoLOG) injection 0-15 Units  0-15 Units Subcutaneous TID [facility] Wallis Bamberg, MD   11 Units at 12/27/15 1244  . insulin glargine (LANTUS) injection 10 Units  10 Units Subcutaneous Daily Lavina Hamman, MD   10 Units at 12/27/15 0934  . linagliptin (TRADJENTA) tablet 5 mg  5 mg Oral Daily Lavina Hamman, MD   5 mg at 12/27/15 0939  . ondansetron (ZOFRAN) tablet 4 mg  4 mg Oral Q6H PRN Karmen Bongo, MD       Or  . ondansetron [facility]) injection 4 mg  4 mg Intravenous Q6H PRN Karmen Bongo, MD      . pantoprazole (PROTONIX) EC tablet 40  mg  40 mg Oral Daily Karmen Bongo, MD   40 mg at 12/27/15 0933  . PARoxetine (PAXIL) tablet 20 mg  20 mg Oral q morning - 10a Karmen Bongo, MD   20 mg at 12/27/15 0932  . polyvinyl alcohol (LIQUIFILM TEARS) 1.4 % ophthalmic solution 1 drop  1 drop Both Eyes QID Karmen Bongo, MD   1 drop at 12/27/15 0934  . QUEtiapine (SEROQUEL XR) 24 hr tablet 300 mg  300 mg Oral Q1500 Karmen Bongo, MD   300 mg at 12/26/15 1630  . simvastatin (ZOCOR) tablet 40 mg  40 mg Oral QHS Karmen Bongo, MD   40 mg at 12/26/15 2047  . sodium chloride flush (NS) 0.9 % injection 3 mL  3 mL Intravenous Q12H Karmen Bongo, MD   3 mL at 12/26/15 2200  . tamsulosin (FLOMAX) capsule 0.4 mg  0.4 mg Oral Q breakfast Karmen Bongo, MD   0.4 mg at 12/27/15 9983     Discharge Medications: DISCHARGE MEDICATION:     Current Discharge Medication List        START taking these medications   Details  furosemide (LASIX) 20 MG tablet Take 1 tablet (20 mg total) by mouth daily. Take extra 20 mg a day for weight gain of more than 3 lbs Qty: 30 tablet, Refills: 0    !! glipiZIDE (GLUCOTROL XL) 2.5 MG 24 hr tablet Take 1 tablet (2.5 mg total) by mouth daily with breakfast. Total 12.5 mg daily Qty: 30 tablet, Refills: 0     !! - Potential duplicate medications found. Please discuss with provider.        CONTINUE these medications which have NOT CHANGED   Details  aspirin EC 81 MG tablet Take 81 mg by mouth daily.    buPROPion (WELLBUTRIN XL) 150 MG 24 hr tablet Take 150 mg by mouth every morning.     busPIRone (BUSPAR) 10 MG tablet Take 10 mg by mouth 2 (two) times daily.    Carboxymeth-Glycerin-Polysorb (REFRESH OPTIVE ADVANCED) 0.5-1-0.5 % SOLN Apply 1 drop to eye 4 (four) times daily.    divalproex (DEPAKOTE ER) 250 MG 24 hr tablet Take 250-1,000 mg by mouth 2 (two) times daily. 1 tablet in the morning and 4 tablets at bedtime    !! glipiZIDE (GLUCOTROL XL) 10 MG 24 hr tablet Take 10 mg by mouth daily  with breakfast.    omeprazole (PRILOSEC) 20 MG capsule Take 20 mg by mouth every morning.     PARoxetine (PAXIL) 20 MG tablet Take 20 mg by mouth every  morning.    QUEtiapine (SEROQUEL XR) 300 MG 24 hr tablet Take 300 mg by mouth daily. Taken daily at 4pm    simvastatin (ZOCOR) 40 MG tablet Take 40 mg by mouth at bedtime.      sitaGLIPtin (JANUVIA) 100 MG tablet Take 100 mg by mouth daily.    tamsulosin (FLOMAX) 0.4 MG CAPS capsule Take 0.4 mg by mouth daily with breakfast.    triamcinolone ointment (KENALOG) 0.5 % Apply 1 application topically 2 (two) times daily.     !! - Potential duplicate medications found. Please discuss with provider.       STOP taking these medications     fenofibrate (TRICOR) 145 MG tablet      metFORMIN (GLUCOPHAGE) 500 MG tablet      olmesartan (BENICAR) 20 MG tablet      triamterene-hydrochlorothiazide (MAXZIDE-25) 37.5-25 MG per tablet          Relevant Imaging Results:  Relevant Lab Results:   Additional Information    Salome Arnt, [place]

## 2016-01-21 DIAGNOSIS — F70 Mild intellectual disabilities: Secondary | ICD-10-CM | POA: Diagnosis not present

## 2016-02-01 DIAGNOSIS — E1142 Type 2 diabetes mellitus with diabetic polyneuropathy: Secondary | ICD-10-CM | POA: Diagnosis not present

## 2016-02-01 DIAGNOSIS — L851 Acquired keratosis [keratoderma] palmaris et plantaris: Secondary | ICD-10-CM | POA: Diagnosis not present

## 2016-02-01 DIAGNOSIS — B351 Tinea unguium: Secondary | ICD-10-CM | POA: Diagnosis not present

## 2016-02-18 DIAGNOSIS — Z0000 Encounter for general adult medical examination without abnormal findings: Secondary | ICD-10-CM | POA: Diagnosis not present

## 2016-02-18 DIAGNOSIS — E1165 Type 2 diabetes mellitus with hyperglycemia: Secondary | ICD-10-CM | POA: Diagnosis not present

## 2016-02-18 DIAGNOSIS — Z79899 Other long term (current) drug therapy: Secondary | ICD-10-CM | POA: Diagnosis not present

## 2016-02-18 DIAGNOSIS — Z1389 Encounter for screening for other disorder: Secondary | ICD-10-CM | POA: Diagnosis not present

## 2016-02-18 DIAGNOSIS — Z299 Encounter for prophylactic measures, unspecified: Secondary | ICD-10-CM | POA: Diagnosis not present

## 2016-02-18 DIAGNOSIS — Z206 Contact with and (suspected) exposure to human immunodeficiency virus [HIV]: Secondary | ICD-10-CM | POA: Diagnosis not present

## 2016-02-18 DIAGNOSIS — Z7189 Other specified counseling: Secondary | ICD-10-CM | POA: Diagnosis not present

## 2016-03-03 DIAGNOSIS — I10 Essential (primary) hypertension: Secondary | ICD-10-CM | POA: Diagnosis not present

## 2016-03-03 DIAGNOSIS — D509 Iron deficiency anemia, unspecified: Secondary | ICD-10-CM | POA: Diagnosis not present

## 2016-03-03 DIAGNOSIS — R809 Proteinuria, unspecified: Secondary | ICD-10-CM | POA: Diagnosis not present

## 2016-03-03 DIAGNOSIS — N183 Chronic kidney disease, stage 3 (moderate): Secondary | ICD-10-CM | POA: Diagnosis not present

## 2016-03-10 DIAGNOSIS — N179 Acute kidney failure, unspecified: Secondary | ICD-10-CM | POA: Diagnosis not present

## 2016-03-10 DIAGNOSIS — E1129 Type 2 diabetes mellitus with other diabetic kidney complication: Secondary | ICD-10-CM | POA: Diagnosis not present

## 2016-03-10 DIAGNOSIS — E875 Hyperkalemia: Secondary | ICD-10-CM | POA: Diagnosis not present

## 2016-03-10 DIAGNOSIS — I10 Essential (primary) hypertension: Secondary | ICD-10-CM | POA: Diagnosis not present

## 2016-03-26 ENCOUNTER — Ambulatory Visit (INDEPENDENT_AMBULATORY_CARE_PROVIDER_SITE_OTHER): Payer: Medicare Other | Admitting: Family Medicine

## 2016-03-26 ENCOUNTER — Encounter: Payer: Self-pay | Admitting: Family Medicine

## 2016-03-26 VITALS — BP 122/74 | HR 75 | Temp 98.1°F | Ht 65.0 in | Wt 211.5 lb

## 2016-03-26 DIAGNOSIS — I10 Essential (primary) hypertension: Secondary | ICD-10-CM

## 2016-03-26 DIAGNOSIS — N183 Chronic kidney disease, stage 3 unspecified: Secondary | ICD-10-CM

## 2016-03-26 DIAGNOSIS — Z23 Encounter for immunization: Secondary | ICD-10-CM | POA: Diagnosis not present

## 2016-03-26 DIAGNOSIS — N179 Acute kidney failure, unspecified: Secondary | ICD-10-CM | POA: Diagnosis not present

## 2016-03-26 DIAGNOSIS — E1122 Type 2 diabetes mellitus with diabetic chronic kidney disease: Secondary | ICD-10-CM | POA: Diagnosis not present

## 2016-03-26 DIAGNOSIS — N189 Chronic kidney disease, unspecified: Secondary | ICD-10-CM

## 2016-03-26 DIAGNOSIS — IMO0002 Reserved for concepts with insufficient information to code with codable children: Secondary | ICD-10-CM

## 2016-03-26 DIAGNOSIS — E1165 Type 2 diabetes mellitus with hyperglycemia: Secondary | ICD-10-CM | POA: Diagnosis not present

## 2016-03-26 MED ORDER — DULAGLUTIDE 1.5 MG/0.5ML ~~LOC~~ SOAJ: 1.5000 mg | SUBCUTANEOUS | 3 refills | Status: DC

## 2016-03-26 MED ORDER — DULAGLUTIDE 0.75 MG/0.5ML ~~LOC~~ SOAJ: 0.7500 mg | SUBCUTANEOUS | 0 refills | Status: DC

## 2016-03-26 NOTE — Progress Notes (Signed)
BP 122/74   Pulse 75   Temp 98.1 F (36.7 C) (Oral)   Ht 5\' 5"  (1.651 m)   Wt 211 lb 8 oz (95.9 kg)   BMI 35.20 kg/m    Subjective:    Patient ID: David Zamora, male    DOB: 06/11/1960, 55 y.o.   MRN: [id number]  HPI: David Zamora is a 55 y.o. male presenting on 03/26/2016 for Establish Care   HPI Hypertension check Patient is coming in to establish care with our office as a new patient. He is coming in to get his blood pressure rechecked. His blood pressure today is 122/74. He is currently diet controlled for hypertension. He has been on medication previously but has not needed it in some time. He does have known renal complications which may be due to his blood pressure or his uncontrolled diabetes. Patient denies headaches, blurred vision, chest pains, shortness of breath, or weakness. Denies any side effects from medication and is content with current medication.   Type 2 diabetes/uncontrolled Patient is coming in to establish care for his type 2 diabetes as well. He was on other medications than what he has and previously but because of his renal damage he had to stop some of those medications recently after hospital visit. He is seen a nephrologist and they are monitoring his renal damage to see if it will stabilize her go away. So far looks like it is stabilized. He used to be on metformin but that was one of the medications that they decided to stop. He is currently on Januvia and glipizide. He cannot recall the last time that I ophthalmologist but thinks it was this year, we will find out for sure when we get the records requested from his previous physician. Per our records he had a microalbumin in September 2017 and a hemoglobin A1c of 9.0 at that time. Since he has stopped some of his medications and his blood sugar is been averaging in the 300s and 400s. He denies any new issues with his feet.  Acute versus chronic kidney disease Patient and a recent hospital stay was found  to have elevated creatinine and poor kidney function which has improved slightly since the hospital stay as the nephrologist is monitoring it but not to the level that they had hoped. It seems like it is stabilized over the last couple of lab draws. The nephrologist is managing this for the most part so we will follow along and appreciate their help for this patient.  Relevant past medical, surgical, family and social history reviewed and updated as indicated. Interim medical history since our last visit reviewed. Allergies and medications reviewed and updated.  Review of Systems  Constitutional: Negative for chills and fever.  Respiratory: Negative for shortness of breath and wheezing.   Cardiovascular: Negative for chest pain and leg swelling.  Musculoskeletal: Negative for back pain and gait problem.  Skin: Negative for rash.  Neurological: Negative for dizziness, weakness, light-headedness, numbness and headaches.  All other systems reviewed and are negative.   Per HPI unless specifically indicated above  Social History   Social History  . Marital status: Single    Spouse name: N/A  . Number of children: N/A  . Years of education: N/A   Occupational History  . Not on file.   Social History Main Topics  . Smoking status: Former Smoker    Packs/day: 0.25    Types: Cigarettes    Quit date: 2012  .  Smokeless tobacco: Never Used  . Alcohol use No     Comment: Denies  . Drug use: No     Comment: Denies   . Sexual activity: Not on file   Other Topics Concern  . Not on file   Social History Narrative   Patient is mentally retarded.  He is a ward of the state.  Has a guardian:  [name],     Past Surgical History:  Procedure Laterality Date  . CYST REMOVAL TRUNK    . FINGER FRACTURE SURGERY  02/22/2013    Family History  Problem Relation Age of Onset  . COPD Mother   . Diabetes Mother   . Heart disease Mother   . Early death Father   . Heart attack Father     . Heart disease Father       Medication List       Accurate as of 03/26/16 10:24 AM. Always use your most recent med list.          aspirin EC 81 MG tablet Take 81 mg by mouth daily.   buPROPion 150 MG 24 hr tablet Commonly known as:  WELLBUTRIN XL Take 150 mg by mouth every morning.   busPIRone 10 MG tablet Commonly known as:  BUSPAR Take 10 mg by mouth 2 (two) times daily.   divalproex 250 MG 24 hr tablet Commonly known as:  DEPAKOTE ER Take 250-1,000 mg by mouth 2 (two) times daily. 1 tablet in the morning and 4 tablets at bedtime   Dulaglutide 1.5 MG/0.5ML Sopn Commonly known as:  TRULICITY Inject 1.5 mg into the skin once a week.   Dulaglutide 0.75 MG/0.5ML Sopn Commonly known as:  TRULICITY Inject 3.15 mg into the skin once a week.   furosemide 20 MG tablet Commonly known as:  LASIX Take 1 tablet (20 mg total) by mouth daily. Take extra 20 mg a day for weight gain of more than 3 lbs   glipiZIDE 10 MG 24 hr tablet Commonly known as:  GLUCOTROL XL Take 10 mg by mouth daily with breakfast.   glipiZIDE 2.5 MG 24 hr tablet Commonly known as:  GLUCOTROL XL Take 1 tablet (2.5 mg total) by mouth daily with breakfast. Total 12.5 mg daily   omeprazole 20 MG capsule Commonly known as:  PRILOSEC Take 20 mg by mouth every morning.   PARoxetine 20 MG tablet Commonly known as:  PAXIL Take 20 mg by mouth every morning.   QUEtiapine 300 MG 24 hr tablet Commonly known as:  SEROQUEL XR Take 300 mg by mouth daily. Taken daily at [facility] 0.5-1-0.5 % Soln Generic drug:  Carboxymeth-Glycerin-Polysorb Apply 1 drop to eye 4 (four) times daily.   simvastatin 40 MG tablet Commonly known as:  ZOCOR Take 40 mg by mouth at bedtime.   tamsulosin 0.4 MG Caps capsule Commonly known as:  FLOMAX Take 0.4 mg by mouth daily with breakfast.   triamcinolone ointment 0.5 % Commonly known as:  KENALOG Apply 1 application topically 2 (two) times daily.           Objective:    BP 122/74   Pulse 75   Temp 98.1 F (36.7 C) (Oral)   Ht 5\' 5"  (1.651 m)   Wt 211 lb 8 oz (95.9 kg)   BMI 35.20 kg/m   Wt Readings from Last 3 Encounters:  03/26/16 211 lb 8 oz (95.9 kg)  12/26/15 201 lb 1 oz (91.2 kg)  09/16/14  205 lb (93 kg)    Physical Exam  Constitutional: He is oriented to person, place, and time. He appears well-developed and well-nourished. No distress.  Eyes: Conjunctivae are normal.  Cardiovascular: Normal rate, regular rhythm, normal heart sounds and intact distal pulses.   No murmur heard. Pulmonary/Chest: Effort normal and breath sounds normal. No respiratory distress. He has no wheezes.  Musculoskeletal: Normal range of motion. He exhibits no edema.  Neurological: He is alert and oriented to person, place, and time. Coordination normal.  Skin: Skin is warm and dry. No rash noted. He is not diaphoretic.  Psychiatric: He has a normal mood and affect. His behavior is normal.  Nursing note and vitals reviewed.     Assessment & Plan:   Problem List Items Addressed This Visit      Cardiovascular and Mediastinum   Essential hypertension (Chronic)     Endocrine   Uncontrolled diabetes mellitus ([place]) - Primary   Relevant Medications   Dulaglutide (TRULICITY) 1.5 IN/8.6VE SOPN   Dulaglutide (TRULICITY) 7.20 NO/7.0JG SOPN     Genitourinary   Acute kidney injury superimposed on chronic kidney disease ([place])     Had patient stop Januvia and start Trulicity and give samples for Trulicity. Continue follow-up with nephrology. Once renal function stabilizes we will likely need to restart an angiotensin receptor blocker or an ACE inhibitor for renal protection from the diabetes. At the current levels of the stabilizes where it is at wee may restart metformin in the future.   Follow up plan: Return in about 2 months (around 05/27/2016), or if symptoms worsen or fail to improve, for referral to tammy and see me in 2 months.  Caryl Pina, MD [facility] 03/26/2016, 10:24 AM

## 2016-03-26 NOTE — Patient Instructions (Signed)
Take trulicity 6.14 for 2 weeks once weekly, then take 1.5 mg weekly  Stop [place]  Keep glipizide for now

## 2016-04-01 DIAGNOSIS — E1142 Type 2 diabetes mellitus with diabetic polyneuropathy: Secondary | ICD-10-CM | POA: Diagnosis not present

## 2016-04-23 ENCOUNTER — Ambulatory Visit (INDEPENDENT_AMBULATORY_CARE_PROVIDER_SITE_OTHER): Payer: Medicare Other | Admitting: Pharmacist

## 2016-04-23 ENCOUNTER — Encounter: Payer: Self-pay | Admitting: Pharmacist

## 2016-04-23 VITALS — BP 126/74 | HR 82 | Ht 65.0 in | Wt 201.0 lb

## 2016-04-23 DIAGNOSIS — N183 Chronic kidney disease, stage 3 unspecified: Secondary | ICD-10-CM

## 2016-04-23 DIAGNOSIS — E1122 Type 2 diabetes mellitus with diabetic chronic kidney disease: Secondary | ICD-10-CM

## 2016-04-23 DIAGNOSIS — E1165 Type 2 diabetes mellitus with hyperglycemia: Secondary | ICD-10-CM | POA: Diagnosis not present

## 2016-04-23 DIAGNOSIS — IMO0002 Reserved for concepts with insufficient information to code with codable children: Secondary | ICD-10-CM

## 2016-04-23 MED ORDER — PEN NEEDLES 32G X 4 MM MISC: 1.0000 | Freq: Every day | 1 refills | Status: DC

## 2016-04-23 MED ORDER — INSULIN DEGLUDEC-LIRAGLUTIDE 100-3.6 UNIT-MG/ML ~~LOC~~ SOPN: 16.0000 [IU] | PEN_INJECTOR | Freq: Every day | SUBCUTANEOUS | 1 refills | Status: DC

## 2016-04-23 NOTE — Progress Notes (Signed)
Patient ID: STEPHENS SHREVE, male   DOB: June 19, 1960, 56 y.o.   MRN: [id number]   Subjective:    David Zamora is a 56 y.o. male who presents for an initial  evaluation of Type 2 diabetes mellitus.  Current symptoms/problems include hyperglycemia and weight loss and have been improving. Symptoms have been present for 3 months.  David Zamora has a caregiver present with him to day.  David Zamora is mentally handicap and lives at [facility] for the last 4 years.  His caregiver is with him from Monday morning to Friday morning.  She does not prepare foods but she portions out his meal. Mostly David Zamora's meal are prepared for him but he does choose his own snacks and reports that last week we ate at his church and had 3 plates of spaghetti.  He also drinks regular soda from time to time  Known diabetic complications: nephropathy Cardiovascular risk factors: advanced age (older than 35 for men, 61 for women), diabetes mellitus, male gender, obesity (BMI >= 30 kg/m2) and sedentary lifestyle Current diabetic medications include glipizide 2.5mg  and 10mg  qd; trulicity 1.5mg  weekly .   Eye exam current (within one year): unknown Weight trend: decreasing steadily Prior visit with CDE: no Current diet: in general, an "unhealthy" diet Current exercise: none Medication Compliance?  Yes  Current monitoring regimen: home blood tests - 2 times daily Home blood sugar records: fasting range: 227-464 and postprandial range: 229-558 (and too high to register) Any episodes of hypoglycemia? no  Is He on ACE inhibitor or angiotensin II receptor blocker?  No, not currently due to recent acute renal insufficiancy    Objective:    BP 126/74   Pulse 82   Ht 5\' 5"  (1.651 m)   Wt 201 lb (91.2 kg)   BMI 33.45 kg/m   A1c = 9.0% (12/24/2015)  Lab Review Glucose (mg/dL)  Date Value  02/20/2013 418 (H)   Glucose, Bld (mg/dL)  Date Value  12/27/2015 207 (H)  12/26/2015 480 (H)  12/26/2015 265 (H)   CO2  (mmol/L)  Date Value  12/27/2015 30  12/26/2015 25  12/25/2015 22   Co2 (mmol/L)  Date Value  02/20/2013 28   BUN (mg/dL)  Date Value  12/27/2015 26 (H)  12/26/2015 36 (H)  12/25/2015 41 (H)  02/20/2013 27 (H)   Creatinine (mg/dL)  Date Value  02/20/2013 1.60 (H)   Creatinine, Ser (mg/dL)  Date Value  12/27/2015 1.55 (H)  12/26/2015 1.79 (H)  12/25/2015 2.02 (H)   Assessment:    Diabetes Mellitus type II, under inadequate control.    Plan:    1.  Rx changes: switch from Trulicity to [employer] - start with 16 units once daily .  Increase by 2 units each Friday until FBG is less than 150.  2.  Education: Reviewed 'ABCs' of diabetes management (respective goals in parentheses):  A1C (<7), blood pressure (<130/80), and cholesterol (LDL <100). 3. Discussed pathophysiology of DM; difference between type 1 and type 2 DM. 4. CHO counting diet discussed.  Reviewed CHO amount in various foods and how to read nutrition labels.  Discussed recommended serving sizes.  5.  Recommend check BG 2  times a day 6.  Recommended increase physical activity - goal is 150 minutes per week 7. Follow up: 4 weeks  with PCP - follow up with me in 2 months.

## 2016-04-23 NOTE — Patient Instructions (Signed)
Try to substitute lower sugar snacks - sugar free jello or pudding.  Natural fruit popcicles or sugar free popcicles / fudgesicles.  No regular soda or sweet tea.  We have sent in prescription for Xultrophy - start with 16 units injected under skin once a day.  Every Friday if blood glucose in the morning has been over 150 for the week then increase to by 2 units.

## 2016-04-25 DIAGNOSIS — D509 Iron deficiency anemia, unspecified: Secondary | ICD-10-CM | POA: Diagnosis not present

## 2016-04-25 DIAGNOSIS — E559 Vitamin D deficiency, unspecified: Secondary | ICD-10-CM | POA: Diagnosis not present

## 2016-04-25 DIAGNOSIS — N183 Chronic kidney disease, stage 3 (moderate): Secondary | ICD-10-CM | POA: Diagnosis not present

## 2016-04-25 DIAGNOSIS — R809 Proteinuria, unspecified: Secondary | ICD-10-CM | POA: Diagnosis not present

## 2016-04-25 DIAGNOSIS — I10 Essential (primary) hypertension: Secondary | ICD-10-CM | POA: Diagnosis not present

## 2016-04-25 DIAGNOSIS — Z79899 Other long term (current) drug therapy: Secondary | ICD-10-CM | POA: Diagnosis not present

## 2016-04-29 DIAGNOSIS — L851 Acquired keratosis [keratoderma] palmaris et plantaris: Secondary | ICD-10-CM | POA: Diagnosis not present

## 2016-04-29 DIAGNOSIS — B351 Tinea unguium: Secondary | ICD-10-CM | POA: Diagnosis not present

## 2016-04-29 DIAGNOSIS — E1142 Type 2 diabetes mellitus with diabetic polyneuropathy: Secondary | ICD-10-CM | POA: Diagnosis not present

## 2016-04-29 DIAGNOSIS — L853 Xerosis cutis: Secondary | ICD-10-CM | POA: Diagnosis not present

## 2016-05-02 ENCOUNTER — Other Ambulatory Visit: Payer: Medicare Other

## 2016-05-02 DIAGNOSIS — Z79899 Other long term (current) drug therapy: Secondary | ICD-10-CM | POA: Diagnosis not present

## 2016-05-02 DIAGNOSIS — N183 Chronic kidney disease, stage 3 (moderate): Secondary | ICD-10-CM | POA: Diagnosis not present

## 2016-05-02 DIAGNOSIS — R809 Proteinuria, unspecified: Secondary | ICD-10-CM | POA: Diagnosis not present

## 2016-05-02 DIAGNOSIS — E559 Vitamin D deficiency, unspecified: Secondary | ICD-10-CM | POA: Diagnosis not present

## 2016-05-02 DIAGNOSIS — D509 Iron deficiency anemia, unspecified: Secondary | ICD-10-CM | POA: Diagnosis not present

## 2016-05-02 DIAGNOSIS — I10 Essential (primary) hypertension: Secondary | ICD-10-CM | POA: Diagnosis not present

## 2016-05-21 ENCOUNTER — Encounter: Payer: Self-pay | Admitting: Family Medicine

## 2016-05-21 ENCOUNTER — Ambulatory Visit (INDEPENDENT_AMBULATORY_CARE_PROVIDER_SITE_OTHER): Payer: Medicare Other | Admitting: Family Medicine

## 2016-05-21 VITALS — BP 123/85 | HR 74 | Temp 98.1°F | Ht 65.0 in | Wt 203.4 lb

## 2016-05-21 DIAGNOSIS — E1122 Type 2 diabetes mellitus with diabetic chronic kidney disease: Secondary | ICD-10-CM

## 2016-05-21 DIAGNOSIS — Z114 Encounter for screening for human immunodeficiency virus [HIV]: Secondary | ICD-10-CM | POA: Diagnosis not present

## 2016-05-21 DIAGNOSIS — E1165 Type 2 diabetes mellitus with hyperglycemia: Secondary | ICD-10-CM

## 2016-05-21 DIAGNOSIS — N183 Chronic kidney disease, stage 3 unspecified: Secondary | ICD-10-CM

## 2016-05-21 DIAGNOSIS — IMO0002 Reserved for concepts with insufficient information to code with codable children: Secondary | ICD-10-CM

## 2016-05-21 LAB — BAYER DCA HB A1C WAIVED: HB A1C: 12.5 % — AB (ref <7.0)

## 2016-05-21 NOTE — Progress Notes (Signed)
BP 123/85   Pulse 74   Temp 98.1 F (36.7 C) (Oral)   Ht '5\' 5"'  (1.651 m)   Wt 203 lb 6.4 oz (92.3 kg)   BMI 33.85 kg/m    Subjective:    Patient ID: David Zamora, male    DOB: 1960/07/18, 56 y.o.   MRN: [id number]  HPI: David Zamora is a 56 y.o. male presenting on 05/21/2016 for Diabetes (two month recheck)   HPI Diabetes type 2 Patient is coming for type 2 diabetes recheck. He was started on Xultropy and has been increasing not on a week to week basis. Currently he is getting 20 mL and they're working her way up to get to 27. They saw Cherre Robins PhD and that was what she instructed him to go ahead and do. His blood sugars are still running in the 200s to 300s in the a.m. and between 200 and 500 in the p.m. He has been fighting this issue for some time and also has known renal disease and is seen a nephrologist who is monitoring the situation closely. He knows that he needs something further to get this under control and we will discuss some options with his nephrologist at this point. He denies any new issues with his feet. He will have an ophthalmologist visit this year but has not had one yet.  Relevant past medical, surgical, family and social history reviewed and updated as indicated. Interim medical history since our last visit reviewed. Allergies and medications reviewed and updated.  Review of Systems  Constitutional: Negative for chills and fever.  Respiratory: Negative for shortness of breath and wheezing.   Cardiovascular: Negative for chest pain and leg swelling.  Endocrine: Negative for cold intolerance, heat intolerance, polydipsia and polyuria.  Musculoskeletal: Negative for back pain and gait problem.  Skin: Negative for rash.  Neurological: Negative for dizziness, weakness, numbness and headaches.  All other systems reviewed and are negative.   Per HPI unless specifically indicated above     Objective:    BP 123/85   Pulse 74   Temp 98.1 F (36.7 C)  (Oral)   Ht '5\' 5"'  (1.651 m)   Wt 203 lb 6.4 oz (92.3 kg)   BMI 33.85 kg/m   Wt Readings from Last 3 Encounters:  05/21/16 203 lb 6.4 oz (92.3 kg)  04/23/16 201 lb (91.2 kg)  03/26/16 211 lb 8 oz (95.9 kg)    Physical Exam  Constitutional: He is oriented to person, place, and time. He appears well-developed and well-nourished. No distress.  Eyes: Conjunctivae are normal. Right eye exhibits no discharge. Left eye exhibits no discharge. No scleral icterus.  Neck: Neck supple. No thyromegaly present.  Cardiovascular: Normal rate, regular rhythm, normal heart sounds and intact distal pulses.   No murmur heard. Pulmonary/Chest: Effort normal and breath sounds normal. No respiratory distress. He has no wheezes. He has no rales.  Musculoskeletal: Normal range of motion. He exhibits no edema.  Lymphadenopathy:    He has no cervical adenopathy.  Neurological: He is alert and oriented to person, place, and time. Coordination normal.  Skin: Skin is warm and dry. No rash noted. He is not diaphoretic.  Psychiatric: He has a normal mood and affect. His behavior is normal.  Nursing note and vitals reviewed.  Diabetic Foot Exam - Simple   Simple Foot Form Diabetic Foot exam was performed with the following findings:  Yes 05/21/2016  9:50 AM  Visual Inspection No deformities, no ulcerations,  no other skin breakdown bilaterally:  Yes Sensation Testing Intact to touch and monofilament testing bilaterally:  Yes Pulse Check Posterior Tibialis and Dorsalis pulse intact bilaterally:  Yes Comments Patient does have some athlete's foot but is currently on treatment for it from his podiatrist. No open cracks are noted.        Assessment & Plan:   Problem List Items Addressed This Visit      Endocrine   Uncontrolled diabetes mellitus ([place]) - Primary   Relevant Orders   CMP14+EGFR (Completed)   Bayer DCA Hb A1c Waived (Completed)    Other Visit Diagnoses    Screening for HIV without presence of  risk factors       Relevant Orders   HIV antibody (Completed)      Spoke with nephrologist and he okayed that we go ahead and start metformin 500 twice a day but just not maxing out the metformin. He recommended no more than 1000 mg daily.  Follow up plan: Return in about 2 months (around 07/19/2016), or if symptoms worsen or fail to improve, for Recheck in one month with Tammy in 2 months with me..  Counseling provided for all of the vaccine components Orders Placed This Encounter  Procedures  . CMP14+EGFR  . Bayer DCA Hb A1c Waived  . HIV antibody    Caryl Pina, MD [facility] 05/21/2016, 9:24 AM

## 2016-05-22 ENCOUNTER — Other Ambulatory Visit: Payer: Self-pay | Admitting: Family Medicine

## 2016-05-22 LAB — CMP14+EGFR
ALBUMIN: 3.8 g/dL (ref 3.5–5.5)
ALK PHOS: 83 IU/L (ref 39–117)
ALT: 11 IU/L (ref 0–44)
AST: 11 IU/L (ref 0–40)
Albumin/Globulin Ratio: 1.7 (ref 1.2–2.2)
BUN / CREAT RATIO: 16 (ref 9–20)
BUN: 21 mg/dL (ref 6–24)
CO2: 28 mmol/L (ref 18–29)
CREATININE: 1.35 mg/dL — AB (ref 0.76–1.27)
Calcium: 10.4 mg/dL — ABNORMAL HIGH (ref 8.7–10.2)
Chloride: 98 mmol/L (ref 96–106)
GFR calc non Af Amer: 58 mL/min/{1.73_m2} — ABNORMAL LOW (ref >59)
GFR, EST AFRICAN AMERICAN: 67 mL/min/{1.73_m2} (ref >59)
GLOBULIN, TOTAL: 2.3 g/dL (ref 1.5–4.5)
Glucose: 325 mg/dL — ABNORMAL HIGH (ref 65–99)
Potassium: 4.8 mmol/L (ref 3.5–5.2)
SODIUM: 139 mmol/L (ref 134–144)
TOTAL PROTEIN: 6.1 g/dL (ref 6.0–8.5)

## 2016-05-22 LAB — HIV ANTIBODY (ROUTINE TESTING W REFLEX): HIV Screen 4th Generation wRfx: NONREACTIVE

## 2016-05-22 MED ORDER — METFORMIN HCL 500 MG PO TABS: 500.0000 mg | ORAL_TABLET | Freq: Two times a day (BID) | ORAL | 3 refills | Status: DC

## 2016-05-22 NOTE — Progress Notes (Signed)
lmtcb

## 2016-05-27 ENCOUNTER — Other Ambulatory Visit: Payer: Self-pay | Admitting: Family Medicine

## 2016-05-28 ENCOUNTER — Other Ambulatory Visit: Payer: Self-pay | Admitting: Family Medicine

## 2016-06-03 ENCOUNTER — Other Ambulatory Visit: Payer: Self-pay

## 2016-06-03 ENCOUNTER — Other Ambulatory Visit: Payer: Self-pay | Admitting: Family Medicine

## 2016-06-03 DIAGNOSIS — N183 Chronic kidney disease, stage 3 unspecified: Secondary | ICD-10-CM

## 2016-06-03 DIAGNOSIS — IMO0002 Reserved for concepts with insufficient information to code with codable children: Secondary | ICD-10-CM

## 2016-06-03 DIAGNOSIS — F70 Mild intellectual disabilities: Secondary | ICD-10-CM | POA: Diagnosis not present

## 2016-06-03 DIAGNOSIS — E1122 Type 2 diabetes mellitus with diabetic chronic kidney disease: Secondary | ICD-10-CM

## 2016-06-03 DIAGNOSIS — E1165 Type 2 diabetes mellitus with hyperglycemia: Principal | ICD-10-CM

## 2016-06-03 NOTE — Telephone Encounter (Signed)
Prescription was sent over for blood sugar meter and testing strips for patient to test blood sugar three times daily, sent to [place]

## 2016-06-18 ENCOUNTER — Other Ambulatory Visit: Payer: Self-pay

## 2016-06-18 MED ORDER — BLOOD GLUCOSE MONITOR KIT: PACK | 0 refills | Status: AC

## 2016-06-20 ENCOUNTER — Telehealth: Payer: Self-pay | Admitting: Pharmacist

## 2016-06-20 ENCOUNTER — Other Ambulatory Visit: Payer: Self-pay | Admitting: Pharmacist

## 2016-06-20 MED ORDER — INSULIN DEGLUDEC-LIRAGLUTIDE 100-3.6 UNIT-MG/ML ~~LOC~~ SOPN: 25.0000 [IU] | PEN_INJECTOR | Freq: Every day | SUBCUTANEOUS | 1 refills | Status: DC

## 2016-06-20 NOTE — Telephone Encounter (Signed)
Called to follow up on BG.  Per caregiver BG is still not at goal but improved.  BG ranges form 140's to 300.  Recommended increase Xultophy to 25 units qd. Updated Rx sent to [facility].  Caregiver also asked about glucomter Rx that she faxed to [facility] - she has not received yet.  Called [facility] - they are sending out glucometer today.

## 2016-07-22 ENCOUNTER — Encounter: Payer: Self-pay | Admitting: Pharmacist

## 2016-07-22 ENCOUNTER — Ambulatory Visit (INDEPENDENT_AMBULATORY_CARE_PROVIDER_SITE_OTHER): Payer: Medicare Other | Admitting: Pharmacist

## 2016-07-22 VITALS — BP 128/82 | HR 88 | Ht 65.0 in | Wt 208.0 lb

## 2016-07-22 DIAGNOSIS — Z23 Encounter for immunization: Secondary | ICD-10-CM

## 2016-07-22 DIAGNOSIS — E1165 Type 2 diabetes mellitus with hyperglycemia: Secondary | ICD-10-CM | POA: Diagnosis not present

## 2016-07-22 DIAGNOSIS — N183 Chronic kidney disease, stage 3 unspecified: Secondary | ICD-10-CM

## 2016-07-22 DIAGNOSIS — Z0000 Encounter for general adult medical examination without abnormal findings: Secondary | ICD-10-CM | POA: Diagnosis not present

## 2016-07-22 DIAGNOSIS — IMO0002 Reserved for concepts with insufficient information to code with codable children: Secondary | ICD-10-CM

## 2016-07-22 DIAGNOSIS — E1122 Type 2 diabetes mellitus with diabetic chronic kidney disease: Secondary | ICD-10-CM

## 2016-07-22 MED ORDER — INSULIN DEGLUDEC-LIRAGLUTIDE 100-3.6 UNIT-MG/ML ~~LOC~~ SOPN: 30.0000 [IU] | PEN_INJECTOR | Freq: Every day | SUBCUTANEOUS | 1 refills | Status: DC

## 2016-07-22 NOTE — Patient Instructions (Addendum)
  Mr. David Zamora , Thank you for taking time to come for your Medicare Wellness Visit. I appreciate your ongoing commitment to your health goals. Please review the following plan we discussed and let me know if I can assist you in the future.   These are the goals we discussed:  Keep exercising - goal is 150 minutes per week.   Increase Xultophy to 30 units once a day  Have small snack each night at bedtime (3 to 4 peanut butter crackers) to prevent low blood glucose during the night / early morning.   Continuous Glucose monitor was placed today to try to get better understanding of blood glucose trends.   CGM was placed on back of upper left arm.  You will wear at least 5 days and up to 14 days. You can engage in regular activities with special care when showering, changing clothes and swimming (only up to 3 feet and for 30 minutes at a time)   This is a list of the screening recommended for you and due dates:  Health Maintenance  Topic Date Due  . Eye exam for diabetics  08/2016  . Pneumococcal vaccine (1) 09/24/2016*  . Flu Shot  11/12/2016  . Hemoglobin A1C  11/18/2016  . Urine Protein Check  12/24/2016  . Complete foot exam   05/21/2017  . Colon Cancer Screening  08/16/2019  . Tetanus Vaccine  04/12/2021  .  Hepatitis C: One time screening is recommended by Center for Disease Control  (CDC) for  adults born from [birth year] through 1967.   Completed  . HIV Screening  Completed  *Topic was postponed. The date shown is not the original due date.

## 2016-07-22 NOTE — Progress Notes (Signed)
Patient ID: David Zamora, male   DOB: [birth year], 58 y.o.   MRN: [id number]     Subjective:   David Zamora is a 56 y.o. male who presents for an Initial Medicare Annual Wellness Visit.  David Zamora is a single male.  No children.  He has mental retardation and lives at [facility].  He has a Actuary with him most days and also attends a day program for activities like exercise, arts and crafts during the week.   He has no health concerns but his aide is concerned that his BG is not at goal.   Reviewed BG records from group home.  2 episodes of hypoglycemia - 37 and 57.  Both occurred when patient had not eaten snack before bedtime.  AM BG ranges from 97 to 279 PM BG ranges from 99 to 436 10 day BG average = 197   Current Medications (verified) Outpatient Encounter Prescriptions as of 07/22/2016  Medication Sig  . blood glucose meter kit and supplies KIT Dispense based on patient and insurance preference. Use up to four times daily as directed. (FOR ICD-9 250.00, 250.01).  [name] Kitchen buPROPion (WELLBUTRIN XL) 150 MG 24 hr tablet Take 150 mg by mouth every morning.   . busPIRone (BUSPAR) 10 MG tablet Take 10 mg by mouth 2 (two) times daily.  . Carboxymeth-Glycerin-Polysorb (REFRESH OPTIVE ADVANCED) 0.5-1-0.5 % SOLN Apply 1 drop to eye 4 (four) times daily.  . divalproex (DEPAKOTE ER) 250 MG 24 hr tablet Take 250-1,000 mg by mouth 2 (two) times daily. 1 tablet in the morning and 4 tablets at bedtime  . furosemide (LASIX) 20 MG tablet TAKE 1 TABLET BY MOUTH ONCE DAILY AS NEEDED FOR WEIGHT GAIN GREATER THAN 3 LBS  . glipiZIDE (GLUCOTROL XL) 2.5 MG 24 hr tablet TAKE 1 TABLET BY MOUTH DAILY WITH BREAKFAST WITH 10 MG TABLET TO =12.5MG DAILY.  [name] Kitchen GLIPIZIDE XL 10 MG 24 hr tablet TAKE (1) TABLET BY MOUTH ONCE DAILY.  [name] Kitchen Insulin Degludec-Liraglutide (XULTOPHY) 100-3.6 UNIT-MG/ML SOPN Inject 30 Units into the skin daily.  . Insulin Pen Needle (PEN NEEDLES) 32G X 4 MM MISC 1 each by Does not apply route  daily. Use to injection Xultophy daily  . metFORMIN (GLUCOPHAGE) 500 MG tablet Take 1 tablet (500 mg total) by mouth 2 (two) times daily with a meal.  . omeprazole (PRILOSEC) 20 MG capsule TAKE 1 CAPSULE BY MOUTH ONCE DAILY.  [name] Kitchen PARoxetine (PAXIL) 20 MG tablet Take 20 mg by mouth every morning.  . QC LO-DOSE ASPIRIN 81 MG EC tablet TAKE (1) TABLET BY MOUTH ONCE DAILY.  [name] Kitchen QUEtiapine (SEROQUEL XR) 300 MG 24 hr tablet Take 300 mg by mouth daily. Taken daily at 4pm  . simvastatin (ZOCOR) 40 MG tablet TAKE (1) TABLET BY MOUTH ONCE DAILY.  . tamsulosin (FLOMAX) 0.4 MG CAPS capsule TAKE 1 CAPSULE BY MOUTH ONCE A DAY.  [name] Kitchen triamcinolone ointment (KENALOG) 0.5 % APPLY TOPICALLY TWICE DAILY.  . [DISCONTINUED] Insulin Degludec-Liraglutide (XULTOPHY) 100-3.6 UNIT-MG/ML SOPN Inject 25 Units into the skin daily.  . [DISCONTINUED] JANUVIA 100 MG tablet TAKE (1) TABLET BY MOUTH ONCE DAILY. (Patient not taking: Reported on 07/22/2016)   No facility-administered encounter medications on file as of 07/22/2016.     Allergies (verified) Patient has no known allergies.   History: Past Medical History:  Diagnosis Date  . Depression   . Diabetes mellitus   . GERD (gastroesophageal reflux disease)   . Hypertension   . Mental retardation  Past Surgical History:  Procedure Laterality Date  . CYST REMOVAL TRUNK    . FINGER FRACTURE SURGERY  02/22/2013   Family History  Problem Relation Age of Onset  . COPD Mother   . Diabetes Mother   . Heart disease Mother   . Early death Father   . Heart attack Father   . Heart disease Father    Social History   Occupational History  . Not on file.   Social History Main Topics  . Smoking status: Current Some Day Smoker    Packs/day: 0.25    Types: Cigarettes    Last attempt to quit: 2012  . Smokeless tobacco: Never Used  . Alcohol use No     Comment: Denies  . Drug use: No     Comment: Denies   . Sexual activity: Not on file    Do you feel safe at home?   Yes  Dietary issues and exercise activities discussed: Current Exercise Habits: Structured exercise class, Type of exercise: stretching, Time (Minutes): 20, Frequency (Times/Week): 5, Weekly Exercise (Minutes/Week): 100, Intensity: Mild  Current Dietary habits:  Patient's meals are prepared by residence.  He does have his own snacks and he sneaks food at night.  The Residence home that he lives in has been keeping food locked up to prevent him from eating at night.  Drinks diet sodas.    Cardiac Risk Factors include: advanced age (>35mn, >>77women);diabetes mellitus;family history of premature cardiovascular disease;male gender;smoking/ tobacco exposure;obesity (BMI >30kg/m2)  Objective:    Today's Vitals   07/22/16 0958  BP: 128/82  Pulse: 88  Weight: 208 lb (94.3 kg)  Height: 5' 5" (1.651 m)   Body mass index is 34.61 kg/m.   Activities of Daily Living In your present state of health, do you have any difficulty performing the following activities: 07/22/2016 12/24/2015  Hearing? N N  Vision? N N  Difficulty concentrating or making decisions? N N  Walking or climbing stairs? N N  Dressing or bathing? N N  Doing errands, shopping? YTempie Donning Preparing Food and eating ? Y -  Using the Toilet? N -  In the past six months, have you accidently leaked urine? N -  Do you have problems with loss of bowel control? N -  Managing your Medications? Y -  Managing your Finances? Y -  Housekeeping or managing your Housekeeping? Y -  Some recent data might be hidden     Depression Screen PHQ 2/9 Scores 07/22/2016 05/21/2016 03/26/2016  PHQ - 2 Score 2 0 0  PHQ- 9 Score 3 - -     Fall Risk Fall Risk  07/22/2016 05/21/2016  Falls in the past year? No No    Cognitive Function: MMSE - Mini Mental State Exam 07/22/2016  Not completed: Unable to complete    Immunizations and Health Maintenance Immunization History  Administered Date(s) Administered  . Influenza,inj,Quad PF,36+ Mos  03/26/2016  . Pneumococcal Conjugate-13 07/22/2016  . Tdap 04/13/2011   Health Maintenance Due  Topic Date Due  . OPHTHALMOLOGY EXAM  08/19/1968    Patient Care Team: JWorthy Rancher MD as PCP - General (Family Medicine) MAlvy Bimler MD as Consulting Physician (Neurology) YAdelina MingsHMargret Chance MD as Consulting Physician (Optometry) BFran Lowes MD as Consulting Physician (Nephrology)  Indicate any recent Medical Services you may have received from other than [facility] providers in the past year (date may be approximate).    Assessment:    Annual Wellness  Visit  Uncontrolled type 2 DM, requiring insulin therapy   Screening Tests Health Maintenance  Topic Date Due  . OPHTHALMOLOGY EXAM  08/19/1968  . PNEUMOCOCCAL POLYSACCHARIDE VACCINE (1) 09/24/2016 (Originally 08/19/1960)  . INFLUENZA VACCINE  11/12/2016  . HEMOGLOBIN A1C  11/18/2016  . URINE MICROALBUMIN  12/24/2016  . FOOT EXAM  05/21/2017  . COLONOSCOPY  08/16/2019  . TETANUS/TDAP  04/12/2021  . Hepatitis C Screening  Completed  . HIV Screening  Completed        Plan:   During the course of the visit David Zamora was educated and counseled about the following appropriate screening and preventive services:   Vaccines to include Pneumoccal, Influenza,  Td, and Shingles - received prevnar 52 in office today  Colorectal cancer screening = currently UTD  Cardiovascular disease screening - EKG is UTD  Diabetes - increase Xultophy to 30 units daily  CGM placed today - patient will RTC in 2 weeks to download results and review  Glaucoma screening / Diabetic Eye Exam - UTD - need to request records from Dr LE  Nutrition counseling - reminded him to eat snack at night.  Avoid sugar containing foods.  Smoking cessation counseling - discussed D/C smoking - patient will try to avoid smoking areas of residence.  Advanced Directives - not applicable  Physical Activity - continue to do exercise at day program. Also  suggested walking around residence.      Patient Instructions (the written plan) were given to the patient.   Cherre Robins, PharmD   07/22/2016

## 2016-07-23 ENCOUNTER — Ambulatory Visit (INDEPENDENT_AMBULATORY_CARE_PROVIDER_SITE_OTHER): Payer: Medicare Other | Admitting: Family Medicine

## 2016-07-23 ENCOUNTER — Encounter: Payer: Self-pay | Admitting: Family Medicine

## 2016-07-23 ENCOUNTER — Telehealth: Payer: Self-pay | Admitting: Family Medicine

## 2016-07-23 VITALS — BP 118/84 | HR 77 | Temp 99.2°F | Ht 65.0 in | Wt 208.0 lb

## 2016-07-23 DIAGNOSIS — N179 Acute kidney failure, unspecified: Secondary | ICD-10-CM

## 2016-07-23 DIAGNOSIS — I129 Hypertensive chronic kidney disease with stage 1 through stage 4 chronic kidney disease, or unspecified chronic kidney disease: Secondary | ICD-10-CM | POA: Diagnosis not present

## 2016-07-23 DIAGNOSIS — E1165 Type 2 diabetes mellitus with hyperglycemia: Principal | ICD-10-CM

## 2016-07-23 DIAGNOSIS — N183 Chronic kidney disease, stage 3 unspecified: Secondary | ICD-10-CM

## 2016-07-23 DIAGNOSIS — I10 Essential (primary) hypertension: Secondary | ICD-10-CM

## 2016-07-23 DIAGNOSIS — E1122 Type 2 diabetes mellitus with diabetic chronic kidney disease: Secondary | ICD-10-CM

## 2016-07-23 DIAGNOSIS — IMO0002 Reserved for concepts with insufficient information to code with codable children: Secondary | ICD-10-CM

## 2016-07-23 DIAGNOSIS — N189 Chronic kidney disease, unspecified: Secondary | ICD-10-CM | POA: Diagnosis not present

## 2016-07-23 LAB — BAYER DCA HB A1C WAIVED: HB A1C: 9.3 % — AB (ref <7.0)

## 2016-07-23 LAB — CMP14+EGFR
ALK PHOS: 71 IU/L (ref 39–117)
ALT: 12 IU/L (ref 0–44)
AST: 17 IU/L (ref 0–40)
Albumin/Globulin Ratio: 1.6 (ref 1.2–2.2)
Albumin: 3.7 g/dL (ref 3.5–5.5)
BILIRUBIN TOTAL: 0.3 mg/dL (ref 0.0–1.2)
BUN/Creatinine Ratio: 21 — ABNORMAL HIGH (ref 9–20)
BUN: 27 mg/dL — AB (ref 6–24)
CHLORIDE: 99 mmol/L (ref 96–106)
CO2: 25 mmol/L (ref 18–29)
CREATININE: 1.3 mg/dL — AB (ref 0.76–1.27)
Calcium: 9.6 mg/dL (ref 8.7–10.2)
GFR calc Af Amer: 70 mL/min/{1.73_m2} (ref >59)
GFR calc non Af Amer: 61 mL/min/{1.73_m2} (ref >59)
GLUCOSE: 154 mg/dL — AB (ref 65–99)
Globulin, Total: 2.3 g/dL (ref 1.5–4.5)
Potassium: 4.3 mmol/L (ref 3.5–5.2)
Sodium: 138 mmol/L (ref 134–144)
Total Protein: 6 g/dL (ref 6.0–8.5)

## 2016-07-23 NOTE — Progress Notes (Signed)
BP 118/84   Pulse 77   Temp 99.2 F (37.3 C) (Oral)   Ht _0  (1.651 m)   Wt 208 lb (94.3 kg)   BMI 34.61 kg/m    Subjective:    Patient ID: David Zamora, male    DOB: 1960/12/10, 56 y.o.   MRN: [id number]  HPI: David Zamora is a 56 y.o. male presenting on 07/23/2016 for Diabetes (2 month followup) and Hypertension   HPI Type 2 diabetes recheck Patient is coming in for a type 2 diabetes recheck. He is currently on glipizide 12.5 and metformin 500 twice a day and Xultophy 30. Patient showed some acute kidney injury but has been improving over the last blood draws and he has some mild chronic kidney injury residual, will recheck this today. He denies any issues with his feet. He is due to see his eye doctor next month and will see them sometime around then. He denies any issues with his vision. The patient is not currently on an ACE inhibitor or an arb but we will get him on one as soon as the kidneys stabilize at the level that they are going to be. His blood sugars but they have brought records for range anywhere from 1062 300s this month. Last month he did have a couple of lows but has not had any this month so far.  Hyperlipidemia Patient is coming in for recheck of his hyperlipidemia. He is currently taking simvastatin. He denies any issues with myalgias or history of liver damage from it. He denies any focal numbness or weakness or chest pain.   Hypertension Patient is coming in for hypertension recheck. His blood pressure is 118/84. He is not currently on any medication for blood pressure. Patient denies headaches, blurred vision, chest pains, shortness of breath, or weakness. Denies any side effects from medication and is content with current medication.   Relevant past medical, surgical, family and social history reviewed and updated as indicated. Interim medical history since our last visit reviewed. Allergies and medications reviewed and updated.  Review of Systems    Constitutional: Negative for chills and fever.  Respiratory: Negative for shortness of breath and wheezing.   Cardiovascular: Negative for chest pain and leg swelling.  Musculoskeletal: Negative for back pain and gait problem.  Skin: Negative for rash.  Neurological: Negative for dizziness, weakness, light-headedness and numbness.  All other systems reviewed and are negative.   Per HPI unless specifically indicated above     Objective:    BP 118/84   Pulse 77   Temp 99.2 F (37.3 C) (Oral)   Ht _1  (1.651 m)   Wt 208 lb (94.3 kg)   BMI 34.61 kg/m   Wt Readings from Last 3 Encounters:  07/23/16 208 lb (94.3 kg)  07/22/16 208 lb (94.3 kg)  05/21/16 203 lb 6.4 oz (92.3 kg)    Physical Exam  Constitutional: He is oriented to person, place, and time. He appears well-developed and well-nourished. No distress.  Eyes: Conjunctivae are normal. No scleral icterus.  Neck: Neck supple. No thyromegaly present.  Cardiovascular: Normal rate, regular rhythm, normal heart sounds and intact distal pulses.   No murmur heard. Pulmonary/Chest: Effort normal and breath sounds normal. No respiratory distress. He has no wheezes. He has no rales.  Musculoskeletal: Normal range of motion. He exhibits no edema.  Lymphadenopathy:    He has no cervical adenopathy.  Neurological: He is alert and oriented to person, place, and time. Coordination  normal.  Skin: Skin is warm and dry. No rash noted. He is not diaphoretic.  Psychiatric: He has a normal mood and affect. His behavior is normal.  Nursing note and vitals reviewed.      Assessment & Plan:   Problem List Items Addressed This Visit      Cardiovascular and Mediastinum   Essential hypertension (Chronic)   Relevant Orders   CMP14+EGFR     Endocrine   Uncontrolled diabetes mellitus ([place]) - Primary   Relevant Orders   CMP14+EGFR   Bayer DCA Hb A1c Waived     Genitourinary   Acute kidney injury superimposed on chronic kidney disease  ([place])   Relevant Orders   CMP14+EGFR       Follow up plan: Return in about 3 months (around 10/22/2016), or if symptoms worsen or fail to improve, for Diabetes and hypertension recheck.  Counseling provided for all of the vaccine components Orders Placed This Encounter  Procedures  . CMP14+EGFR  . Bayer [facility] Hb A1c [place], MD [facility] 07/23/2016, 9:17 AM

## 2016-07-24 ENCOUNTER — Other Ambulatory Visit: Payer: Self-pay | Admitting: *Deleted

## 2016-08-05 ENCOUNTER — Ambulatory Visit (INDEPENDENT_AMBULATORY_CARE_PROVIDER_SITE_OTHER): Payer: Medicare Other | Admitting: Pharmacist

## 2016-08-05 VITALS — BP 132/72 | HR 75 | Ht 65.0 in | Wt 214.0 lb

## 2016-08-05 DIAGNOSIS — E1165 Type 2 diabetes mellitus with hyperglycemia: Secondary | ICD-10-CM

## 2016-08-05 DIAGNOSIS — IMO0002 Reserved for concepts with insufficient information to code with codable children: Secondary | ICD-10-CM

## 2016-08-05 DIAGNOSIS — N182 Chronic kidney disease, stage 2 (mild): Secondary | ICD-10-CM

## 2016-08-05 DIAGNOSIS — E1122 Type 2 diabetes mellitus with diabetic chronic kidney disease: Secondary | ICD-10-CM | POA: Diagnosis not present

## 2016-08-05 DIAGNOSIS — Z794 Long term (current) use of insulin: Secondary | ICD-10-CM

## 2016-08-05 MED ORDER — METFORMIN HCL 1000 MG PO TABS: 1000.0000 mg | ORAL_TABLET | Freq: Two times a day (BID) | ORAL | 3 refills | Status: DC

## 2016-08-05 NOTE — Patient Instructions (Signed)
Increase metformin to 1000mg  twice a day  Stop glipizide 2.5mg    Continue glipizide XR 10mg  take 1 tablet once a day  Continue Xultophy 30 units once a day in the morning.   Monitor for hypoglycemia.  Patient has been taught about signs of hypoglycemia.  He is to tell staff member if he is experiencing these symptoms.  Recommend check blood glucose.  If blood glucose is less than 70 then resident is to receive 15 grams of carbohydrates to increase blood glucose such as 4 ounces of juice or regular soda, 5 pieces of hard candy (not sugar free).  Recheck blood glucose in 15 minutes - if blood glucose if over 70 then follow with a small snack such as 1/2 a peanut butter sandwich.  If blood glucose if still less than 70 then repeat 15 grams of carbohydrates.

## 2016-08-05 NOTE — Progress Notes (Signed)
Patient ID: David Zamora, male   DOB: 11-01-1960, 57 y.o.   MRN: [id number]   Subjective:    David Zamora is a 55 y.o. male who presents for a follow up evaluation of Type 2 diabetes mellitus requiring insulin therapy.  Two weeks ago we place a Continueous Glucose Monitor and David Zamora and his caregiver / CNA are here today to remove CGM, discuss results and make medication changes.   Current diabetic medications include Xultophy 30 units daily in am, metformin 500mg  bid, glipizide XL 2.5mg  qam and glpizide XL 10mg  qam.   CGM Report:  CGM data as collected from July 22, 2016 thru August 05, 2016 Avg daily BG = 146 Estimated A1c time CGM was worn = 6.7% 17 episodes of  Hypoglycemia 32% of time in BG target range of 80 to 140 16% of time below BG target 52% of time above target  Most hypoglycemic events occurred during sleep or between 11am and 1pm (on Sundays) Hyperglycemia occurred mostly after breakfast in am - between 6:30 am and 9am or after evening meal 6:30pm to 8pm   Current monitoring regimen: home blood tests - 2 times daily Home blood sugar records: did not bring in records today since wore CGM    Weight trend: increasing steadily Prior visit with CDE: yes -   Current diet: on average, 3 meals per day, group home tries to provide low CHO meals but patient gets ice cream and other high sugar foods on a regular basis.  He reports that he thinks he slept through lunch on 2 Sundays but patient is a poor historian and I am not sure this is correct though low BG around lunch on Sundays per CGM report would indicate that this might be the case.  Current exercise: some exericse during week at dayprogram.  Medication Compliance?  Yes    Objective:    BP 132/72   Pulse 75   Ht 5\' 5"  (1.651 m)   Wt 214 lb (97.1 kg)   BMI 35.61 kg/m    Last A1c was 9.3% (07/23/2016) A1c = 12.5% (05/21/2016)   Lab Review Glucose (mg/dL)  Date Value  07/23/2016 154 (H)  05/21/2016 325  (H)  02/20/2013 418 (H)   Glucose, Bld (mg/dL)  Date Value  12/27/2015 207 (H)  12/26/2015 480 (H)  12/26/2015 265 (H)   CO2 (mmol/L)  Date Value  07/23/2016 25  05/21/2016 28  12/27/2015 30   Co2 (mmol/L)  Date Value  02/20/2013 28   BUN (mg/dL)  Date Value  07/23/2016 27 (H)  05/21/2016 21  12/27/2015 26 (H)  12/26/2015 36 (H)  12/25/2015 41 (H)  02/20/2013 27 (H)   Creatinine (mg/dL)  Date Value  02/20/2013 1.60 (H)   Creatinine, Ser (mg/dL)  Date Value  07/23/2016 1.30 (H)  05/21/2016 1.35 (H)  12/27/2015 1.55 (H)      Assessment:    Diabetes Mellitus type II, under inadequate control but A1c and CGM indicated improving BG since starting Xultophy in January 2018 Plan:    1.  Rx changes: d/c glipizide 2.5mg .  Increase metformin to 1000mg  bid.    Continue Xultophy 30 units qam and continue glipizide XL 10mg  qam.  2.  Educated about s/s of hypoglycemia.  Also provided residence / group home with plan to assess and treat hypoglycemia if occurs.  3.   Recommend check BG 2  times a day and as needed if patient feels BG is low 4. Follow up:  4 weeks with me and 2 months with PCP

## 2016-08-15 MED ORDER — BLOOD GLUCOSE TEST VI STRP: ORAL_STRIP | 2 refills | Status: DC

## 2016-08-15 NOTE — Addendum Note (Signed)
Addended by: Cherre Robins B on: 08/15/2016 10:57 AM   Modules accepted: Orders

## 2016-08-15 NOTE — Progress Notes (Signed)
Addendum to note from 08/05/16.  Patient's BG is checked tid by staff at [facility].  Recommend they continue to check BG tid and add as needed for s/s of hypoglycemia.

## 2016-09-01 DIAGNOSIS — E559 Vitamin D deficiency, unspecified: Secondary | ICD-10-CM | POA: Diagnosis not present

## 2016-09-01 DIAGNOSIS — R809 Proteinuria, unspecified: Secondary | ICD-10-CM | POA: Diagnosis not present

## 2016-09-01 DIAGNOSIS — I10 Essential (primary) hypertension: Secondary | ICD-10-CM | POA: Diagnosis not present

## 2016-09-01 DIAGNOSIS — D509 Iron deficiency anemia, unspecified: Secondary | ICD-10-CM | POA: Diagnosis not present

## 2016-09-01 DIAGNOSIS — N183 Chronic kidney disease, stage 3 (moderate): Secondary | ICD-10-CM | POA: Diagnosis not present

## 2016-09-01 DIAGNOSIS — Z79899 Other long term (current) drug therapy: Secondary | ICD-10-CM | POA: Diagnosis not present

## 2016-09-02 DIAGNOSIS — F70 Mild intellectual disabilities: Secondary | ICD-10-CM | POA: Diagnosis not present

## 2016-09-04 ENCOUNTER — Other Ambulatory Visit: Payer: Medicare Other

## 2016-09-09 DIAGNOSIS — R809 Proteinuria, unspecified: Secondary | ICD-10-CM | POA: Diagnosis not present

## 2016-09-09 DIAGNOSIS — Z79899 Other long term (current) drug therapy: Secondary | ICD-10-CM | POA: Diagnosis not present

## 2016-09-09 DIAGNOSIS — N182 Chronic kidney disease, stage 2 (mild): Secondary | ICD-10-CM | POA: Diagnosis not present

## 2016-09-09 DIAGNOSIS — N183 Chronic kidney disease, stage 3 (moderate): Secondary | ICD-10-CM | POA: Diagnosis not present

## 2016-09-09 DIAGNOSIS — E875 Hyperkalemia: Secondary | ICD-10-CM | POA: Diagnosis not present

## 2016-09-09 DIAGNOSIS — I10 Essential (primary) hypertension: Secondary | ICD-10-CM | POA: Diagnosis not present

## 2016-09-09 DIAGNOSIS — I509 Heart failure, unspecified: Secondary | ICD-10-CM | POA: Diagnosis not present

## 2016-09-16 ENCOUNTER — Ambulatory Visit (INDEPENDENT_AMBULATORY_CARE_PROVIDER_SITE_OTHER): Payer: Medicare Other | Admitting: Pharmacist

## 2016-09-16 VITALS — BP 138/76 | HR 76 | Ht 65.0 in | Wt 213.0 lb

## 2016-09-16 DIAGNOSIS — E1165 Type 2 diabetes mellitus with hyperglycemia: Secondary | ICD-10-CM

## 2016-09-16 DIAGNOSIS — IMO0002 Reserved for concepts with insufficient information to code with codable children: Secondary | ICD-10-CM

## 2016-09-16 DIAGNOSIS — N183 Chronic kidney disease, stage 3 unspecified: Secondary | ICD-10-CM

## 2016-09-16 DIAGNOSIS — E1122 Type 2 diabetes mellitus with diabetic chronic kidney disease: Secondary | ICD-10-CM | POA: Diagnosis not present

## 2016-09-16 MED ORDER — INSULIN DEGLUDEC-LIRAGLUTIDE 100-3.6 UNIT-MG/ML ~~LOC~~ SOPN: 35.0000 [IU] | PEN_INJECTOR | Freq: Every day | SUBCUTANEOUS | 1 refills | Status: DC

## 2016-09-16 NOTE — Progress Notes (Signed)
Patient ID: David Zamora, male   DOB: 02-03-1961, 56 y.o.   MRN: [id number]   Subjective:    David Zamora is a 56 y.o. male who presents for a follow up evaluation of Type 2 diabetes mellitus requiring insulin therapy.  Mr. Tabet lives in a group home and has nursing assistants who help with monitoring BG and taking medications daily. The assistant with him today repots that she belives David Zamora's BG has improved though he still struggles with following prescriber diet.   Current diabetic medications include Xultophy 30 units daily in am, metformin 100mg  bid and glpizide XL 10mg  qam.   Current monitoring regimen: home blood tests - 2-3 times daily Home blood sugar records reviewed: AM BG ranges from 81 to 297; average = 163 PM BG ranges from 94 - 260; average = 176 Of note - per nursing assistant, she usually checks BG in am between 7 and 7:15am which is about 30 to 45 minutes after breakfast Evening BG is checked at 7p which is 1-2 hours after supper.  Weight trend: stable Current diet: 3 meals per day;  2-3 snacks per day.  Still eating sugar free cookies and thinks that they will not affect BG.  Current exercise: walking and group exercise at day program Medication Compliance?  Yes    Objective:    BP 138/76   Pulse 76   Ht 5\' 5"  (1.651 m)   Wt 213 lb (96.6 kg)   BMI 35.45 kg/m    Last A1c was 9.3% (07/23/2016) A1c = 12.5% (05/21/2016)  Assessment:    Diabetes Mellitus type II, under improving control but not at goal  Plan:    1.  Rx changes: Increase Xultophy 35 units qam   Continue  glipizide XL 10mg  qam and metformin 1000mg  bid take with food 2.  Reviewed CHO in foods - reminded patient that sugar free does not mean CHO free and that he needs to limit sweets even sugar free sweets 3.   Recommend check BG 3  times a day and as needed if patient feels BG is low 4. Follow up: 4 weeks with me and 2 months with PCP

## 2016-09-25 ENCOUNTER — Other Ambulatory Visit: Payer: Medicare Other

## 2016-10-23 ENCOUNTER — Ambulatory Visit: Payer: Medicare Other | Admitting: Family Medicine

## 2016-10-30 ENCOUNTER — Ambulatory Visit (INDEPENDENT_AMBULATORY_CARE_PROVIDER_SITE_OTHER): Payer: Medicare Other | Admitting: Family Medicine

## 2016-10-30 ENCOUNTER — Encounter: Payer: Self-pay | Admitting: Family Medicine

## 2016-10-30 ENCOUNTER — Other Ambulatory Visit: Payer: Self-pay | Admitting: Pharmacist

## 2016-10-30 VITALS — BP 121/85 | HR 85 | Temp 98.8°F | Ht 65.0 in | Wt 210.4 lb

## 2016-10-30 DIAGNOSIS — E1122 Type 2 diabetes mellitus with diabetic chronic kidney disease: Secondary | ICD-10-CM

## 2016-10-30 DIAGNOSIS — I10 Essential (primary) hypertension: Secondary | ICD-10-CM

## 2016-10-30 DIAGNOSIS — IMO0002 Reserved for concepts with insufficient information to code with codable children: Secondary | ICD-10-CM

## 2016-10-30 DIAGNOSIS — N189 Chronic kidney disease, unspecified: Secondary | ICD-10-CM

## 2016-10-30 DIAGNOSIS — N179 Acute kidney failure, unspecified: Secondary | ICD-10-CM | POA: Diagnosis not present

## 2016-10-30 DIAGNOSIS — E1165 Type 2 diabetes mellitus with hyperglycemia: Secondary | ICD-10-CM

## 2016-10-30 DIAGNOSIS — N183 Chronic kidney disease, stage 3 unspecified: Secondary | ICD-10-CM

## 2016-10-30 LAB — BAYER DCA HB A1C WAIVED: HB A1C: 7 % — AB (ref <7.0)

## 2016-10-30 NOTE — Progress Notes (Signed)
BP 121/85   Pulse 85   Temp 98.8 F (37.1 C) (Oral)   Ht 5' 5" (1.651 m)   Wt 210 lb 6 oz (95.4 kg)   BMI 35.01 kg/m    Subjective:    Patient ID: David Zamora, male    DOB: 11/05/1960, 56 y.o.   MRN: [id number]  HPI: David Zamora is a 56 y.o. male presenting on 10/30/2016 for Diabetes (3 mo) and Hypertension   HPI Type 2 diabetes mellitus Patient comes in today for recheck of his diabetes. Patient has been currently taking Glipizide and Xultophy and he says his blood sugars have been running better although he did have one up for 300 last night but typically he says they've been running under 200. Patient is currently on an ACE inhibitor/ARB. Patient has seen an ophthalmologist this year. Patient denies any issues with their feet.   Hypertension Patient is currently on lisinopril, and their blood pressure today is 121/85. Patient denies any lightheadedness or dizziness. Patient denies headaches, blurred vision, chest pains, shortness of breath, or weakness. Denies any side effects from medication and is content with current medication.   Acute on chronic kidney disease Patient is coming in for follow-up on acute on chronic kidney disease. His last creatinine was 1.30 with a GFR of 61 which was improved from previous. We will recheck labs today. This last lab check was 3 months ago. He denies any difficulty urinating  Relevant past medical, surgical, family and social history reviewed and updated as indicated. Interim medical history since our last visit reviewed. Allergies and medications reviewed and updated.  Review of Systems  Constitutional: Negative for chills and fever.  Eyes: Negative for discharge.  Respiratory: Negative for shortness of breath and wheezing.   Cardiovascular: Negative for chest pain and leg swelling.  Musculoskeletal: Negative for back pain and gait problem.  Skin: Negative for rash.  Neurological: Negative for dizziness, weakness, light-headedness,  numbness and headaches.  All other systems reviewed and are negative.   Per HPI unless specifically indicated above     Objective:    BP 121/85   Pulse 85   Temp 98.8 F (37.1 C) (Oral)   Ht 5' 5" (1.651 m)   Wt 210 lb 6 oz (95.4 kg)   BMI 35.01 kg/m   Wt Readings from Last 3 Encounters:  10/30/16 210 lb 6 oz (95.4 kg)  09/16/16 213 lb (96.6 kg)  08/05/16 214 lb (97.1 kg)    Physical Exam  Constitutional: He is oriented to person, place, and time. He appears well-developed and well-nourished. No distress.  Eyes: Conjunctivae are normal. No scleral icterus.  Neck: Neck supple. No thyromegaly present.  Cardiovascular: Normal rate, regular rhythm, normal heart sounds and intact distal pulses.   No murmur heard. Pulmonary/Chest: Effort normal and breath sounds normal. No respiratory distress. He has no wheezes.  Musculoskeletal: Normal range of motion. He exhibits no edema.  Lymphadenopathy:    He has no cervical adenopathy.  Neurological: He is alert and oriented to person, place, and time. Coordination normal.  Skin: Skin is warm and dry. No rash noted. He is not diaphoretic.  Psychiatric: He has a normal mood and affect. His behavior is normal.  Nursing note and vitals reviewed.   Results for orders placed or performed in visit on 08/26/16  HM DIABETES EYE EXAM  Result Value Ref Range   HM Diabetic Eye Exam No Retinopathy No Retinopathy      Assessment &  Plan:   Problem List Items Addressed This Visit      Cardiovascular and Mediastinum   Essential hypertension (Chronic)   Relevant Orders   CMP14+EGFR     Endocrine   Uncontrolled diabetes mellitus ([place]) - Primary   Relevant Medications   glipiZIDE (GLUCOTROL XL) 10 MG 24 hr tablet   Other Relevant Orders   Bayer DCA Hb A1c Waived     Genitourinary   Acute kidney injury superimposed on chronic kidney disease ([place])   Relevant Orders   CMP14+EGFR       Follow up plan: Return in about 3 months (around  01/30/2017), or if symptoms worsen or fail to improve, for Diabetes and hypertension recheck.  Counseling provided for all of the vaccine components Orders Placed This Encounter  Procedures  . Bayer DCA Hb A1c Waived  . [place] Dettinger, MD [facility] 10/30/2016, 3:12 PM

## 2016-10-31 LAB — CMP14+EGFR
ALK PHOS: 67 IU/L (ref 39–117)
ALT: 9 IU/L (ref 0–44)
AST: 16 IU/L (ref 0–40)
Albumin/Globulin Ratio: 1.4 (ref 1.2–2.2)
Albumin: 3.6 g/dL (ref 3.5–5.5)
BUN / CREAT RATIO: 13 (ref 9–20)
BUN: 16 mg/dL (ref 6–24)
Bilirubin Total: <0.2 mg/dL (ref 0.0–1.2)
CALCIUM: 9.1 mg/dL (ref 8.7–10.2)
CO2: 25 mmol/L (ref 20–29)
CREATININE: 1.25 mg/dL (ref 0.76–1.27)
Chloride: 100 mmol/L (ref 96–106)
GFR calc Af Amer: 73 mL/min/{1.73_m2} (ref >59)
GFR, EST NON AFRICAN AMERICAN: 63 mL/min/{1.73_m2} (ref >59)
GLOBULIN, TOTAL: 2.6 g/dL (ref 1.5–4.5)
Glucose: 79 mg/dL (ref 65–99)
Potassium: 4.5 mmol/L (ref 3.5–5.2)
SODIUM: 142 mmol/L (ref 134–144)
Total Protein: 6.2 g/dL (ref 6.0–8.5)

## 2016-11-26 DIAGNOSIS — F70 Mild intellectual disabilities: Secondary | ICD-10-CM | POA: Diagnosis not present

## 2016-11-27 ENCOUNTER — Other Ambulatory Visit: Payer: Self-pay | Admitting: Family Medicine

## 2016-12-02 DIAGNOSIS — B351 Tinea unguium: Secondary | ICD-10-CM | POA: Diagnosis not present

## 2016-12-02 DIAGNOSIS — E1142 Type 2 diabetes mellitus with diabetic polyneuropathy: Secondary | ICD-10-CM | POA: Diagnosis not present

## 2016-12-02 DIAGNOSIS — L851 Acquired keratosis [keratoderma] palmaris et plantaris: Secondary | ICD-10-CM | POA: Diagnosis not present

## 2016-12-03 ENCOUNTER — Encounter: Payer: Self-pay | Admitting: *Deleted

## 2016-12-17 ENCOUNTER — Ambulatory Visit: Payer: Self-pay | Admitting: Pharmacist

## 2016-12-29 DIAGNOSIS — E559 Vitamin D deficiency, unspecified: Secondary | ICD-10-CM | POA: Diagnosis not present

## 2016-12-29 DIAGNOSIS — D509 Iron deficiency anemia, unspecified: Secondary | ICD-10-CM | POA: Diagnosis not present

## 2016-12-29 DIAGNOSIS — N183 Chronic kidney disease, stage 3 (moderate): Secondary | ICD-10-CM | POA: Diagnosis not present

## 2016-12-29 DIAGNOSIS — I10 Essential (primary) hypertension: Secondary | ICD-10-CM | POA: Diagnosis not present

## 2016-12-29 DIAGNOSIS — R809 Proteinuria, unspecified: Secondary | ICD-10-CM | POA: Diagnosis not present

## 2016-12-29 DIAGNOSIS — Z79899 Other long term (current) drug therapy: Secondary | ICD-10-CM | POA: Diagnosis not present

## 2016-12-31 ENCOUNTER — Other Ambulatory Visit: Payer: Self-pay | Admitting: Family Medicine

## 2017-01-02 ENCOUNTER — Other Ambulatory Visit: Payer: Medicare Other

## 2017-01-06 DIAGNOSIS — I10 Essential (primary) hypertension: Secondary | ICD-10-CM | POA: Diagnosis not present

## 2017-01-06 DIAGNOSIS — N183 Chronic kidney disease, stage 3 (moderate): Secondary | ICD-10-CM | POA: Diagnosis not present

## 2017-01-06 DIAGNOSIS — E875 Hyperkalemia: Secondary | ICD-10-CM | POA: Diagnosis not present

## 2017-01-06 DIAGNOSIS — E1129 Type 2 diabetes mellitus with other diabetic kidney complication: Secondary | ICD-10-CM | POA: Diagnosis not present

## 2017-01-15 ENCOUNTER — Ambulatory Visit (INDEPENDENT_AMBULATORY_CARE_PROVIDER_SITE_OTHER): Payer: Medicare Other

## 2017-01-15 DIAGNOSIS — Z23 Encounter for immunization: Secondary | ICD-10-CM

## 2017-02-02 ENCOUNTER — Ambulatory Visit (INDEPENDENT_AMBULATORY_CARE_PROVIDER_SITE_OTHER): Payer: Medicare Other | Admitting: Family Medicine

## 2017-02-02 ENCOUNTER — Encounter: Payer: Self-pay | Admitting: Family Medicine

## 2017-02-02 VITALS — BP 125/77 | HR 85 | Temp 99.0°F | Ht 65.0 in | Wt 219.1 lb

## 2017-02-02 DIAGNOSIS — R300 Dysuria: Secondary | ICD-10-CM | POA: Diagnosis not present

## 2017-02-02 DIAGNOSIS — E1165 Type 2 diabetes mellitus with hyperglycemia: Secondary | ICD-10-CM

## 2017-02-02 DIAGNOSIS — N179 Acute kidney failure, unspecified: Secondary | ICD-10-CM | POA: Diagnosis not present

## 2017-02-02 DIAGNOSIS — I10 Essential (primary) hypertension: Secondary | ICD-10-CM

## 2017-02-02 DIAGNOSIS — N189 Chronic kidney disease, unspecified: Secondary | ICD-10-CM | POA: Diagnosis not present

## 2017-02-02 DIAGNOSIS — K219 Gastro-esophageal reflux disease without esophagitis: Secondary | ICD-10-CM

## 2017-02-02 LAB — URINALYSIS, COMPLETE
BILIRUBIN UA: NEGATIVE
Leukocytes, UA: NEGATIVE
Nitrite, UA: NEGATIVE
PH UA: 5.5 (ref 5.0–7.5)
Specific Gravity, UA: >1.03 — ABNORMAL HIGH (ref 1.005–1.030)
UUROB: 1 mg/dL (ref 0.2–1.0)

## 2017-02-02 LAB — MICROSCOPIC EXAMINATION
Epithelial Cells (non renal): NONE SEEN /hpf (ref 0–10)
Renal Epithel, UA: NONE SEEN /hpf
WBC, UA: NONE SEEN /hpf (ref 0–?)

## 2017-02-02 NOTE — Progress Notes (Signed)
BP 125/77   Pulse 85   Temp 99 F (37.2 C) (Oral)   Ht _0  (1.651 m)   Wt 219 lb 2 oz (99.4 kg)   BMI 36.46 kg/m    Subjective:    Patient ID: David Zamora, male    DOB: 06-07-1960, 56 y.o.   MRN: [id number]  HPI: David Zamora is a 56 y.o. male presenting on 02/02/2017 for Diabetes (follow up; patient not fasting) and Hypertension   HPI Type 2 diabetes mellitus Patient comes in today for recheck of his diabetes. Patient has been currently taking Xultophy and glipizide and metformin. Patient is currently on an ACE inhibitor/ARB. Patient has seen an ophthalmologist this year. Patient denies any issues with their feet.   Hypertension Patient is currently on lisinopril 5 mg, and their blood pressure today is 125/77. Patient denies any lightheadedness or dizziness. Patient denies headaches, blurred vision, chest pains, shortness of breath, or weakness. Denies any side effects from medication and is content with current medication.   GERD Patient is currently on omeprazole.  he denies any major symptoms or abdominal pain or belching or burping. She denies any blood in her stool or lightheadedness or dizziness.   Dysuria and chronic kidney disease Patient complains of dysuria and darkened urine.  Concerned about worsening renal disease versus an infection.  Encouraged hydration and it sounds like he might not be keeping as well hydrated as he should be.  He denies any fevers or chills or abdominal pain or flank pain.  Relevant past medical, surgical, family and social history reviewed and updated as indicated. Interim medical history since our last visit reviewed. Allergies and medications reviewed and updated.  Review of Systems  Constitutional: Negative for chills and fever.  Eyes: Negative for discharge.  Respiratory: Negative for shortness of breath and wheezing.   Cardiovascular: Negative for chest pain and leg swelling.  Gastrointestinal: Negative for abdominal pain.    Genitourinary: Positive for dysuria and frequency. Negative for decreased urine volume, difficulty urinating, flank pain, hematuria and urgency.  Musculoskeletal: Negative for back pain and gait problem.  Skin: Negative for rash.  Neurological: Negative for dizziness, weakness, light-headedness and numbness.  All other systems reviewed and are negative.   Per HPI unless specifically indicated above     Objective:    BP 125/77   Pulse 85   Temp 99 F (37.2 C) (Oral)   Ht _1  (1.651 m)   Wt 219 lb 2 oz (99.4 kg)   BMI 36.46 kg/m   Wt Readings from Last 3 Encounters:  02/02/17 219 lb 2 oz (99.4 kg)  10/30/16 210 lb 6 oz (95.4 kg)  09/16/16 213 lb (96.6 kg)    Physical Exam  Constitutional: He is oriented to person, place, and time. He appears well-developed and well-nourished. No distress.  Eyes: Conjunctivae are normal. No scleral icterus.  Neck: Neck supple. No thyromegaly present.  Cardiovascular: Normal rate, regular rhythm, normal heart sounds and intact distal pulses.   No murmur heard. Pulmonary/Chest: Effort normal and breath sounds normal. No respiratory distress. He has no wheezes. He has no rales.  Abdominal: Soft. Bowel sounds are normal. He exhibits no distension. There is no tenderness. There is no rebound.  Musculoskeletal: Normal range of motion. He exhibits no edema.  Lymphadenopathy:    He has no cervical adenopathy.  Neurological: He is alert and oriented to person, place, and time. Coordination normal.  Skin: Skin is warm and dry. No rash  noted. He is not diaphoretic.  Psychiatric: He has a normal mood and affect. His behavior is normal.  Nursing note and vitals reviewed.  Urinalysis: 2+ glucose, 3+ protein, few bacteria, hyaline casts, trace RBCs and trace ketones    Assessment & Plan:   Problem List Items Addressed This Visit      Cardiovascular and Mediastinum   Essential hypertension (Chronic)   Relevant Orders   CMP14+EGFR (Completed)    Lipid panel (Completed)     Digestive   GERD (gastroesophageal reflux disease) (Chronic)     Endocrine   Uncontrolled diabetes mellitus ([place]) - Primary   Relevant Orders   Bayer DCA Hb A1c Waived (Completed)   CMP14+EGFR (Completed)   Lipid panel (Completed)     Genitourinary   Acute kidney injury superimposed on chronic kidney disease ([place])   Relevant Orders   CMP14+EGFR (Completed)    Other Visit Diagnoses    Dysuria       Relevant Orders   Urinalysis, Complete (Completed)   CMP14+EGFR (Completed)     Unlikely UTI based on urinalysis, symptoms had improved so will monitor for now.  Continue other medications and do labs today  Follow up plan: Return in about 3 months (around 05/05/2017), or if symptoms worsen or fail to improve, for Recheck diabetes.  Counseling provided for all of the vaccine components Orders Placed This Encounter  Procedures  . Bayer DCA Hb A1c Waived  . CMP14+EGFR  . Lipid panel    Caryl Pina, MD [facility] 02/02/2017, 11:19 AM

## 2017-02-03 ENCOUNTER — Other Ambulatory Visit: Payer: Medicare Other

## 2017-02-03 ENCOUNTER — Other Ambulatory Visit: Payer: Self-pay | Admitting: Family Medicine

## 2017-02-03 DIAGNOSIS — I10 Essential (primary) hypertension: Secondary | ICD-10-CM

## 2017-02-03 DIAGNOSIS — R300 Dysuria: Secondary | ICD-10-CM

## 2017-02-03 DIAGNOSIS — N189 Chronic kidney disease, unspecified: Secondary | ICD-10-CM

## 2017-02-03 DIAGNOSIS — E1165 Type 2 diabetes mellitus with hyperglycemia: Secondary | ICD-10-CM

## 2017-02-03 DIAGNOSIS — N179 Acute kidney failure, unspecified: Secondary | ICD-10-CM

## 2017-02-03 LAB — BAYER DCA HB A1C WAIVED: HB A1C: 7.1 % — AB (ref <7.0)

## 2017-02-04 LAB — CMP14+EGFR
ALBUMIN: 3.7 g/dL (ref 3.5–5.5)
ALT: 8 IU/L (ref 0–44)
AST: 10 IU/L (ref 0–40)
Albumin/Globulin Ratio: 1.9 (ref 1.2–2.2)
Alkaline Phosphatase: 58 IU/L (ref 39–117)
BUN/Creatinine Ratio: 13 (ref 9–20)
BUN: 15 mg/dL (ref 6–24)
Bilirubin Total: <0.2 mg/dL (ref 0.0–1.2)
CALCIUM: 9.4 mg/dL (ref 8.7–10.2)
CHLORIDE: 101 mmol/L (ref 96–106)
CO2: 27 mmol/L (ref 20–29)
CREATININE: 1.17 mg/dL (ref 0.76–1.27)
GFR, EST AFRICAN AMERICAN: 79 mL/min/{1.73_m2} (ref >59)
GFR, EST NON AFRICAN AMERICAN: 68 mL/min/{1.73_m2} (ref >59)
GLUCOSE: 145 mg/dL — AB (ref 65–99)
Globulin, Total: 2 g/dL (ref 1.5–4.5)
Potassium: 4.9 mmol/L (ref 3.5–5.2)
Sodium: 140 mmol/L (ref 134–144)
TOTAL PROTEIN: 5.7 g/dL — AB (ref 6.0–8.5)

## 2017-02-04 LAB — LIPID PANEL
Chol/HDL Ratio: 4.3 ratio (ref 0.0–5.0)
Cholesterol, Total: 167 mg/dL (ref 100–199)
HDL: 39 mg/dL — AB (ref >39)
LDL Calculated: 85 mg/dL (ref 0–99)
Triglycerides: 215 mg/dL — ABNORMAL HIGH (ref 0–149)
VLDL Cholesterol Cal: 43 mg/dL — ABNORMAL HIGH (ref 5–40)

## 2017-02-17 DIAGNOSIS — L851 Acquired keratosis [keratoderma] palmaris et plantaris: Secondary | ICD-10-CM | POA: Diagnosis not present

## 2017-02-17 DIAGNOSIS — B351 Tinea unguium: Secondary | ICD-10-CM | POA: Diagnosis not present

## 2017-02-17 DIAGNOSIS — E1142 Type 2 diabetes mellitus with diabetic polyneuropathy: Secondary | ICD-10-CM | POA: Diagnosis not present

## 2017-02-18 ENCOUNTER — Encounter: Payer: Self-pay | Admitting: Family Medicine

## 2017-02-18 ENCOUNTER — Ambulatory Visit (INDEPENDENT_AMBULATORY_CARE_PROVIDER_SITE_OTHER): Payer: Medicare Other | Admitting: Family Medicine

## 2017-02-18 VITALS — BP 117/81 | HR 77 | Temp 98.7°F | Ht 65.0 in | Wt 219.0 lb

## 2017-02-18 DIAGNOSIS — E1165 Type 2 diabetes mellitus with hyperglycemia: Secondary | ICD-10-CM | POA: Diagnosis not present

## 2017-02-18 DIAGNOSIS — I152 Hypertension secondary to endocrine disorders: Secondary | ICD-10-CM

## 2017-02-18 DIAGNOSIS — E1169 Type 2 diabetes mellitus with other specified complication: Secondary | ICD-10-CM

## 2017-02-18 DIAGNOSIS — E1365 Other specified diabetes mellitus with hyperglycemia: Secondary | ICD-10-CM

## 2017-02-18 DIAGNOSIS — E1159 Type 2 diabetes mellitus with other circulatory complications: Secondary | ICD-10-CM | POA: Diagnosis not present

## 2017-02-18 DIAGNOSIS — N182 Chronic kidney disease, stage 2 (mild): Secondary | ICD-10-CM

## 2017-02-18 DIAGNOSIS — E1322 Other specified diabetes mellitus with diabetic chronic kidney disease: Secondary | ICD-10-CM | POA: Diagnosis not present

## 2017-02-18 DIAGNOSIS — E781 Pure hyperglyceridemia: Secondary | ICD-10-CM

## 2017-02-18 DIAGNOSIS — K219 Gastro-esophageal reflux disease without esophagitis: Secondary | ICD-10-CM | POA: Diagnosis not present

## 2017-02-18 DIAGNOSIS — I10 Essential (primary) hypertension: Secondary | ICD-10-CM | POA: Diagnosis not present

## 2017-02-18 DIAGNOSIS — Z0000 Encounter for general adult medical examination without abnormal findings: Secondary | ICD-10-CM

## 2017-02-18 DIAGNOSIS — IMO0002 Reserved for concepts with insufficient information to code with codable children: Secondary | ICD-10-CM

## 2017-02-18 MED ORDER — ICOSAPENT ETHYL 0.5 G PO CAPS: 2.0000 | ORAL_CAPSULE | Freq: Two times a day (BID) | ORAL | 11 refills | Status: DC

## 2017-02-18 NOTE — Progress Notes (Signed)
BP 117/81   Pulse 77   Temp 98.7 F (37.1 C) (Oral)   Ht _0  (1.651 m)   Wt 219 lb (99.3 kg)   BMI 36.44 kg/m    Subjective:    Patient ID: David Zamora, male    DOB: 26-Oct-1960, 56 y.o.   MRN: [id number]  HPI: David Zamora is a 56 y.o. male presenting on 02/18/2017 for Annual Exam   HPI  Annual exam for facility that he lives at Patient is living at the [facility] and needs an annual physical with [place].  They deny him having any major health issues.  He is with a girlfriend currently but is not sexually active per him. Patient denies any chest pain, shortness of breath, headaches or vision issues, abdominal complaints, diarrhea, nausea, vomiting, or joint issues.   Type 2 diabetes mellitus Patient comes in today for recheck of his diabetes. Patient has been currently taking Xultophy and glipizide and metformin and his blood sugars have been running much better and his last A1c was 7.1. Patient is currently on an ACE inhibitor/ARB. Patient has not seen an ophthalmologist this year. Patient denies any issues with their feet and saw podiatrist last week.   GERD Patient is currently on omeprazole.  he denies any major symptoms or abdominal pain or belching or burping. She denies any blood in her stool or lightheadedness or dizziness.  Hypertension Patient is currently on lisinopril, and their blood pressure today is 117/81. Patient denies any lightheadedness or dizziness. Patient denies headaches, blurred vision, chest pains, shortness of breath, or weakness. Denies any side effects from medication and is content with current medication.   Hyperlipidemia Patient is coming in for recheck of his hyperlipidemia. The patient is currently taking no medication but will start him on the Cipro or another fish oil. They deny any issues with myalgias or history of liver damage from it. They deny any focal numbness or weakness or chest pain.   Relevant past medical, surgical, family and  social history reviewed and updated as indicated. Interim medical history since our last visit reviewed. Allergies and medications reviewed and updated.  Review of Systems  Constitutional: Negative for chills and fever.  HENT: Negative for ear pain and tinnitus.   Eyes: Negative for pain.  Respiratory: Negative for cough, shortness of breath and wheezing.   Cardiovascular: Negative for chest pain, palpitations and leg swelling.  Gastrointestinal: Negative for abdominal pain, blood in stool, constipation and diarrhea.  Genitourinary: Negative for dysuria and hematuria.  Musculoskeletal: Negative for back pain, gait problem and myalgias.  Skin: Negative for rash.  Neurological: Negative for dizziness, weakness, light-headedness and headaches.  Psychiatric/Behavioral: Negative for suicidal ideas.  All other systems reviewed and are negative.   Per HPI unless specifically indicated above        Objective:    BP 117/81   Pulse 77   Temp 98.7 F (37.1 C) (Oral)   Ht _1  (1.651 m)   Wt 219 lb (99.3 kg)   BMI 36.44 kg/m   Wt Readings from Last 3 Encounters:  02/18/17 219 lb (99.3 kg)  02/02/17 219 lb 2 oz (99.4 kg)  10/30/16 210 lb 6 oz (95.4 kg)    Physical Exam  Constitutional: He is oriented to person, place, and time. He appears well-developed and well-nourished. No distress.  HENT:  Right Ear: External ear normal.  Left Ear: External ear normal.  Nose: Nose normal.  Mouth/Throat: Oropharynx is clear and  moist. No oropharyngeal exudate.  Eyes: Conjunctivae and EOM are normal. Pupils are equal, round, and reactive to light. Right eye exhibits no discharge. No scleral icterus.  Neck: Neck supple. No thyromegaly present.  Cardiovascular: Normal rate, regular rhythm, normal heart sounds and intact distal pulses.  No murmur heard. Pulmonary/Chest: Effort normal and breath sounds normal. No respiratory distress. He has no wheezes.  Abdominal: Soft. Bowel sounds are normal.  He exhibits no distension. There is no tenderness. There is no rebound and no guarding. Hernia confirmed negative in the right inguinal area and confirmed negative in the left inguinal area.  Genitourinary: Testes normal and penis normal. Cremasteric reflex is present. Circumcised.  Musculoskeletal: Normal range of motion. He exhibits no edema.  Lymphadenopathy:    He has no cervical adenopathy.       Right: No inguinal adenopathy present.       Left: No inguinal adenopathy present.  Neurological: He is alert and oriented to person, place, and time. Coordination normal.  Skin: Skin is warm and dry. No rash noted. He is not diaphoretic.  Psychiatric: He has a normal mood and affect. His behavior is normal.  Vitals reviewed.   Results for orders placed or performed in visit on 02/03/17  Bayer DCA Hb A1c Waived  Result Value Ref Range   Bayer DCA Hb A1c Waived 7.1 (H) <7.0 %  CMP14+EGFR  Result Value Ref Range   Glucose 145 (H) 65 - 99 mg/dL   BUN 15 6 - 24 mg/dL   Creatinine, Ser 1.17 0.76 - 1.27 mg/dL   GFR calc non Af Amer 68 >59 mL/min/1.73   GFR calc Af Amer 79 >59 mL/min/1.73   BUN/Creatinine Ratio 13 9 - 20   Sodium 140 134 - 144 mmol/L   Potassium 4.9 3.5 - 5.2 mmol/L   Chloride 101 96 - 106 mmol/L   CO2 27 20 - 29 mmol/L   Calcium 9.4 8.7 - 10.2 mg/dL   Total Protein 5.7 (L) 6.0 - 8.5 g/dL   Albumin 3.7 3.5 - 5.5 g/dL   Globulin, Total 2.0 1.5 - 4.5 g/dL   Albumin/Globulin Ratio 1.9 1.2 - 2.2   Bilirubin Total <0.2 0.0 - 1.2 mg/dL   Alkaline Phosphatase 58 39 - 117 IU/L   AST 10 0 - 40 IU/L   ALT 8 0 - 44 IU/L  Lipid panel  Result Value Ref Range   Cholesterol, Total 167 100 - 199 mg/dL   Triglycerides 215 (H) 0 - 149 mg/dL   HDL 39 (L) >39 mg/dL   VLDL Cholesterol Cal 43 (H) 5 - 40 mg/dL   LDL Calculated 85 0 - 99 mg/dL   Chol/HDL Ratio 4.3 0.0 - 5.0 ratio      Assessment & Plan:   Problem List Items Addressed This Visit      Cardiovascular and Mediastinum     Hypertension associated with diabetes ([place])   Relevant Medications   Icosapent Ethyl (VASCEPA) 0.5 g CAPS     Digestive   GERD (gastroesophageal reflux disease) (Chronic)     Endocrine   Uncontrolled diabetes mellitus (HCC)   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  Will continue with her current medications and get him a fish oil.  Follow up plan: Return in about 3 months (around 05/21/2017), or if symptoms worsen or fail to improve, for Diabetes recheck.  Counseling provided for all of the vaccine components No orders of the defined types were placed in this encounter.   Caryl Pina, MD [facility] 02/18/2017, 2:21 PM

## 2017-02-19 NOTE — Addendum Note (Signed)
Addended by: Caryl Pina on: 02/19/2017 12:53 PM   Modules accepted: Level of Service

## 2017-02-25 DIAGNOSIS — F70 Mild intellectual disabilities: Secondary | ICD-10-CM | POA: Diagnosis not present

## 2017-04-15 ENCOUNTER — Other Ambulatory Visit: Payer: Medicare Other

## 2017-04-15 DIAGNOSIS — D509 Iron deficiency anemia, unspecified: Secondary | ICD-10-CM | POA: Diagnosis not present

## 2017-04-15 DIAGNOSIS — R809 Proteinuria, unspecified: Secondary | ICD-10-CM | POA: Diagnosis not present

## 2017-04-15 DIAGNOSIS — N183 Chronic kidney disease, stage 3 (moderate): Secondary | ICD-10-CM | POA: Diagnosis not present

## 2017-04-15 DIAGNOSIS — I10 Essential (primary) hypertension: Secondary | ICD-10-CM | POA: Diagnosis not present

## 2017-04-15 DIAGNOSIS — Z79899 Other long term (current) drug therapy: Secondary | ICD-10-CM | POA: Diagnosis not present

## 2017-04-15 DIAGNOSIS — E559 Vitamin D deficiency, unspecified: Secondary | ICD-10-CM | POA: Diagnosis not present

## 2017-04-15 DIAGNOSIS — Z1159 Encounter for screening for other viral diseases: Secondary | ICD-10-CM | POA: Diagnosis not present

## 2017-04-20 ENCOUNTER — Other Ambulatory Visit: Payer: Self-pay | Admitting: Family Medicine

## 2017-04-21 DIAGNOSIS — I509 Heart failure, unspecified: Secondary | ICD-10-CM | POA: Diagnosis not present

## 2017-04-21 DIAGNOSIS — N182 Chronic kidney disease, stage 2 (mild): Secondary | ICD-10-CM | POA: Diagnosis not present

## 2017-04-21 DIAGNOSIS — R809 Proteinuria, unspecified: Secondary | ICD-10-CM | POA: Diagnosis not present

## 2017-04-21 DIAGNOSIS — E875 Hyperkalemia: Secondary | ICD-10-CM | POA: Diagnosis not present

## 2017-04-21 DIAGNOSIS — E559 Vitamin D deficiency, unspecified: Secondary | ICD-10-CM | POA: Diagnosis not present

## 2017-04-21 DIAGNOSIS — E1129 Type 2 diabetes mellitus with other diabetic kidney complication: Secondary | ICD-10-CM | POA: Diagnosis not present

## 2017-04-21 DIAGNOSIS — I10 Essential (primary) hypertension: Secondary | ICD-10-CM | POA: Diagnosis not present

## 2017-04-22 DIAGNOSIS — Z79899 Other long term (current) drug therapy: Secondary | ICD-10-CM | POA: Diagnosis not present

## 2017-04-23 ENCOUNTER — Other Ambulatory Visit: Payer: Self-pay | Admitting: Family Medicine

## 2017-04-23 ENCOUNTER — Other Ambulatory Visit: Payer: Self-pay | Admitting: *Deleted

## 2017-04-23 NOTE — Telephone Encounter (Signed)
Fax RF request ASA low dose 81 mg #30 Note pt lives in group home needs signed order even though OTC Signed fax fax back approving 11 RFs

## 2017-04-28 DIAGNOSIS — L851 Acquired keratosis [keratoderma] palmaris et plantaris: Secondary | ICD-10-CM | POA: Diagnosis not present

## 2017-04-28 DIAGNOSIS — B351 Tinea unguium: Secondary | ICD-10-CM | POA: Diagnosis not present

## 2017-04-28 DIAGNOSIS — E1142 Type 2 diabetes mellitus with diabetic polyneuropathy: Secondary | ICD-10-CM | POA: Diagnosis not present

## 2017-05-07 ENCOUNTER — Telehealth: Payer: Self-pay | Admitting: Family Medicine

## 2017-05-07 ENCOUNTER — Ambulatory Visit (INDEPENDENT_AMBULATORY_CARE_PROVIDER_SITE_OTHER): Payer: Medicare Other | Admitting: Family Medicine

## 2017-05-07 ENCOUNTER — Encounter: Payer: Self-pay | Admitting: Family Medicine

## 2017-05-07 VITALS — BP 122/79 | HR 96 | Temp 98.6°F | Ht 65.0 in | Wt 218.4 lb

## 2017-05-07 DIAGNOSIS — E1159 Type 2 diabetes mellitus with other circulatory complications: Secondary | ICD-10-CM | POA: Diagnosis not present

## 2017-05-07 DIAGNOSIS — I10 Essential (primary) hypertension: Secondary | ICD-10-CM

## 2017-05-07 DIAGNOSIS — E1169 Type 2 diabetes mellitus with other specified complication: Secondary | ICD-10-CM | POA: Diagnosis not present

## 2017-05-07 DIAGNOSIS — N182 Chronic kidney disease, stage 2 (mild): Secondary | ICD-10-CM

## 2017-05-07 DIAGNOSIS — E781 Pure hyperglyceridemia: Secondary | ICD-10-CM

## 2017-05-07 DIAGNOSIS — E1122 Type 2 diabetes mellitus with diabetic chronic kidney disease: Secondary | ICD-10-CM

## 2017-05-07 DIAGNOSIS — I152 Hypertension secondary to endocrine disorders: Secondary | ICD-10-CM

## 2017-05-07 LAB — BAYER DCA HB A1C WAIVED: HB A1C (BAYER DCA - WAIVED): 7.7 % — ABNORMAL HIGH (ref <7.0)

## 2017-05-07 MED ORDER — FREESTYLE LIBRE 14 DAY SENSOR MISC: 1.0000 | 11 refills | Status: DC

## 2017-05-07 MED ORDER — FREESTYLE LIBRE 14 DAY READER DEVI: 1.0000 | Freq: Three times a day (TID) | 0 refills | Status: DC

## 2017-05-07 NOTE — Progress Notes (Signed)
BP 122/79   Pulse 96   Temp 98.6 F (37 C) (Oral)   Ht '5\' 5"'  (1.651 m)   Wt 218 lb 6 oz (99.1 kg)   BMI 36.34 kg/m    Subjective:    Patient ID: David Zamora, male    DOB: 1961-03-05, 57 y.o.   MRN: [id number]  HPI: David Zamora is a 57 y.o. male presenting on 05/07/2017 for Diabetes (3 mo) and Hypertension   HPI Hypertension Patient is currently on lisinopril, and their blood pressure today is 122/79. Patient denies any lightheadedness or dizziness. Patient denies headaches, blurred vision, chest pains, shortness of breath, or weakness. Denies any side effects from medication and is content with current medication.   Type 2 diabetes mellitus Patient comes in today for recheck of his diabetes. Patient has been currently taking metformin and Xultophy 35 units and glipizide. Patient is currently on an ACE inhibitor/ARB. Patient has not seen an ophthalmologist this year. Patient denies any issues with their feet.   Hyperlipidemia Patient is coming in for recheck of his hyperlipidemia. The patient is currently taking simvastatin. They deny any issues with myalgias or history of liver damage from it. They deny any focal numbness or weakness or chest pain.   Relevant past medical, surgical, family and social history reviewed and updated as indicated. Interim medical history since our last visit reviewed. Allergies and medications reviewed and updated.  Review of Systems  Constitutional: Negative for chills and fever.  Respiratory: Negative for shortness of breath and wheezing.   Cardiovascular: Negative for chest pain and leg swelling.  Musculoskeletal: Negative for back pain and gait problem.  Skin: Negative for rash.  Neurological: Negative for dizziness, weakness, light-headedness and numbness.  All other systems reviewed and are negative.   Per HPI unless specifically indicated above   Allergies as of 05/07/2017      Reactions   Soap    White soaps      Medication  List        Accurate as of 05/07/17  1:51 PM. Always use your most recent med list.          aspirin 81 MG EC tablet Commonly known as:  QC LO-DOSE ASPIRIN TAKE (1) TABLET BY MOUTH ONCE DAILY.   blood glucose meter kit and supplies Kit Dispense based on patient and insurance preference. Use up to four times daily as directed. (FOR ICD-9 250.00, 250.01).   BLOOD GLUCOSE TEST STRIPS Strp Use to test blood sugar 3 times a day as needed for signs/symptoms of hypoglycemia shakiness,sweating,blurred vision   buPROPion 150 MG 24 hr tablet Commonly known as:  WELLBUTRIN XL Take 150 mg by mouth every morning.   busPIRone 10 MG tablet Commonly known as:  BUSPAR Take 10 mg by mouth 2 (two) times daily.   CERAVE Crea APPLY 1 APPLICATION ONCE A DAY TO BOTH FEET   divalproex 250 MG 24 hr tablet Commonly known as:  DEPAKOTE ER Take 250-1,000 mg by mouth 2 (two) times daily. 1 tablet in the morning and 4 tablets at bedtime   furosemide 20 MG tablet Commonly known as:  LASIX TAKE 1 TABLET BY MOUTH ONCE DAILY AS NEEDED FOR WEIGHT GAIN GREATER THAN 3 LBS   GLIPIZIDE XL 10 MG 24 hr tablet Generic drug:  glipiZIDE TAKE (1) TABLET BY MOUTH ONCE DAILY.   Icosapent Ethyl 0.5 g Caps Commonly known as:  VASCEPA Take 2 capsules 2 (two) times daily by mouth.   lisinopril  5 MG tablet Commonly known as:  PRINIVIL,ZESTRIL Take 5 mg by mouth daily.   metFORMIN 1000 MG tablet Commonly known as:  GLUCOPHAGE Take 1 tablet (1,000 mg total) by mouth 2 (two) times daily with a meal.   omeprazole 20 MG capsule Commonly known as:  PRILOSEC TAKE 1 CAPSULE BY MOUTH ONCE DAILY.   PARoxetine 20 MG tablet Commonly known as:  PAXIL Take 20 mg by mouth every morning.   Pen Needles 32G X 4 MM Misc 1 each by Does not apply route daily. Use to injection Xultophy daily   GLOBAL EASE INJECT PEN NEEDLES 31G X 8 MM Misc Generic drug:  Insulin Pen Needle USE TO INJECT XULTOPHY DAILY   QUEtiapine 300 MG  24 hr tablet Commonly known as:  SEROQUEL XR Take 300 mg by mouth daily. Taken daily at [place] 0.5-1-0.5 % Soln Generic drug:  Carboxymeth-Glycerin-Polysorb Apply 1 drop to eye 4 (four) times daily.   simvastatin 40 MG tablet Commonly known as:  ZOCOR TAKE (1) TABLET BY MOUTH ONCE DAILY.   tamsulosin 0.4 MG Caps capsule Commonly known as:  FLOMAX TAKE 1 CAPSULE BY MOUTH ONCE A DAY.   triamcinolone ointment 0.5 % Commonly known as:  KENALOG APPLY TOPICALLY TWICE DAILY.   Vitamin D3 50000 units Tabs Take 50,000 Units by mouth once a week.   XULTOPHY 100-3.6 UNIT-MG/ML Sopn Generic drug:  Insulin Degludec-Liraglutide INJECT 35 UNITS INTO THE SKIN DAILY          Objective:    BP 122/79   Pulse 96   Temp 98.6 F (37 C) (Oral)   Ht '5\' 5"'  (1.651 m)   Wt 218 lb 6 oz (99.1 kg)   BMI 36.34 kg/m   Wt Readings from Last 3 Encounters:  05/07/17 218 lb 6 oz (99.1 kg)  02/18/17 219 lb (99.3 kg)  02/02/17 219 lb 2 oz (99.4 kg)    Physical Exam  Constitutional: He is oriented to person, place, and time. He appears well-developed and well-nourished. No distress.  Eyes: Conjunctivae are normal. No scleral icterus.  Neck: Neck supple. No thyromegaly present.  Cardiovascular: Normal rate, regular rhythm, normal heart sounds and intact distal pulses.  No murmur heard. Pulmonary/Chest: Effort normal and breath sounds normal. No respiratory distress. He has no wheezes.  Musculoskeletal: Normal range of motion. He exhibits no edema.  Lymphadenopathy:    He has no cervical adenopathy.  Neurological: He is alert and oriented to person, place, and time. Coordination normal.  Skin: Skin is warm and dry. No rash noted. He is not diaphoretic.  Psychiatric: He has a normal mood and affect. His behavior is normal.  Nursing note and vitals reviewed.   Results for orders placed or performed in visit on 02/03/17  Bayer DCA Hb A1c Waived  Result Value Ref Range    Bayer DCA Hb A1c Waived 7.1 (H) <7.0 %  CMP14+EGFR  Result Value Ref Range   Glucose 145 (H) 65 - 99 mg/dL   BUN 15 6 - 24 mg/dL   Creatinine, Ser 1.17 0.76 - 1.27 mg/dL   GFR calc non Af Amer 68 >59 mL/min/1.73   GFR calc Af Amer 79 >59 mL/min/1.73   BUN/Creatinine Ratio 13 9 - 20   Sodium 140 134 - 144 mmol/L   Potassium 4.9 3.5 - 5.2 mmol/L   Chloride 101 96 - 106 mmol/L   CO2 27 20 - 29 mmol/L   Calcium 9.4 8.7 - 10.2  mg/dL   Total Protein 5.7 (L) 6.0 - 8.5 g/dL   Albumin 3.7 3.5 - 5.5 g/dL   Globulin, Total 2.0 1.5 - 4.5 g/dL   Albumin/Globulin Ratio 1.9 1.2 - 2.2   Bilirubin Total <0.2 0.0 - 1.2 mg/dL   Alkaline Phosphatase 58 39 - 117 IU/L   AST 10 0 - 40 IU/L   ALT 8 0 - 44 IU/L  Lipid panel  Result Value Ref Range   Cholesterol, Total 167 100 - 199 mg/dL   Triglycerides 215 (H) 0 - 149 mg/dL   HDL 39 (L) >39 mg/dL   VLDL Cholesterol Cal 43 (H) 5 - 40 mg/dL   LDL Calculated 85 0 - 99 mg/dL   Chol/HDL Ratio 4.3 0.0 - 5.0 ratio      Assessment & Plan:   Problem List Items Addressed This Visit      Cardiovascular and Mediastinum   Hypertension associated with diabetes ([place])     Endocrine   Controlled diabetes mellitus ([place])   Relevant Orders   Bayer DCA Hb A1c Waived   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  Follow up plan: Return in about 3 months (around 08/05/2017), or if symptoms worsen or fail to improve, for Diabetes recheck.  Counseling provided for all of the vaccine components Orders Placed This Encounter  Procedures  . Bayer [facility] Hb A1c Dunn, MD [facility] 05/07/2017, 1:51 PM

## 2017-05-07 NOTE — Patient Instructions (Signed)
Increase xultophy to 40 units

## 2017-05-07 NOTE — Telephone Encounter (Signed)
Okay thanks for letting me know,I guess we will have to stick with a fingerstick because of insurance

## 2017-05-25 ENCOUNTER — Other Ambulatory Visit: Payer: Self-pay | Admitting: Family Medicine

## 2017-05-27 ENCOUNTER — Ambulatory Visit: Payer: Medicare Other | Admitting: Family Medicine

## 2017-05-28 ENCOUNTER — Ambulatory Visit (INDEPENDENT_AMBULATORY_CARE_PROVIDER_SITE_OTHER): Payer: Medicare Other | Admitting: Family Medicine

## 2017-05-28 ENCOUNTER — Encounter: Payer: Self-pay | Admitting: Family Medicine

## 2017-05-28 VITALS — BP 128/83 | HR 81 | Temp 98.2°F | Ht 65.0 in | Wt 224.0 lb

## 2017-05-28 DIAGNOSIS — R519 Headache, unspecified: Secondary | ICD-10-CM

## 2017-05-28 DIAGNOSIS — R51 Headache: Secondary | ICD-10-CM | POA: Diagnosis not present

## 2017-05-28 MED ORDER — FLUTICASONE PROPIONATE 50 MCG/ACT NA SUSP: 1.0000 | Freq: Two times a day (BID) | NASAL | 6 refills | Status: DC | PRN

## 2017-05-28 MED ORDER — DEXTROMETHORPHAN-GUAIFENESIN 10-100 MG/5ML PO LIQD: 5.0000 mL | ORAL | 3 refills | Status: DC | PRN

## 2017-05-28 MED ORDER — CEFDINIR 300 MG PO CAPS: 300.0000 mg | ORAL_CAPSULE | Freq: Two times a day (BID) | ORAL | 0 refills | Status: DC

## 2017-05-28 NOTE — Progress Notes (Signed)
BP 128/83   Pulse 81   Temp 98.2 F (36.8 C) (Oral)   Ht '5\' 5"'$  (1.651 m)   Wt 224 lb (101.6 kg)   BMI 37.28 kg/m    Subjective:    Patient ID: David Zamora, male    DOB: [birth year], 57 y.o.   MRN: [id number]  HPI: David Zamora is a 57 y.o. male presenting on 05/28/2017 for Frequent headaches (x 2 weeks, always happens during the day; has standing order for Extra Strength Tylenol, 500 mg, one tablet tid prn)   HPI Headaches and sinus congestion Patient comes in with headache and congestion and sinus pressure and drainage that is been going on for the past 2 weeks.  He denies having any fevers or chills or shortness of breath or wheezing.  Sugars been running okay over the past couple weeks and this headache syndrome/sinus syndrome has not been affecting it.  He has been using Tylenol Extra Strength which has been helping the headaches some but it has not been sufficient.  Relevant past medical, surgical, family and social history reviewed and updated as indicated. Interim medical history since our last visit reviewed. Allergies and medications reviewed and updated.  Review of Systems  Constitutional: Negative for chills and fever.  HENT: Positive for congestion, postnasal drip, rhinorrhea, sinus pressure, sneezing and sore throat. Negative for ear discharge, ear pain and voice change.   Eyes: Negative for pain, discharge, redness and visual disturbance.  Respiratory: Positive for cough. Negative for shortness of breath and wheezing.   Cardiovascular: Negative for chest pain and leg swelling.  Musculoskeletal: Negative for gait problem.  Skin: Negative for rash.  All other systems reviewed and are negative.   Per HPI unless specifically indicated above   Allergies as of 05/28/2017      Reactions   Soap    White soaps      Medication List        Accurate as of 05/28/17  2:22 PM. Always use your most recent med list.          aspirin 81 MG EC tablet Commonly known  as:  QC LO-DOSE ASPIRIN TAKE (1) TABLET BY MOUTH ONCE DAILY.   blood glucose meter kit and supplies Kit Dispense based on patient and insurance preference. Use up to four times daily as directed. (FOR ICD-9 250.00, 250.01).   BLOOD GLUCOSE TEST STRIPS Strp Use to test blood sugar 3 times a day as needed for signs/symptoms of hypoglycemia shakiness,sweating,blurred vision   buPROPion 150 MG 24 hr tablet Commonly known as:  WELLBUTRIN XL Take 150 mg by mouth every morning.   busPIRone 10 MG tablet Commonly known as:  BUSPAR Take 10 mg by mouth 2 (two) times daily.   cefdinir 300 MG capsule Commonly known as:  OMNICEF Take 1 capsule (300 mg total) by mouth 2 (two) times daily. 1 po BID   CERAVE Crea APPLY 1 APPLICATION ONCE A DAY TO BOTH FEET   dextromethorphan-guaiFENesin 10-100 MG/5ML liquid Commonly known as:  MEDI-TUSSIN DM DIABETIC Take 5 mLs by mouth every 4 (four) hours as needed for cough.   divalproex 250 MG 24 hr tablet Commonly known as:  DEPAKOTE ER Take 250-1,000 mg by mouth 2 (two) times daily. 1 tablet in the morning and 4 tablets at bedtime   fluticasone 50 MCG/ACT nasal spray Commonly known as:  FLONASE Place 1 spray into both nostrils 2 (two) times daily as needed for allergies or rhinitis.   FREESTYLE  LIBRE 14 DAY READER Devi 1 each by Does not apply route 3 (three) times daily.   FREESTYLE LIBRE 14 DAY SENSOR Misc 1 each by Does not apply route every 14 (fourteen) days.   furosemide 20 MG tablet Commonly known as:  LASIX TAKE 1 TABLET BY MOUTH ONCE DAILY AS NEEDED FOR WEIGHT GAIN GREATER THAN 3 LBS   GLIPIZIDE XL 10 MG 24 hr tablet Generic drug:  glipiZIDE TAKE (1) TABLET BY MOUTH ONCE DAILY.   glipiZIDE 10 MG 24 hr tablet Commonly known as:  GLUCOTROL XL TAKE (1) TABLET BY MOUTH ONCE DAILY.   Icosapent Ethyl 0.5 g Caps Commonly known as:  VASCEPA Take 2 capsules 2 (two) times daily by mouth.   lisinopril 5 MG tablet Commonly known as:   PRINIVIL,ZESTRIL Take 5 mg by mouth daily.   metFORMIN 1000 MG tablet Commonly known as:  GLUCOPHAGE Take 1 tablet (1,000 mg total) by mouth 2 (two) times daily with a meal.   omeprazole 20 MG capsule Commonly known as:  PRILOSEC TAKE 1 CAPSULE BY MOUTH ONCE DAILY.   PARoxetine 20 MG tablet Commonly known as:  PAXIL Take 20 mg by mouth every morning.   Pen Needles 32G X 4 MM Misc 1 each by Does not apply route daily. Use to injection Xultophy daily   GLOBAL EASE INJECT PEN NEEDLES 31G X 8 MM Misc Generic drug:  Insulin Pen Needle USE TO INJECT XULTOPHY DAILY   QUEtiapine 300 MG 24 hr tablet Commonly known as:  SEROQUEL XR Take 300 mg by mouth daily. Taken daily at [place] 0.5-1-0.5 % Soln Generic drug:  Carboxymeth-Glycerin-Polysorb Apply 1 drop to eye 4 (four) times daily.   simvastatin 40 MG tablet Commonly known as:  ZOCOR TAKE (1) TABLET BY MOUTH ONCE DAILY.   tamsulosin 0.4 MG Caps capsule Commonly known as:  FLOMAX TAKE 1 CAPSULE BY MOUTH ONCE A DAY.   triamcinolone ointment 0.5 % Commonly known as:  KENALOG APPLY TOPICALLY TWICE DAILY.   Vitamin D3 50000 units Tabs Take 50,000 Units by mouth once a week.   XULTOPHY 100-3.6 UNIT-MG/ML Sopn Generic drug:  Insulin Degludec-Liraglutide INJECT 35 UNITS INTO THE SKIN DAILY          Objective:    BP 128/83   Pulse 81   Temp 98.2 F (36.8 C) (Oral)   Ht '5\' 5"'$  (1.651 m)   Wt 224 lb (101.6 kg)   BMI 37.28 kg/m   Wt Readings from Last 3 Encounters:  05/28/17 224 lb (101.6 kg)  05/07/17 218 lb 6 oz (99.1 kg)  02/18/17 219 lb (99.3 kg)    Physical Exam  Constitutional: He is oriented to person, place, and time. He appears well-developed and well-nourished. No distress.  HENT:  Right Ear: Tympanic membrane, external ear and ear canal normal.  Left Ear: Tympanic membrane, external ear and ear canal normal.  Nose: Mucosal edema and rhinorrhea present. No sinus tenderness. No  epistaxis. Right sinus exhibits frontal sinus tenderness. Right sinus exhibits no maxillary sinus tenderness. Left sinus exhibits frontal sinus tenderness. Left sinus exhibits no maxillary sinus tenderness.  Mouth/Throat: Uvula is midline and mucous membranes are normal. Posterior oropharyngeal edema and posterior oropharyngeal erythema present. No oropharyngeal exudate or tonsillar abscesses.  Eyes: Conjunctivae and EOM are normal. Pupils are equal, round, and reactive to light. No scleral icterus.  Neck: Neck supple. No thyromegaly present.  Cardiovascular: Normal rate, regular rhythm, normal heart sounds and intact distal  pulses.  No murmur heard. Pulmonary/Chest: Effort normal and breath sounds normal. No respiratory distress. He has no wheezes. He has no rales.  Musculoskeletal: Normal range of motion. He exhibits no edema.  Lymphadenopathy:    He has no cervical adenopathy.  Neurological: He is alert and oriented to person, place, and time. Coordination normal.  Skin: Skin is warm and dry. No rash noted. He is not diaphoretic.  Psychiatric: He has a normal mood and affect. His behavior is normal.  Nursing note and vitals reviewed.       Assessment & Plan:   Problem List Items Addressed This Visit    None    Visit Diagnoses    Sinus headache    -  Primary   Relevant Medications   fluticasone (FLONASE) 50 MCG/ACT nasal spray   cefdinir (OMNICEF) 300 MG capsule   dextromethorphan-guaiFENesin (MEDI-TUSSIN DM DIABETIC) 10-100 MG/5ML liquid       Follow up plan: Return if symptoms worsen or fail to improve.  Counseling provided for all of the vaccine components No orders of the defined types were placed in this encounter.   Caryl Pina, MD [facility] 05/28/2017, 2:22 PM

## 2017-06-02 DIAGNOSIS — F319 Bipolar disorder, unspecified: Secondary | ICD-10-CM | POA: Diagnosis not present

## 2017-06-26 ENCOUNTER — Telehealth: Payer: Self-pay | Admitting: Family Medicine

## 2017-06-26 ENCOUNTER — Other Ambulatory Visit: Payer: Self-pay | Admitting: *Deleted

## 2017-06-26 ENCOUNTER — Other Ambulatory Visit: Payer: Self-pay | Admitting: Family Medicine

## 2017-06-26 MED ORDER — SIMVASTATIN 40 MG PO TABS: ORAL_TABLET | ORAL | 0 refills | Status: DC

## 2017-06-26 MED ORDER — INSULIN DEGLUDEC-LIRAGLUTIDE 100-3.6 UNIT-MG/ML ~~LOC~~ SOPN: 40.0000 [IU] | PEN_INJECTOR | Freq: Every day | SUBCUTANEOUS | 11 refills | Status: DC

## 2017-06-26 NOTE — Telephone Encounter (Signed)
He should be on 40, go ahead and send the prescription refill for 40 units

## 2017-06-26 NOTE — Telephone Encounter (Signed)
Per pharmacy pt needs new RX for Xultophy Directions in Epic are for 35 units After visit summary from 05/07/2017 visit states to increase to 40 units Please review and advise

## 2017-06-26 NOTE — Telephone Encounter (Signed)
There is not a very easily converted 1 to this because it is a combo medication, we will just have to try a different pharmacy if they are out or if he has enough to get him through until they get back in stock and we can do that as well.

## 2017-06-26 NOTE — Telephone Encounter (Signed)
See previous note

## 2017-06-26 NOTE — Telephone Encounter (Signed)
Note says pharmacy is out of stock on insulin. Please advise?

## 2017-06-26 NOTE — Telephone Encounter (Signed)
Per [facility] the RX was cancelled because of dosage increase on after visit summary New RX sent to [facility] today per Dr Dettinger Per Rowdie with [facility], med will have to be ordered and will not be available until Monday Per Dr Dettinger sample of [place] Aas will be given to pt with same directions until med is available at [facility] with [place] at group home They will pick up sample and verbalize understanding of dosage

## 2017-06-26 NOTE — Telephone Encounter (Signed)
Pt is out if insulin please refill today if possible. Use [facility] in [place]

## 2017-06-29 ENCOUNTER — Other Ambulatory Visit: Payer: Medicare Other

## 2017-06-29 DIAGNOSIS — N183 Chronic kidney disease, stage 3 (moderate): Secondary | ICD-10-CM | POA: Diagnosis not present

## 2017-06-29 DIAGNOSIS — D509 Iron deficiency anemia, unspecified: Secondary | ICD-10-CM | POA: Diagnosis not present

## 2017-06-29 DIAGNOSIS — R809 Proteinuria, unspecified: Secondary | ICD-10-CM | POA: Diagnosis not present

## 2017-06-29 DIAGNOSIS — I10 Essential (primary) hypertension: Secondary | ICD-10-CM | POA: Diagnosis not present

## 2017-06-29 DIAGNOSIS — Z79899 Other long term (current) drug therapy: Secondary | ICD-10-CM | POA: Diagnosis not present

## 2017-06-29 DIAGNOSIS — E559 Vitamin D deficiency, unspecified: Secondary | ICD-10-CM | POA: Diagnosis not present

## 2017-07-07 DIAGNOSIS — B351 Tinea unguium: Secondary | ICD-10-CM | POA: Diagnosis not present

## 2017-07-07 DIAGNOSIS — E1142 Type 2 diabetes mellitus with diabetic polyneuropathy: Secondary | ICD-10-CM | POA: Diagnosis not present

## 2017-07-07 DIAGNOSIS — L851 Acquired keratosis [keratoderma] palmaris et plantaris: Secondary | ICD-10-CM | POA: Diagnosis not present

## 2017-07-09 ENCOUNTER — Other Ambulatory Visit: Payer: Self-pay | Admitting: Family Medicine

## 2017-08-07 ENCOUNTER — Telehealth: Payer: Self-pay | Admitting: Family Medicine

## 2017-08-07 NOTE — Telephone Encounter (Signed)
Dr Lajuana Ripple signed

## 2017-08-13 ENCOUNTER — Encounter: Payer: Self-pay | Admitting: Family Medicine

## 2017-08-13 ENCOUNTER — Ambulatory Visit (INDEPENDENT_AMBULATORY_CARE_PROVIDER_SITE_OTHER): Payer: Medicare Other | Admitting: Family Medicine

## 2017-08-13 VITALS — BP 139/89 | HR 87 | Temp 99.3°F | Ht 65.0 in | Wt 221.4 lb

## 2017-08-13 DIAGNOSIS — L0231 Cutaneous abscess of buttock: Secondary | ICD-10-CM

## 2017-08-13 DIAGNOSIS — K219 Gastro-esophageal reflux disease without esophagitis: Secondary | ICD-10-CM

## 2017-08-13 DIAGNOSIS — I10 Essential (primary) hypertension: Secondary | ICD-10-CM | POA: Diagnosis not present

## 2017-08-13 DIAGNOSIS — E1122 Type 2 diabetes mellitus with diabetic chronic kidney disease: Secondary | ICD-10-CM

## 2017-08-13 DIAGNOSIS — F3341 Major depressive disorder, recurrent, in partial remission: Secondary | ICD-10-CM

## 2017-08-13 DIAGNOSIS — E1159 Type 2 diabetes mellitus with other circulatory complications: Secondary | ICD-10-CM | POA: Diagnosis not present

## 2017-08-13 DIAGNOSIS — I152 Hypertension secondary to endocrine disorders: Secondary | ICD-10-CM

## 2017-08-13 DIAGNOSIS — E1169 Type 2 diabetes mellitus with other specified complication: Secondary | ICD-10-CM

## 2017-08-13 DIAGNOSIS — N182 Chronic kidney disease, stage 2 (mild): Secondary | ICD-10-CM

## 2017-08-13 DIAGNOSIS — E781 Pure hyperglyceridemia: Secondary | ICD-10-CM

## 2017-08-13 LAB — BAYER DCA HB A1C WAIVED: HB A1C: 7.4 % — AB (ref <7.0)

## 2017-08-13 MED ORDER — CEPHALEXIN 500 MG PO CAPS: 500.0000 mg | ORAL_CAPSULE | Freq: Four times a day (QID) | ORAL | 0 refills | Status: DC

## 2017-08-13 NOTE — Progress Notes (Signed)
BP 139/89   Pulse 87   Temp 99.3 F (37.4 C) (Oral)   Ht _0  (1.651 m)   Wt 221 lb 6.4 oz (100.4 kg)   BMI 36.84 kg/m    Subjective:    Patient ID: David Zamora, male    DOB: 21-May-1960, 57 y.o.   MRN: [id number]  HPI: David Zamora is a 57 y.o. male presenting on 08/13/2017 for Diabetes; Hypertension; and Depression   HPI Hypertension Patient is currently on lisinopril, and their blood pressure today is 139/89. Patient denies any lightheadedness or dizziness. Patient denies headaches, blurred vision, chest pains, shortness of breath, or weakness. Denies any side effects from medication and is content with current medication.   Type 2 diabetes mellitus Patient comes in today for recheck of his diabetes. Patient has been currently taking glipizide and metformin and Xultophy. Patient is currently on an ACE inhibitor/ARB. Patient has not seen an ophthalmologist this year. Patient denies any issues with their feet.   GERD Patient is currently on omeprazole.  he denies any major symptoms or abdominal pain or belching or burping. She denies any blood in her stool or lightheadedness or dizziness.   Hypertriglyceridemia Patient is coming in for recheck of his hyperlipidemia. The patient is currently taking simvastatin. They deny any issues with myalgias or history of liver damage from it. They deny any focal numbness or weakness or chest pain.   Gluteal cleft cyst Patient has a spot in the center of his buttocks and the crack that he feels a sore and started developing to a cyst.  He has not noted any drainage but just that it is been more sore and painful over the past day and a half.  He denies any fevers or chills or pain or rashes anywhere else.  Relevant past medical, surgical, family and social history reviewed and updated as indicated. Interim medical history since our last visit reviewed. Allergies and medications reviewed and updated.  Review of Systems  Constitutional:  Negative for chills and fever.  Respiratory: Negative for shortness of breath and wheezing.   Cardiovascular: Negative for chest pain and leg swelling.  Gastrointestinal: Negative for abdominal pain.  Musculoskeletal: Negative for back pain and gait problem.  Skin: Positive for color change. Negative for rash.  All other systems reviewed and are negative.   Per HPI unless specifically indicated above   Allergies as of 08/13/2017      Reactions   Soap    White soaps      Medication List        Accurate as of 08/13/17  9:32 AM. Always use your most recent med list.          aspirin 81 MG EC tablet Commonly known as:  QC LO-DOSE ASPIRIN TAKE (1) TABLET BY MOUTH ONCE DAILY.   blood glucose meter kit and supplies Kit Dispense based on patient and insurance preference. Use up to four times daily as directed. (FOR ICD-9 250.00, 250.01).   BLOOD GLUCOSE TEST STRIPS Strp Use to test blood sugar 3 times a day as needed for signs/symptoms of hypoglycemia shakiness,sweating,blurred vision   buPROPion 150 MG 24 hr tablet Commonly known as:  WELLBUTRIN XL Take 150 mg by mouth every morning.   busPIRone 10 MG tablet Commonly known as:  BUSPAR Take 10 mg by mouth 2 (two) times daily.   CERAVE Crea APPLY 1 APPLICATION ONCE A DAY TO BOTH FEET   dextromethorphan-guaiFENesin 10-100 MG/5ML liquid Commonly known as:  MEDI-TUSSIN DM DIABETIC Take 5 mLs by mouth every 4 (four) hours as needed for cough.   divalproex 250 MG 24 hr tablet Commonly known as:  DEPAKOTE ER Take 250-1,000 mg by mouth 2 (two) times daily. 1 tablet in the morning and 4 tablets at bedtime   fluticasone 50 MCG/ACT nasal spray Commonly known as:  FLONASE Place 1 spray into both nostrils 2 (two) times daily as needed for allergies or rhinitis.   furosemide 20 MG tablet Commonly known as:  LASIX TAKE 1 TABLET BY MOUTH ONCE DAILY AS NEEDED FOR WEIGHT GAIN GREATER THAN 3 LBS   glipiZIDE 10 MG 24 hr  tablet Commonly known as:  GLUCOTROL XL TAKE (1) TABLET BY MOUTH ONCE DAILY.   Icosapent Ethyl 0.5 g Caps Commonly known as:  VASCEPA Take 2 capsules 2 (two) times daily by mouth.   Insulin Degludec-Liraglutide 100-3.6 UNIT-MG/ML Sopn Commonly known as:  XULTOPHY Inject 40 Units into the skin daily.   lisinopril 5 MG tablet Commonly known as:  PRINIVIL,ZESTRIL Take 5 mg by mouth daily.   metFORMIN 1000 MG tablet Commonly known as:  GLUCOPHAGE Take 1 tablet (1,000 mg total) by mouth 2 (two) times daily with a meal.   omeprazole 20 MG capsule Commonly known as:  PRILOSEC TAKE 1 CAPSULE BY MOUTH ONCE DAILY.   PARoxetine 20 MG tablet Commonly known as:  PAXIL Take 20 mg by mouth every morning.   Pen Needles 32G X 4 MM Misc 1 each by Does not apply route daily. Use to injection Xultophy daily   TRUEPLUS PEN NEEDLES 31G X 8 MM Misc Generic drug:  Insulin Pen Needle USE TO INJECT XULTOPHY DAILY   QUEtiapine 300 MG 24 hr tablet Commonly known as:  SEROQUEL XR Take 300 mg by mouth daily. Taken daily at [place] 0.5-1-0.5 % Soln Generic drug:  Carboxymeth-Glycerin-Polysorb Apply 1 drop to eye 4 (four) times daily.   simvastatin 40 MG tablet Commonly known as:  ZOCOR TAKE (1) TABLET BY MOUTH ONCE DAILY.   tamsulosin 0.4 MG Caps capsule Commonly known as:  FLOMAX TAKE 1 CAPSULE BY MOUTH ONCE A DAY.   triamcinolone ointment 0.5 % Commonly known as:  KENALOG APPLY TOPICALLY TWICE DAILY.   Vitamin D3 50000 units Tabs Take 50,000 Units by mouth once a week.          Objective:    BP 139/89   Pulse 87   Temp 99.3 F (37.4 C) (Oral)   Ht _0  (1.651 m)   Wt 221 lb 6.4 oz (100.4 kg)   BMI 36.84 kg/m   Wt Readings from Last 3 Encounters:  08/13/17 221 lb 6.4 oz (100.4 kg)  05/28/17 224 lb (101.6 kg)  05/07/17 218 lb 6 oz (99.1 kg)    Physical Exam  Constitutional: He is oriented to person, place, and time. He appears well-developed and  well-nourished. No distress.  Eyes: Pupils are equal, round, and reactive to light. Conjunctivae and EOM are normal. No scleral icterus.  Neck: Neck supple. No thyromegaly present.  Cardiovascular: Normal rate, regular rhythm, normal heart sounds and intact distal pulses.  No murmur heard. Pulmonary/Chest: Effort normal and breath sounds normal. No respiratory distress. He has no wheezes.  Musculoskeletal: Normal range of motion. He exhibits no edema.  Lymphadenopathy:    He has no cervical adenopathy.  Neurological: He is alert and oriented to person, place, and time. Coordination normal.  Skin: Skin is warm and dry. No  rash noted. He is not diaphoretic. There is erythema (Small palpable induration and central gluteal cleft, no drainage or induration noted.  Small amount of erythema overlying.).  Psychiatric: He has a normal mood and affect. His behavior is normal.  Nursing note and vitals reviewed.       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  Continue current medications for diabetes and hypertension and GERD and cholesterol.  We will recheck labs today.  Sent Keflex for small gluteal cleft cyst  that just starting to be infected  Follow up plan: Return in about 3 months (around 11/13/2017), or if symptoms worsen or fail to improve, for Recheck diabetes and hypertension and cholesterol.  Counseling provided for all of the vaccine components Orders Placed This Encounter  Procedures  . Bayer DCA Hb A1c Waived  . CMP14+EGFR  . CBC with Differential/Platelet  . Lipid panel    Caryl Pina, MD [facility] 08/13/2017, 9:32 AM

## 2017-08-13 NOTE — Patient Instructions (Signed)
If possible call in first for orders and do labs 1 week before office visit

## 2017-08-14 LAB — CBC WITH DIFFERENTIAL/PLATELET
BASOS: 0 %
Basophils Absolute: 0 10*3/uL (ref 0.0–0.2)
EOS (ABSOLUTE): 0.5 10*3/uL — ABNORMAL HIGH (ref 0.0–0.4)
Eos: 9 %
HEMOGLOBIN: 11.7 g/dL — AB (ref 13.0–17.7)
Hematocrit: 34.2 % — ABNORMAL LOW (ref 37.5–51.0)
IMMATURE GRANS (ABS): 0 10*3/uL (ref 0.0–0.1)
Immature Granulocytes: 1 %
LYMPHS: 32 %
Lymphocytes Absolute: 1.6 10*3/uL (ref 0.7–3.1)
MCH: 31.7 pg (ref 26.6–33.0)
MCHC: 34.2 g/dL (ref 31.5–35.7)
MCV: 93 fL (ref 79–97)
MONOCYTES: 12 %
Monocytes Absolute: 0.6 10*3/uL (ref 0.1–0.9)
NEUTROS ABS: 2.3 10*3/uL (ref 1.4–7.0)
Neutrophils: 46 %
Platelets: 219 10*3/uL (ref 150–379)
RBC: 3.69 x10E6/uL — ABNORMAL LOW (ref 4.14–5.80)
RDW: 14.5 % (ref 12.3–15.4)
WBC: 4.9 10*3/uL (ref 3.4–10.8)

## 2017-08-14 LAB — CMP14+EGFR
ALT: 12 IU/L (ref 0–44)
AST: 20 IU/L (ref 0–40)
Albumin/Globulin Ratio: 2 (ref 1.2–2.2)
Albumin: 3.9 g/dL (ref 3.5–5.5)
Alkaline Phosphatase: 79 IU/L (ref 39–117)
BUN/Creatinine Ratio: 14 (ref 9–20)
BUN: 25 mg/dL — AB (ref 6–24)
Bilirubin Total: <0.2 mg/dL (ref 0.0–1.2)
CALCIUM: 8.9 mg/dL (ref 8.7–10.2)
CO2: 23 mmol/L (ref 20–29)
Chloride: 102 mmol/L (ref 96–106)
Creatinine, Ser: 1.78 mg/dL — ABNORMAL HIGH (ref 0.76–1.27)
GFR, EST AFRICAN AMERICAN: 48 mL/min/{1.73_m2} — AB (ref >59)
GFR, EST NON AFRICAN AMERICAN: 41 mL/min/{1.73_m2} — AB (ref >59)
GLUCOSE: 143 mg/dL — AB (ref 65–99)
Globulin, Total: 2 g/dL (ref 1.5–4.5)
Potassium: 4.7 mmol/L (ref 3.5–5.2)
Sodium: 141 mmol/L (ref 134–144)
TOTAL PROTEIN: 5.9 g/dL — AB (ref 6.0–8.5)

## 2017-08-14 LAB — LIPID PANEL
CHOLESTEROL TOTAL: 145 mg/dL (ref 100–199)
Chol/HDL Ratio: 4 ratio (ref 0.0–5.0)
HDL: 36 mg/dL — ABNORMAL LOW (ref >39)
LDL CALC: 61 mg/dL (ref 0–99)
Triglycerides: 241 mg/dL — ABNORMAL HIGH (ref 0–149)
VLDL CHOLESTEROL CAL: 48 mg/dL — AB (ref 5–40)

## 2017-08-18 ENCOUNTER — Other Ambulatory Visit: Payer: Self-pay | Admitting: *Deleted

## 2017-08-18 ENCOUNTER — Other Ambulatory Visit: Payer: Medicare Other

## 2017-08-18 DIAGNOSIS — R944 Abnormal results of kidney function studies: Secondary | ICD-10-CM

## 2017-08-19 ENCOUNTER — Other Ambulatory Visit: Payer: Self-pay | Admitting: *Deleted

## 2017-08-19 LAB — CMP14+EGFR
A/G RATIO: 1.9 (ref 1.2–2.2)
ALK PHOS: 61 IU/L (ref 39–117)
ALT: 7 IU/L (ref 0–44)
AST: 10 IU/L (ref 0–40)
Albumin: 3.7 g/dL (ref 3.5–5.5)
BUN/Creatinine Ratio: 15 (ref 9–20)
BUN: 23 mg/dL (ref 6–24)
CALCIUM: 9.2 mg/dL (ref 8.7–10.2)
CHLORIDE: 104 mmol/L (ref 96–106)
CO2: 20 mmol/L (ref 20–29)
Creatinine, Ser: 1.54 mg/dL — ABNORMAL HIGH (ref 0.76–1.27)
GFR calc Af Amer: 57 mL/min/{1.73_m2} — ABNORMAL LOW (ref >59)
GFR, EST NON AFRICAN AMERICAN: 49 mL/min/{1.73_m2} — AB (ref >59)
GLOBULIN, TOTAL: 1.9 g/dL (ref 1.5–4.5)
Glucose: 214 mg/dL — ABNORMAL HIGH (ref 65–99)
POTASSIUM: 5.5 mmol/L — AB (ref 3.5–5.2)
SODIUM: 140 mmol/L (ref 134–144)
Total Protein: 5.6 g/dL — ABNORMAL LOW (ref 6.0–8.5)

## 2017-08-19 MED ORDER — SIMVASTATIN 40 MG PO TABS: ORAL_TABLET | ORAL | 1 refills | Status: DC

## 2017-08-20 ENCOUNTER — Other Ambulatory Visit: Payer: Self-pay | Admitting: Family Medicine

## 2017-09-01 DIAGNOSIS — F319 Bipolar disorder, unspecified: Secondary | ICD-10-CM | POA: Diagnosis not present

## 2017-09-02 ENCOUNTER — Other Ambulatory Visit: Payer: Medicare Other

## 2017-09-02 DIAGNOSIS — E875 Hyperkalemia: Secondary | ICD-10-CM

## 2017-09-03 LAB — CMP14+EGFR
ALBUMIN: 4.1 g/dL (ref 3.5–5.5)
ALT: 11 IU/L (ref 0–44)
AST: 16 IU/L (ref 0–40)
Albumin/Globulin Ratio: 1.9 (ref 1.2–2.2)
Alkaline Phosphatase: 61 IU/L (ref 39–117)
BUN/Creatinine Ratio: 16 (ref 9–20)
BUN: 27 mg/dL — AB (ref 6–24)
Bilirubin Total: <0.2 mg/dL (ref 0.0–1.2)
CALCIUM: 9.2 mg/dL (ref 8.7–10.2)
CHLORIDE: 101 mmol/L (ref 96–106)
CO2: 23 mmol/L (ref 20–29)
CREATININE: 1.68 mg/dL — AB (ref 0.76–1.27)
GFR, EST AFRICAN AMERICAN: 51 mL/min/{1.73_m2} — AB (ref >59)
GFR, EST NON AFRICAN AMERICAN: 44 mL/min/{1.73_m2} — AB (ref >59)
GLUCOSE: 162 mg/dL — AB (ref 65–99)
Globulin, Total: 2.2 g/dL (ref 1.5–4.5)
Potassium: 4.6 mmol/L (ref 3.5–5.2)
Sodium: 140 mmol/L (ref 134–144)
TOTAL PROTEIN: 6.3 g/dL (ref 6.0–8.5)

## 2017-09-09 ENCOUNTER — Other Ambulatory Visit: Payer: Medicare Other

## 2017-09-09 ENCOUNTER — Telehealth: Payer: Self-pay | Admitting: Family Medicine

## 2017-09-09 DIAGNOSIS — E559 Vitamin D deficiency, unspecified: Secondary | ICD-10-CM | POA: Diagnosis not present

## 2017-09-09 DIAGNOSIS — D509 Iron deficiency anemia, unspecified: Secondary | ICD-10-CM | POA: Diagnosis not present

## 2017-09-09 DIAGNOSIS — N183 Chronic kidney disease, stage 3 (moderate): Secondary | ICD-10-CM | POA: Diagnosis not present

## 2017-09-09 DIAGNOSIS — R809 Proteinuria, unspecified: Secondary | ICD-10-CM | POA: Diagnosis not present

## 2017-09-09 DIAGNOSIS — I10 Essential (primary) hypertension: Secondary | ICD-10-CM | POA: Diagnosis not present

## 2017-09-09 DIAGNOSIS — Z79899 Other long term (current) drug therapy: Secondary | ICD-10-CM | POA: Diagnosis not present

## 2017-09-09 NOTE — Telephone Encounter (Signed)
Caregiver notified of lab results. Sees Dr Lowanda Foster and has an appt with him Friday. Left copy of labs up front so caregiver could pick up to take to appt

## 2017-09-10 ENCOUNTER — Other Ambulatory Visit: Payer: Self-pay | Admitting: Family Medicine

## 2017-09-11 DIAGNOSIS — E1129 Type 2 diabetes mellitus with other diabetic kidney complication: Secondary | ICD-10-CM | POA: Diagnosis not present

## 2017-09-11 DIAGNOSIS — I10 Essential (primary) hypertension: Secondary | ICD-10-CM | POA: Diagnosis not present

## 2017-09-11 DIAGNOSIS — Z79899 Other long term (current) drug therapy: Secondary | ICD-10-CM | POA: Diagnosis not present

## 2017-09-11 DIAGNOSIS — R809 Proteinuria, unspecified: Secondary | ICD-10-CM | POA: Diagnosis not present

## 2017-09-11 DIAGNOSIS — N183 Chronic kidney disease, stage 3 (moderate): Secondary | ICD-10-CM | POA: Diagnosis not present

## 2017-09-15 DIAGNOSIS — B351 Tinea unguium: Secondary | ICD-10-CM | POA: Diagnosis not present

## 2017-09-15 DIAGNOSIS — E1142 Type 2 diabetes mellitus with diabetic polyneuropathy: Secondary | ICD-10-CM | POA: Diagnosis not present

## 2017-09-15 DIAGNOSIS — L851 Acquired keratosis [keratoderma] palmaris et plantaris: Secondary | ICD-10-CM | POA: Diagnosis not present

## 2017-09-22 ENCOUNTER — Other Ambulatory Visit: Payer: Self-pay | Admitting: Family Medicine

## 2017-09-29 ENCOUNTER — Other Ambulatory Visit: Payer: Medicare Other

## 2017-10-24 ENCOUNTER — Emergency Department (HOSPITAL_COMMUNITY)
Admission: EM | Admit: 2017-10-24 | Discharge: 2017-10-25 | Disposition: A | Discharge status: Home / Self Care | Payer: Medicare Other | Attending: Emergency Medicine | Admitting: Emergency Medicine

## 2017-10-24 ENCOUNTER — Other Ambulatory Visit: Payer: Self-pay

## 2017-10-24 ENCOUNTER — Encounter (HOSPITAL_COMMUNITY): Payer: Self-pay | Admitting: Emergency Medicine

## 2017-10-24 DIAGNOSIS — S0990XA Unspecified injury of head, initial encounter: Secondary | ICD-10-CM | POA: Diagnosis present

## 2017-10-24 DIAGNOSIS — F17210 Nicotine dependence, cigarettes, uncomplicated: Secondary | ICD-10-CM | POA: Diagnosis not present

## 2017-10-24 DIAGNOSIS — Z79899 Other long term (current) drug therapy: Secondary | ICD-10-CM | POA: Insufficient documentation

## 2017-10-24 DIAGNOSIS — I10 Essential (primary) hypertension: Secondary | ICD-10-CM | POA: Insufficient documentation

## 2017-10-24 DIAGNOSIS — R4689 Other symptoms and signs involving appearance and behavior: Secondary | ICD-10-CM

## 2017-10-24 DIAGNOSIS — S0081XA Abrasion of other part of head, initial encounter: Secondary | ICD-10-CM | POA: Insufficient documentation

## 2017-10-24 DIAGNOSIS — Z7982 Long term (current) use of aspirin: Secondary | ICD-10-CM | POA: Insufficient documentation

## 2017-10-24 DIAGNOSIS — Y92199 Unspecified place in other specified residential institution as the place of occurrence of the external cause: Secondary | ICD-10-CM | POA: Insufficient documentation

## 2017-10-24 DIAGNOSIS — F329 Major depressive disorder, single episode, unspecified: Secondary | ICD-10-CM | POA: Diagnosis not present

## 2017-10-24 DIAGNOSIS — Z794 Long term (current) use of insulin: Secondary | ICD-10-CM | POA: Diagnosis not present

## 2017-10-24 DIAGNOSIS — X79XXXA Intentional self-harm by blunt object, initial encounter: Secondary | ICD-10-CM | POA: Insufficient documentation

## 2017-10-24 DIAGNOSIS — E119 Type 2 diabetes mellitus without complications: Secondary | ICD-10-CM | POA: Insufficient documentation

## 2017-10-24 DIAGNOSIS — Y998 Other external cause status: Secondary | ICD-10-CM | POA: Diagnosis not present

## 2017-10-24 DIAGNOSIS — Y9389 Activity, other specified: Secondary | ICD-10-CM | POA: Insufficient documentation

## 2017-10-24 DIAGNOSIS — R456 Violent behavior: Secondary | ICD-10-CM | POA: Diagnosis not present

## 2017-10-24 DIAGNOSIS — F71 Moderate intellectual disabilities: Secondary | ICD-10-CM | POA: Insufficient documentation

## 2017-10-24 HISTORY — DX: Disorder of kidney and ureter, unspecified: N28.9

## 2017-10-24 LAB — ETHANOL

## 2017-10-24 LAB — RAPID URINE DRUG SCREEN, HOSP PERFORMED
AMPHETAMINES: NOT DETECTED
Benzodiazepines: NOT DETECTED
Cocaine: NOT DETECTED
Opiates: NOT DETECTED
TETRAHYDROCANNABINOL: NOT DETECTED

## 2017-10-24 LAB — COMPREHENSIVE METABOLIC PANEL
ALBUMIN: 3.9 g/dL (ref 3.5–5.0)
ALT: 11 U/L (ref 0–44)
AST: 16 U/L (ref 15–41)
Alkaline Phosphatase: 52 U/L (ref 38–126)
Anion gap: 8 (ref 5–15)
BUN: 31 mg/dL — AB (ref 6–20)
CHLORIDE: 105 mmol/L (ref 98–111)
CO2: 27 mmol/L (ref 22–32)
CREATININE: 1.86 mg/dL — AB (ref 0.61–1.24)
Calcium: 9.3 mg/dL (ref 8.9–10.3)
GFR calc Af Amer: 44 mL/min — ABNORMAL LOW (ref >60)
GFR calc non Af Amer: 38 mL/min — ABNORMAL LOW (ref >60)
GLUCOSE: 101 mg/dL — AB (ref 70–99)
POTASSIUM: 5.6 mmol/L — AB (ref 3.5–5.1)
SODIUM: 140 mmol/L (ref 135–145)
Total Bilirubin: 0.5 mg/dL (ref 0.3–1.2)
Total Protein: 7.4 g/dL (ref 6.5–8.1)

## 2017-10-24 LAB — CBC WITH DIFFERENTIAL/PLATELET
Basophils Absolute: 0 10*3/uL (ref 0.0–0.1)
Basophils Relative: 0 %
EOS ABS: 0.2 10*3/uL (ref 0.0–0.7)
Eosinophils Relative: 3 %
HCT: 37.1 % — ABNORMAL LOW (ref 39.0–52.0)
Hemoglobin: 12.7 g/dL — ABNORMAL LOW (ref 13.0–17.0)
LYMPHS ABS: 1.6 10*3/uL (ref 0.7–4.0)
Lymphocytes Relative: 18 %
MCH: 32 pg (ref 26.0–34.0)
MCHC: 34.2 g/dL (ref 30.0–36.0)
MCV: 93.5 fL (ref 78.0–100.0)
Monocytes Absolute: 0.5 10*3/uL (ref 0.1–1.0)
Monocytes Relative: 5 %
NEUTROS PCT: 74 %
Neutro Abs: 6.9 10*3/uL (ref 1.7–7.7)
PLATELETS: 230 10*3/uL (ref 150–400)
RBC: 3.97 MIL/uL — AB (ref 4.22–5.81)
RDW: 13 % (ref 11.5–15.5)
WBC: 9.2 10*3/uL (ref 4.0–10.5)

## 2017-10-24 LAB — CBG MONITORING, ED
GLUCOSE-CAPILLARY: 196 mg/dL — AB (ref 70–99)
Glucose-Capillary: 225 mg/dL — ABNORMAL HIGH (ref 70–99)

## 2017-10-24 MED ORDER — INSULIN DEGLUDEC-LIRAGLUTIDE 100-3.6 UNIT-MG/ML ~~LOC~~ SOPN: 35.0000 [IU] | PEN_INJECTOR | Freq: Every day | SUBCUTANEOUS | Status: DC

## 2017-10-24 MED ORDER — ASPIRIN EC 81 MG PO TBEC
81.0000 mg | DELAYED_RELEASE_TABLET | Freq: Once | ORAL | Status: AC
  Administered 2017-10-24: 81 mg via ORAL
  Filled 2017-10-24 (×2): qty 1

## 2017-10-24 MED ORDER — DOUBLE ANTIBIOTIC 500-10000 UNIT/GM EX OINT
TOPICAL_OINTMENT | CUTANEOUS | Status: AC
  Administered 2017-10-24: 1
  Filled 2017-10-24: qty 1

## 2017-10-24 MED ORDER — CARBOXYMETH-GLYCERIN-POLYSORB 0.5-1-0.5 % OP SOLN: 1.0000 [drp] | Freq: Four times a day (QID) | OPHTHALMIC | Status: DC

## 2017-10-24 MED ORDER — PAROXETINE HCL 20 MG PO TABS: 20.0000 mg | ORAL_TABLET | Freq: Every morning | ORAL | Status: DC

## 2017-10-24 MED ORDER — ICOSAPENT ETHYL 0.5 G PO CAPS
2.0000 | ORAL_CAPSULE | Freq: Two times a day (BID) | ORAL | Status: DC
  Filled 2017-10-24 (×4): qty 1

## 2017-10-24 MED ORDER — SIMVASTATIN 10 MG PO TABS
40.0000 mg | ORAL_TABLET | Freq: Every day | ORAL | Status: DC
  Administered 2017-10-24: 40 mg via ORAL
  Filled 2017-10-24 (×2): qty 4

## 2017-10-24 MED ORDER — POLYVINYL ALCOHOL 1.4 % OP SOLN
1.0000 [drp] | Freq: Four times a day (QID) | OPHTHALMIC | Status: DC
  Administered 2017-10-24: 1 [drp] via OPHTHALMIC
  Filled 2017-10-24 (×2): qty 15

## 2017-10-24 MED ORDER — GLIPIZIDE ER 10 MG PO TB24: 10.0000 mg | ORAL_TABLET | Freq: Every day | ORAL | Status: DC

## 2017-10-24 MED ORDER — QUETIAPINE FUMARATE ER 300 MG PO TB24: 300.0000 mg | ORAL_TABLET | Freq: Every day | ORAL | Status: DC

## 2017-10-24 MED ORDER — METFORMIN HCL 500 MG PO TABS
1000.0000 mg | ORAL_TABLET | Freq: Two times a day (BID) | ORAL | Status: DC
  Administered 2017-10-24 – 2017-10-25 (×2): 1000 mg via ORAL
  Filled 2017-10-24 (×2): qty 2

## 2017-10-24 MED ORDER — BUSPIRONE HCL 5 MG PO TABS
10.0000 mg | ORAL_TABLET | Freq: Two times a day (BID) | ORAL | Status: DC
  Administered 2017-10-24: 10 mg via ORAL
  Filled 2017-10-24 (×4): qty 1
  Filled 2017-10-24: qty 2

## 2017-10-24 MED ORDER — TAMSULOSIN HCL 0.4 MG PO CAPS
0.4000 mg | ORAL_CAPSULE | Freq: Every day | ORAL | Status: DC
  Administered 2017-10-24 – 2017-10-25 (×2): 0.4 mg via ORAL
  Filled 2017-10-24 (×2): qty 1

## 2017-10-24 MED ORDER — LISINOPRIL 5 MG PO TABS
5.0000 mg | ORAL_TABLET | Freq: Every day | ORAL | Status: DC
  Administered 2017-10-24 – 2017-10-25 (×2): 5 mg via ORAL
  Filled 2017-10-24 (×2): qty 1

## 2017-10-24 MED ORDER — BUPROPION HCL ER (XL) 150 MG PO TB24: 150.0000 mg | ORAL_TABLET | Freq: Every morning | ORAL | Status: DC

## 2017-10-24 MED ORDER — ACETAMINOPHEN 325 MG PO TABS
ORAL_TABLET | ORAL | Status: AC
  Administered 2017-10-24: 650 mg
  Filled 2017-10-24: qty 2

## 2017-10-24 MED ORDER — DIVALPROEX SODIUM ER 250 MG PO TB24
1000.0000 mg | ORAL_TABLET | Freq: Every day | ORAL | Status: DC
  Administered 2017-10-24: 1000 mg via ORAL
  Filled 2017-10-24 (×3): qty 4

## 2017-10-24 MED ORDER — PANTOPRAZOLE SODIUM 40 MG PO TBEC
40.0000 mg | DELAYED_RELEASE_TABLET | Freq: Every day | ORAL | Status: DC
  Administered 2017-10-24: 40 mg via ORAL
  Filled 2017-10-24 (×2): qty 1

## 2017-10-24 NOTE — ED Notes (Signed)
TTS in progress 
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
## 2017-10-24 NOTE — ED Notes (Signed)
Pt meal tray given 

## 2017-10-24 NOTE — ED Notes (Signed)
Pt is ward of DSS. Guardian is [employer]. She reports that he is dr Agapito Games at [facility]
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
## 2017-10-25 DIAGNOSIS — S0081XA Abrasion of other part of head, initial encounter: Secondary | ICD-10-CM | POA: Diagnosis not present

## 2017-10-25 LAB — CBG MONITORING, ED: GLUCOSE-CAPILLARY: 222 mg/dL — AB (ref 70–99)

## 2017-10-25 NOTE — Progress Notes (Signed)
Patient seen by me via tele-psych today and I have consulted with Dr. Dwyane Dee.  Patient denies any SI/HI/AVH and contracts for safety.  He states that he will return to the group home but still wants to move to a different one preferably in [place].  He states that he is seeing his psychiatrist Dr. Tamera Punt approximately 1 month ago.  I contacted patient's legal guardian which is DSS, and the employees name was [name].  Due to it being the weekend they were unable to get me to [facility] and connected me with the on-call LCSW.  The LCSW on call with DSS contacted his supervisor and they contact me back and said patient was good to discharge back to the group home and they had no issues or concerns with his discharge.  I contacted Dr. Lacinda Axon at [facility] ED and informed him of our recommendations.  Patient does not meet inpatient criteria and is psychiatrically cleared.

## 2017-10-25 NOTE — ED Notes (Signed)
Spoke with [facility]. Pt can be discharged back to group home

## 2017-10-25 NOTE — ED Notes (Signed)
[facility] in to transport

## 2017-10-25 NOTE — ED Notes (Signed)
[facility] notified and will pick pt up

## 2017-10-25 NOTE — ED Notes (Signed)
TTS  Reassessing pt at this time

## 2017-10-25 NOTE — Discharge Instructions (Addendum)
Behavioral health approved of discharge back to your facility.  Follow-up with your primary care doctor.

## 2017-11-10 ENCOUNTER — Other Ambulatory Visit: Payer: Self-pay | Admitting: Family Medicine

## 2017-11-24 DIAGNOSIS — B351 Tinea unguium: Secondary | ICD-10-CM | POA: Diagnosis not present

## 2017-11-24 DIAGNOSIS — L851 Acquired keratosis [keratoderma] palmaris et plantaris: Secondary | ICD-10-CM | POA: Diagnosis not present

## 2017-11-24 DIAGNOSIS — E1142 Type 2 diabetes mellitus with diabetic polyneuropathy: Secondary | ICD-10-CM | POA: Diagnosis not present

## 2017-12-01 DIAGNOSIS — F319 Bipolar disorder, unspecified: Secondary | ICD-10-CM | POA: Diagnosis not present

## 2017-12-03 ENCOUNTER — Other Ambulatory Visit: Payer: Self-pay | Admitting: Family Medicine

## 2017-12-03 ENCOUNTER — Encounter (HOSPITAL_COMMUNITY): Payer: Self-pay | Admitting: Emergency Medicine

## 2017-12-03 ENCOUNTER — Emergency Department (HOSPITAL_COMMUNITY)
Admission: EM | Admit: 2017-12-03 | Discharge: 2017-12-04 | Disposition: A | Discharge status: Nursing Home (Includes ICF) | Payer: Medicare Other | Attending: Emergency Medicine | Admitting: Emergency Medicine

## 2017-12-03 ENCOUNTER — Other Ambulatory Visit: Payer: Self-pay

## 2017-12-03 DIAGNOSIS — I10 Essential (primary) hypertension: Secondary | ICD-10-CM | POA: Insufficient documentation

## 2017-12-03 DIAGNOSIS — F17210 Nicotine dependence, cigarettes, uncomplicated: Secondary | ICD-10-CM | POA: Insufficient documentation

## 2017-12-03 DIAGNOSIS — F79 Unspecified intellectual disabilities: Secondary | ICD-10-CM | POA: Diagnosis not present

## 2017-12-03 DIAGNOSIS — R45851 Suicidal ideations: Secondary | ICD-10-CM | POA: Diagnosis not present

## 2017-12-03 DIAGNOSIS — E119 Type 2 diabetes mellitus without complications: Secondary | ICD-10-CM | POA: Insufficient documentation

## 2017-12-03 DIAGNOSIS — Z79899 Other long term (current) drug therapy: Secondary | ICD-10-CM | POA: Insufficient documentation

## 2017-12-03 DIAGNOSIS — R0902 Hypoxemia: Secondary | ICD-10-CM | POA: Diagnosis not present

## 2017-12-03 DIAGNOSIS — Z794 Long term (current) use of insulin: Secondary | ICD-10-CM | POA: Insufficient documentation

## 2017-12-03 DIAGNOSIS — Z7982 Long term (current) use of aspirin: Secondary | ICD-10-CM | POA: Insufficient documentation

## 2017-12-03 DIAGNOSIS — F329 Major depressive disorder, single episode, unspecified: Secondary | ICD-10-CM | POA: Insufficient documentation

## 2017-12-03 DIAGNOSIS — N179 Acute kidney failure, unspecified: Secondary | ICD-10-CM

## 2017-12-03 LAB — RAPID URINE DRUG SCREEN, HOSP PERFORMED
Amphetamines: NOT DETECTED
BARBITURATES: NOT DETECTED
BENZODIAZEPINES: NOT DETECTED
COCAINE: NOT DETECTED
Opiates: NOT DETECTED
TETRAHYDROCANNABINOL: NOT DETECTED

## 2017-12-03 LAB — COMPREHENSIVE METABOLIC PANEL
ALBUMIN: 4 g/dL (ref 3.5–5.0)
ALK PHOS: 52 U/L (ref 38–126)
ALT: 16 U/L (ref 0–44)
ANION GAP: 7 (ref 5–15)
AST: 19 U/L (ref 15–41)
BUN: 34 mg/dL — ABNORMAL HIGH (ref 6–20)
CALCIUM: 9.5 mg/dL (ref 8.9–10.3)
CO2: 28 mmol/L (ref 22–32)
Chloride: 107 mmol/L (ref 98–111)
Creatinine, Ser: 1.94 mg/dL — ABNORMAL HIGH (ref 0.61–1.24)
GFR calc Af Amer: 42 mL/min — ABNORMAL LOW (ref >60)
GFR calc non Af Amer: 36 mL/min — ABNORMAL LOW (ref >60)
GLUCOSE: 129 mg/dL — AB (ref 70–99)
POTASSIUM: 5.3 mmol/L — AB (ref 3.5–5.1)
SODIUM: 142 mmol/L (ref 135–145)
Total Bilirubin: 0.7 mg/dL (ref 0.3–1.2)
Total Protein: 7.1 g/dL (ref 6.5–8.1)

## 2017-12-03 LAB — ETHANOL: Alcohol, Ethyl (B): <10 mg/dL (ref <10)

## 2017-12-03 LAB — URINALYSIS, ROUTINE W REFLEX MICROSCOPIC
BILIRUBIN URINE: NEGATIVE
Bacteria, UA: NONE SEEN
Glucose, UA: NEGATIVE mg/dL
Hgb urine dipstick: NEGATIVE
Ketones, ur: NEGATIVE mg/dL
Leukocytes, UA: NEGATIVE
Nitrite: NEGATIVE
Protein, ur: >=300 mg/dL — AB
SPECIFIC GRAVITY, URINE: 1.019 (ref 1.005–1.030)
pH: 5 (ref 5.0–8.0)

## 2017-12-03 LAB — CBG MONITORING, ED
GLUCOSE-CAPILLARY: 214 mg/dL — AB (ref 70–99)
GLUCOSE-CAPILLARY: 132 mg/dL — AB (ref 70–99)
Glucose-Capillary: 268 mg/dL — ABNORMAL HIGH (ref 70–99)

## 2017-12-03 LAB — CBC
HEMATOCRIT: 36 % — AB (ref 39.0–52.0)
HEMOGLOBIN: 12.3 g/dL — AB (ref 13.0–17.0)
MCH: 31.9 pg (ref 26.0–34.0)
MCHC: 34.2 g/dL (ref 30.0–36.0)
MCV: 93.5 fL (ref 78.0–100.0)
Platelets: 249 10*3/uL (ref 150–400)
RBC: 3.85 MIL/uL — ABNORMAL LOW (ref 4.22–5.81)
RDW: 13.3 % (ref 11.5–15.5)
WBC: 6.9 10*3/uL (ref 4.0–10.5)

## 2017-12-03 LAB — SALICYLATE LEVEL: Salicylate Lvl: <7 mg/dL (ref 2.8–30.0)

## 2017-12-03 LAB — ACETAMINOPHEN LEVEL

## 2017-12-03 MED ORDER — ACETAMINOPHEN 325 MG PO TABS: 650.0000 mg | ORAL_TABLET | ORAL | Status: DC | PRN

## 2017-12-03 MED ORDER — ZOLPIDEM TARTRATE 5 MG PO TABS: 5.0000 mg | ORAL_TABLET | Freq: Every evening | ORAL | Status: DC | PRN

## 2017-12-03 MED ORDER — ONDANSETRON HCL 4 MG PO TABS: 4.0000 mg | ORAL_TABLET | Freq: Three times a day (TID) | ORAL | Status: DC | PRN

## 2017-12-03 MED ORDER — NICOTINE 21 MG/24HR TD PT24
21.0000 mg | MEDICATED_PATCH | Freq: Every day | TRANSDERMAL | Status: DC
  Filled 2017-12-03: qty 1

## 2017-12-03 MED ORDER — ALUM & MAG HYDROXIDE-SIMETH 200-200-20 MG/5ML PO SUSP: 30.0000 mL | Freq: Four times a day (QID) | ORAL | Status: DC | PRN

## 2017-12-03 NOTE — Progress Notes (Signed)
Pt meets inpatient criteria per [name], NP. Referral information has been sent to the following hospitals for review: [facility]   [name] will continue to assist with placement needs. Pt's legal guardian is [name] @ [facility] [phone]). This writer was unable to reach Ms. Price tonight. AM Disposition [name] will reach out tomorrow to notify her of disposition updates.   Audree Camel, LCSW, [place] Disposition [facility]/TTS [phone]
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
## 2017-12-03 NOTE — ED Notes (Signed)
Patient has had hourly rounding. Patient has been given snacks and is alert and oriented at this time.

## 2017-12-03 NOTE — ED Notes (Signed)
Patient given meal tray and diet-soda. Patient eating at this time. Sitter at bed-side.
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
## 2017-12-03 NOTE — ED Notes (Signed)
Monitor placed in the room for TTS consult.

## 2017-12-03 NOTE — ED Notes (Addendum)
Patient alert and was given a diet Ginger[place]ale. Patient comfortable at this time.

## 2017-12-03 NOTE — ED Notes (Signed)
TTS in room.  

## 2017-12-03 NOTE — ED Notes (Signed)
Patient given diet-soda and crackers as a snack.

## 2017-12-03 NOTE — BH Assessment (Signed)
Disposition:  Disposition Initial Assessment Completed for this Encounter: Yes  [name], NP, patient meets inpatient criteria. TTS to secure placement. Dr. Lacinda Axon and Hassan Rowan, Charge Nurse notified of disposition.
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
## 2017-12-04 DIAGNOSIS — F329 Major depressive disorder, single episode, unspecified: Secondary | ICD-10-CM | POA: Diagnosis not present

## 2017-12-04 LAB — CBG MONITORING, ED
GLUCOSE-CAPILLARY: 210 mg/dL — AB (ref 70–99)
Glucose-Capillary: 139 mg/dL — ABNORMAL HIGH (ref 70–99)

## 2017-12-04 MED ORDER — TAMSULOSIN HCL 0.4 MG PO CAPS
0.4000 mg | ORAL_CAPSULE | Freq: Every day | ORAL | Status: DC
  Administered 2017-12-04: 0.4 mg via ORAL
  Filled 2017-12-04: qty 1

## 2017-12-04 MED ORDER — VITAMIN D (ERGOCALCIFEROL) 1.25 MG (50000 UNIT) PO CAPS
50000.0000 [IU] | ORAL_CAPSULE | ORAL | Status: DC
  Filled 2017-12-04: qty 1

## 2017-12-04 MED ORDER — QUETIAPINE FUMARATE ER 300 MG PO TB24
300.0000 mg | ORAL_TABLET | Freq: Every day | ORAL | Status: DC
  Filled 2017-12-04 (×3): qty 1

## 2017-12-04 MED ORDER — SIMVASTATIN 10 MG PO TABS
40.0000 mg | ORAL_TABLET | Freq: Every day | ORAL | Status: DC
  Administered 2017-12-04: 40 mg via ORAL
  Filled 2017-12-04: qty 4

## 2017-12-04 MED ORDER — INSULIN DEGLUDEC-LIRAGLUTIDE 100-3.6 UNIT-MG/ML ~~LOC~~ SOPN: 40.0000 [IU] | PEN_INJECTOR | Freq: Every day | SUBCUTANEOUS | Status: DC

## 2017-12-04 MED ORDER — DIVALPROEX SODIUM ER 250 MG PO TB24
250.0000 mg | ORAL_TABLET | Freq: Every morning | ORAL | Status: DC
  Administered 2017-12-04: 250 mg via ORAL
  Filled 2017-12-04 (×4): qty 1

## 2017-12-04 MED ORDER — GLIPIZIDE ER 5 MG PO TB24
10.0000 mg | ORAL_TABLET | Freq: Every day | ORAL | Status: DC
  Administered 2017-12-04: 10 mg via ORAL
  Filled 2017-12-04 (×4): qty 1

## 2017-12-04 MED ORDER — ASPIRIN EC 81 MG PO TBEC
81.0000 mg | DELAYED_RELEASE_TABLET | Freq: Every day | ORAL | Status: DC
  Administered 2017-12-04: 81 mg via ORAL
  Filled 2017-12-04: qty 1

## 2017-12-04 MED ORDER — BUSPIRONE HCL 5 MG PO TABS
10.0000 mg | ORAL_TABLET | Freq: Two times a day (BID) | ORAL | Status: DC
  Administered 2017-12-04: 10 mg via ORAL
  Filled 2017-12-04: qty 2

## 2017-12-04 MED ORDER — OMEGA-3-ACID ETHYL ESTERS 1 G PO CAPS
2.0000 g | ORAL_CAPSULE | Freq: Two times a day (BID) | ORAL | Status: DC
  Administered 2017-12-04: 2 g via ORAL
  Filled 2017-12-04 (×7): qty 2

## 2017-12-04 MED ORDER — METFORMIN HCL 500 MG PO TABS
1000.0000 mg | ORAL_TABLET | Freq: Two times a day (BID) | ORAL | Status: DC
  Administered 2017-12-04: 1000 mg via ORAL
  Filled 2017-12-04: qty 2

## 2017-12-04 MED ORDER — LISINOPRIL 10 MG PO TABS
10.0000 mg | ORAL_TABLET | Freq: Every day | ORAL | Status: DC
  Administered 2017-12-04: 10 mg via ORAL
  Filled 2017-12-04: qty 1

## 2017-12-04 MED ORDER — FLUTICASONE PROPIONATE 50 MCG/ACT NA SUSP
1.0000 | Freq: Two times a day (BID) | NASAL | Status: DC | PRN
  Filled 2017-12-04: qty 16

## 2017-12-04 MED ORDER — PAROXETINE HCL 20 MG PO TABS
20.0000 mg | ORAL_TABLET | Freq: Every morning | ORAL | Status: DC
  Administered 2017-12-04: 20 mg via ORAL
  Filled 2017-12-04 (×5): qty 1

## 2017-12-04 MED ORDER — POLYVINYL ALCOHOL 1.4 % OP SOLN
1.0000 [drp] | Freq: Four times a day (QID) | OPHTHALMIC | Status: DC
  Administered 2017-12-04 (×2): 1 [drp] via OPHTHALMIC
  Filled 2017-12-04: qty 15

## 2017-12-04 MED ORDER — BUPROPION HCL ER (XL) 150 MG PO TB24
150.0000 mg | ORAL_TABLET | Freq: Every morning | ORAL | Status: DC
  Administered 2017-12-04: 150 mg via ORAL
  Filled 2017-12-04 (×5): qty 1

## 2017-12-04 MED ORDER — PANTOPRAZOLE SODIUM 40 MG PO TBEC
40.0000 mg | DELAYED_RELEASE_TABLET | Freq: Every day | ORAL | Status: DC
  Administered 2017-12-04: 40 mg via ORAL
  Filled 2017-12-04: qty 1

## 2017-12-04 MED ORDER — DIVALPROEX SODIUM ER 500 MG PO TB24: 1000.0000 mg | ORAL_TABLET | Freq: Every day | ORAL | Status: DC

## 2017-12-04 NOTE — ED Notes (Signed)
Pt received breakfast tray 

## 2017-12-04 NOTE — ED Notes (Signed)
Patient resting at this time.

## 2017-12-04 NOTE — Progress Notes (Signed)
[name] placed follow up calls to many of the [place] numbers, [phone], [phone], [phone] and [phone] but was unable to reach anyone, or leave voicemail messages.  [name] will continue to follow for discharge.  [facility] ED nurse, [facility], notified.  Areatha Keas. Judi Cong, MSW, [place] Disposition Clinical Social Work [phone] (cell) [phone] (office)

## 2017-12-04 NOTE — Progress Notes (Addendum)
Patient seen and does not meet criteria per Priscille Loveless, FNP.  Disposition [name] called and spoke to [place] Wilson, [facility] Manager, who referred me to Enrique Sack, [facility] Owner and Administrator [phone]).  Disposition [name] called and spoke to [place] in the main office, who noted that Ms. [facility] was not in the office, but that Peter [place] would arrange for someone to pick patient up.  [name] called and notified patient's nurse, Daphyne M, RN and [facility] EDP, Noemi Chapel, MD.  Areatha Keas. Judi Cong, MSW, [place] Disposition Clinical Social Work [phone] (cell) [phone] (office)

## 2017-12-04 NOTE — ED Notes (Signed)
Pt's belongings from psych lockers given to pt. Security called to return pt's wallet from lockbox.

## 2017-12-04 NOTE — ED Notes (Signed)
Patient offered Ambien PRN as ordered for sleep due to patient stated he was unable to sleep. Patient refused his Ambien.

## 2017-12-04 NOTE — ED Provider Notes (Addendum)
The pt medicadtion list has been reviewed by pharmacy - I have ordered home meds - pt is pending placement for [facility], that being said he has now stated that he feels better, he feels like he can work with the staff at the facility where he lives and can have open discussion with them, he has no longer suicidal and denies any suicidal thoughts at this time.  Psychiatry has cleared the patient for discharge.  Recommend no change in medications.   Noemi Chapel, MD 12/04/17 4270    Noemi Chapel, MD 12/04/17 1159

## 2017-12-04 NOTE — Progress Notes (Signed)
[name] called and left voice mail for patient's guardian, David Zamora , [place], asking for assistance in contacting group home to pick patient up.  Awaiting a return call.  Areatha Keas. Judi Cong, MSW, [place] Disposition Clinical Social Work [phone] (cell) [phone] (office)

## 2017-12-04 NOTE — ED Notes (Signed)
Patient given oral fluids as requested and snacks. Patient ate his dinner tray earlier. Patient has had bathroom breaks every hour. Patient alert and watching television at present with sitter in place.

## 2017-12-04 NOTE — ED Notes (Signed)
TTS in progress.  Pt hesitate to spoke with counselor but after asking pt to tell counselor what he wants, finally told told counselor he want to go back to [facility].

## 2017-12-04 NOTE — Progress Notes (Signed)
Disposition [name] continues to call all numbers that we have available for [facility].  Unable to reach anyone and I have not received a call back from any parties, including patient's DSS Guardian, [place].  Areatha Keas. Judi Cong, MSW, [place] Disposition Clinical Social Work [phone] (cell) [phone] (office)

## 2017-12-04 NOTE — BH Assessment (Signed)
Met with patient for reassessment. Pt stated he wants to return to group home. When asked why he changed his mind, pt stated he had not been taking his meds at the group home. Pt reports he feels safe returning to group home. He plans to ask director to help him talk with the staff person he hasn't been getting along with so they can get along better. Pt denies SI, HI & AVH. Priscille Loveless, NP joined session and recommends d/c home.

## 2017-12-04 NOTE — ED Notes (Signed)
BH called, pt can be discharged. Call transferred to Dr. Sabra Heck for update.

## 2017-12-04 NOTE — Discharge Instructions (Signed)
Please follow-up with your family doctor as needed.  You will need to have your kidney function rechecked in 2 weeks.  Drink plenty of clear liquids

## 2017-12-04 NOTE — ED Notes (Signed)
Pharmacy to review medication list.

## 2017-12-09 ENCOUNTER — Other Ambulatory Visit: Payer: Self-pay | Admitting: Family Medicine

## 2017-12-10 ENCOUNTER — Other Ambulatory Visit: Payer: Self-pay | Admitting: Family Medicine

## 2017-12-11 NOTE — Telephone Encounter (Signed)
Last seen 08/13/17.

## 2018-01-15 ENCOUNTER — Other Ambulatory Visit: Payer: Self-pay | Admitting: Family Medicine

## 2018-02-02 DIAGNOSIS — B351 Tinea unguium: Secondary | ICD-10-CM | POA: Diagnosis not present

## 2018-02-02 DIAGNOSIS — L851 Acquired keratosis [keratoderma] palmaris et plantaris: Secondary | ICD-10-CM | POA: Diagnosis not present

## 2018-02-02 DIAGNOSIS — E1142 Type 2 diabetes mellitus with diabetic polyneuropathy: Secondary | ICD-10-CM | POA: Diagnosis not present

## 2018-02-11 ENCOUNTER — Other Ambulatory Visit: Payer: Self-pay | Admitting: Family Medicine

## 2018-02-12 NOTE — Telephone Encounter (Signed)
Last seen 08/13/17.

## 2018-03-01 ENCOUNTER — Other Ambulatory Visit: Payer: Self-pay | Admitting: Family Medicine

## 2018-03-03 DIAGNOSIS — F319 Bipolar disorder, unspecified: Secondary | ICD-10-CM | POA: Diagnosis not present

## 2018-03-05 ENCOUNTER — Other Ambulatory Visit: Payer: Self-pay | Admitting: Family Medicine

## 2018-03-15 ENCOUNTER — Other Ambulatory Visit: Payer: Self-pay | Admitting: Family Medicine

## 2018-03-22 ENCOUNTER — Ambulatory Visit (INDEPENDENT_AMBULATORY_CARE_PROVIDER_SITE_OTHER): Payer: Medicare Other | Admitting: *Deleted

## 2018-03-22 ENCOUNTER — Other Ambulatory Visit: Payer: Medicare Other

## 2018-03-22 DIAGNOSIS — I10 Essential (primary) hypertension: Secondary | ICD-10-CM | POA: Diagnosis not present

## 2018-03-22 DIAGNOSIS — R809 Proteinuria, unspecified: Secondary | ICD-10-CM | POA: Diagnosis not present

## 2018-03-22 DIAGNOSIS — Z79899 Other long term (current) drug therapy: Secondary | ICD-10-CM | POA: Diagnosis not present

## 2018-03-22 DIAGNOSIS — E559 Vitamin D deficiency, unspecified: Secondary | ICD-10-CM | POA: Diagnosis not present

## 2018-03-22 DIAGNOSIS — Z23 Encounter for immunization: Secondary | ICD-10-CM | POA: Diagnosis not present

## 2018-03-22 DIAGNOSIS — N183 Chronic kidney disease, stage 3 (moderate): Secondary | ICD-10-CM | POA: Diagnosis not present

## 2018-03-22 DIAGNOSIS — D509 Iron deficiency anemia, unspecified: Secondary | ICD-10-CM | POA: Diagnosis not present

## 2018-03-24 DIAGNOSIS — D638 Anemia in other chronic diseases classified elsewhere: Secondary | ICD-10-CM | POA: Diagnosis not present

## 2018-03-24 DIAGNOSIS — R809 Proteinuria, unspecified: Secondary | ICD-10-CM | POA: Diagnosis not present

## 2018-03-24 DIAGNOSIS — N183 Chronic kidney disease, stage 3 (moderate): Secondary | ICD-10-CM | POA: Diagnosis not present

## 2018-03-24 DIAGNOSIS — E559 Vitamin D deficiency, unspecified: Secondary | ICD-10-CM | POA: Diagnosis not present

## 2018-04-01 ENCOUNTER — Other Ambulatory Visit: Payer: Self-pay | Admitting: Family Medicine

## 2018-04-01 NOTE — Telephone Encounter (Signed)
Last seen 08/13/17.

## 2018-04-02 ENCOUNTER — Other Ambulatory Visit: Payer: Self-pay | Admitting: Family Medicine

## 2018-04-09 ENCOUNTER — Other Ambulatory Visit: Payer: Self-pay | Admitting: Family Medicine

## 2018-04-09 NOTE — Telephone Encounter (Signed)
Last seen 08/13/17  Dr Dettinger

## 2018-04-14 ENCOUNTER — Other Ambulatory Visit: Payer: Self-pay | Admitting: Family Medicine

## 2018-04-15 NOTE — Telephone Encounter (Signed)
Have patient hold aspirin for now

## 2018-04-16 ENCOUNTER — Other Ambulatory Visit: Payer: Self-pay | Admitting: *Deleted

## 2018-04-16 DIAGNOSIS — Z9114 Patient's other noncompliance with medication regimen: Secondary | ICD-10-CM

## 2018-04-16 NOTE — Telephone Encounter (Signed)
Home caregiver aware and D/C note written

## 2018-05-06 ENCOUNTER — Encounter: Payer: Self-pay | Admitting: *Deleted

## 2018-05-06 ENCOUNTER — Ambulatory Visit (INDEPENDENT_AMBULATORY_CARE_PROVIDER_SITE_OTHER): Payer: Medicare Other | Admitting: *Deleted

## 2018-05-06 VITALS — BP 128/84 | HR 76 | Ht 65.0 in | Wt 221.0 lb

## 2018-05-06 DIAGNOSIS — Z0000 Encounter for general adult medical examination without abnormal findings: Secondary | ICD-10-CM | POA: Diagnosis not present

## 2018-05-06 DIAGNOSIS — Z23 Encounter for immunization: Secondary | ICD-10-CM | POA: Diagnosis not present
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
## 2018-05-06 NOTE — Patient Instructions (Addendum)
Please work on your goal:    Goals     . Quit Smoking     Pick a quit date and stop smoking after this date.      Please review the information given on Advance Directives.  If you complete the paperwork, please bring a copy to our office to be filed in your medical record.   Please follow up with Dr. Warrick Parisian as scheduled.    You got your 2nd pneumonia vaccine today.   Thank you for coming in for your Annual Wellness Visit today!!  Preventive Care 40-64 Years, Male Preventive care refers to lifestyle choices and visits with your health care provider that can promote health and wellness. What does preventive care include?   A yearly physical exam. This is also called an annual well check.  Dental exams once or twice a year.  Routine eye exams. Ask your health care provider how often you should have your eyes checked.  Personal lifestyle choices, including: ? Daily care of your teeth and gums. ? Regular physical activity. ? Eating a healthy diet. ? Avoiding tobacco and drug use. ? Limiting alcohol use. ? Practicing safe sex. ? Taking low-dose aspirin every day starting at age 46. What happens during an annual well check? The services and screenings done by your health care provider during your annual well check will depend on your age, overall health, lifestyle risk factors, and family history of disease. Counseling Your health care provider may ask you questions about your:  Alcohol use.  Tobacco use.  Drug use.  Emotional well-being.  Home and relationship well-being.  Sexual activity.  Eating habits.  Work and work Statistician. Screening You may have the following tests or measurements:  Height, weight, and BMI.  Blood pressure.  Lipid and cholesterol levels. These may be checked every 5 years, or more frequently if you are over 56 years old.  Skin check.  Lung cancer screening. You may have this screening every year starting at age 67 if you have  a 30-pack-year history of smoking and currently smoke or have quit within the past 15 years.  Colorectal cancer screening. All adults should have this screening starting at age 49 and continuing until age 84. Your health care provider may recommend screening at age 34. You will have tests every 1-10 years, depending on your results and the type of screening test. People at increased risk should start screening at an earlier age. Screening tests may include: ? Guaiac-based fecal occult blood testing. ? Fecal immunochemical test (FIT). ? Stool DNA test. ? Virtual colonoscopy. ? Sigmoidoscopy. During this test, a flexible tube with a tiny camera (sigmoidoscope) is used to examine your rectum and lower colon. The sigmoidoscope is inserted through your anus into your rectum and lower colon. ? Colonoscopy. During this test, a long, thin, flexible tube with a tiny camera (colonoscope) is used to examine your entire colon and rectum.  Prostate cancer screening. Recommendations will vary depending on your family history and other risks.  Hepatitis C blood test.  Hepatitis B blood test.  Sexually transmitted disease (STD) testing.  Diabetes screening. This is done by checking your blood sugar (glucose) after you have not eaten for a while (fasting). You may have this done every 1-3 years. Discuss your test results, treatment options, and if necessary, the need for more tests with your health care provider. Vaccines Your health care provider may recommend certain vaccines, such as:  Influenza vaccine. This is recommended every year.  Tetanus, diphtheria, and acellular pertussis (Tdap, Td) vaccine. You may need a Td booster every 10 years.  Varicella vaccine. You may need this if you have not been vaccinated.  Zoster vaccine. You may need this after age 66.  Measles, mumps, and rubella (MMR) vaccine. You may need at least one dose of MMR if you were born in 1957 or later. You may also need a  second dose.  Pneumococcal 13-valent conjugate (PCV13) vaccine. You may need this if you have certain conditions and have not been vaccinated.  Pneumococcal polysaccharide (PPSV23) vaccine. You may need one or two doses if you smoke cigarettes or if you have certain conditions.  Meningococcal vaccine. You may need this if you have certain conditions.  Hepatitis A vaccine. You may need this if you have certain conditions or if you travel or work in places where you may be exposed to hepatitis A.  Hepatitis B vaccine. You may need this if you have certain conditions or if you travel or work in places where you may be exposed to hepatitis B.  Haemophilus influenzae type b (Hib) vaccine. You may need this if you have certain risk factors. Talk to your health care provider about which screenings and vaccines you need and how often you need them. This information is not intended to replace advice given to you by your health care provider. Make sure you discuss any questions you have with your health care provider. Document Released: 04/27/2015 Document Revised: 05/21/2017 Document Reviewed: 01/30/2015 Elsevier Interactive Patient Education  2019 [employer].    Diabetes Mellitus and Nutrition, Adult When you have diabetes (diabetes mellitus), it is very important to have healthy eating habits because your blood sugar (glucose) levels are greatly affected by what you eat and drink. Eating healthy foods in the appropriate amounts, at about the same times every day, can help you:  Control your blood glucose.  Lower your risk of heart disease.  Improve your blood pressure.  Reach or maintain a healthy weight. Every person with diabetes is different, and each person has different needs for a meal plan. Your health care provider may recommend that you work with a diet and nutrition specialist (dietitian) to make a meal plan that is best for you. Your meal plan may vary depending on factors such  as:  The calories you need.  The medicines you take.  Your weight.  Your blood glucose, blood pressure, and cholesterol levels.  Your activity level.  Other health conditions you have, such as heart or kidney disease. How do carbohydrates affect me? Carbohydrates, also called carbs, affect your blood glucose level more than any other type of food. Eating carbs naturally raises the amount of glucose in your blood. Carb counting is a method for keeping track of how many carbs you eat. Counting carbs is important to keep your blood glucose at a healthy level, especially if you use insulin or take certain oral diabetes medicines. It is important to know how many carbs you can safely have in each meal. This is different for every person. Your dietitian can help you calculate how many carbs you should have at each meal and for each snack. Foods that contain carbs include:  Bread, cereal, rice, pasta, and crackers.  Potatoes and corn.  Peas, beans, and lentils.  Milk and yogurt.  Fruit and juice.  Desserts, such as cakes, cookies, ice cream, and candy. How does alcohol affect me? Alcohol can cause a sudden decrease in blood glucose (hypoglycemia),  especially if you use insulin or take certain oral diabetes medicines. Hypoglycemia can be a life-threatening condition. Symptoms of hypoglycemia (sleepiness, dizziness, and confusion) are similar to symptoms of having too much alcohol. If your health care provider says that alcohol is safe for you, follow these guidelines:  Limit alcohol intake to no more than 1 drink per day for nonpregnant women and 2 drinks per day for men. One drink equals 12 oz of beer, 5 oz of wine, or 1 oz of hard liquor.  Do not drink on an empty stomach.  Keep yourself hydrated with water, diet soda, or unsweetened iced tea.  Keep in mind that regular soda, juice, and other mixers may contain a lot of sugar and must be counted as carbs. What are tips for  following this plan?  Reading food labels  Start by checking the serving size on the "Nutrition Facts" label of packaged foods and drinks. The amount of calories, carbs, fats, and other nutrients listed on the label is based on one serving of the item. Many items contain more than one serving per package.  Check the total grams (g) of carbs in one serving. You can calculate the number of servings of carbs in one serving by dividing the total carbs by 15. For example, if a food has 30 g of total carbs, it would be equal to 2 servings of carbs.  Check the number of grams (g) of saturated and trans fats in one serving. Choose foods that have low or no amount of these fats.  Check the number of milligrams (mg) of salt (sodium) in one serving. Most people should limit total sodium intake to less than 2,300 mg per day.  Always check the nutrition information of foods labeled as "low-fat" or "nonfat". These foods may be higher in added sugar or refined carbs and should be avoided.  Talk to your dietitian to identify your daily goals for nutrients listed on the label. Shopping  Avoid buying canned, premade, or processed foods. These foods tend to be high in fat, sodium, and added sugar.  Shop around the outside edge of the grocery store. This includes fresh fruits and vegetables, bulk grains, fresh meats, and fresh dairy. Cooking  Use low-heat cooking methods, such as baking, instead of high-heat cooking methods like deep frying.  Cook using healthy oils, such as olive, canola, or sunflower oil.  Avoid cooking with butter, cream, or high-fat meats. Meal planning  Eat meals and snacks regularly, preferably at the same times every day. Avoid going long periods of time without eating.  Eat foods high in fiber, such as fresh fruits, vegetables, beans, and whole grains. Talk to your dietitian about how many servings of carbs you can eat at each meal.  Eat 4-6 ounces (oz) of lean protein each day,  such as lean meat, chicken, fish, eggs, or tofu. One oz of lean protein is equal to: ? 1 oz of meat, chicken, or fish. ? 1 egg. ?  cup of tofu.  Eat some foods each day that contain healthy fats, such as avocado, nuts, seeds, and fish. Lifestyle  Check your blood glucose regularly.  Exercise regularly as told by your health care provider. This may include: ? 150 minutes of moderate-intensity or vigorous-intensity exercise each week. This could be brisk walking, biking, or water aerobics. ? Stretching and doing strength exercises, such as yoga or weightlifting, at least 2 times a week.  Take medicines as told by your health care provider.  Do not use any products that contain nicotine or tobacco, such as cigarettes and e-cigarettes. If you need help quitting, ask your health care provider.  Work with a Social worker or diabetes educator to identify strategies to manage stress and any emotional and social challenges. Questions to ask a health care provider  Do I need to meet with a diabetes educator?  Do I need to meet with a dietitian?  What number can I call if I have questions?  When are the best times to check my blood glucose? Where to find more information:  American Diabetes Association: diabetes.org  Academy of Nutrition and Dietetics: www.eatright.[employer] of Diabetes and Digestive and Kidney Diseases (NIH): [employer] Summary  A healthy meal plan will help you control your blood glucose and maintain a healthy lifestyle.  Working with a diet and nutrition specialist (dietitian) can help you make a meal plan that is best for you.  Keep in mind that carbohydrates (carbs) and alcohol have immediate effects on your blood glucose levels. It is important to count carbs and to use alcohol carefully. This information is not intended to replace advice given to you by your health care provider. Make sure you discuss any questions you have with your health  care provider. Document Released: 12/26/2004 Document Revised: 10/29/2016 Document Reviewed: 05/05/2016 Elsevier Interactive Patient Education  2019 [employer].

## 2018-05-15 ENCOUNTER — Other Ambulatory Visit: Payer: Self-pay | Admitting: Family Medicine

## 2018-05-15 ENCOUNTER — Other Ambulatory Visit: Payer: Self-pay | Admitting: Family

## 2018-05-19 ENCOUNTER — Other Ambulatory Visit: Payer: Self-pay | Admitting: Family

## 2018-05-19 ENCOUNTER — Other Ambulatory Visit: Payer: Self-pay | Admitting: Family Medicine

## 2018-05-19 NOTE — Telephone Encounter (Signed)
08/13/17  Last seen

## 2018-05-19 NOTE — Telephone Encounter (Signed)
Last lipid 08/13/17

## 2018-05-21 ENCOUNTER — Other Ambulatory Visit: Payer: Self-pay

## 2018-05-21 ENCOUNTER — Encounter (HOSPITAL_COMMUNITY): Payer: Self-pay | Admitting: *Deleted

## 2018-05-21 ENCOUNTER — Emergency Department (HOSPITAL_COMMUNITY)
Admission: EM | Admit: 2018-05-21 | Discharge: 2018-05-22 | Disposition: A | Discharge status: Home / Self Care | Payer: Medicare Other | Attending: Emergency Medicine | Admitting: Emergency Medicine

## 2018-05-21 ENCOUNTER — Emergency Department (HOSPITAL_COMMUNITY): Payer: Medicare Other

## 2018-05-21 DIAGNOSIS — Z79899 Other long term (current) drug therapy: Secondary | ICD-10-CM | POA: Diagnosis not present

## 2018-05-21 DIAGNOSIS — E1165 Type 2 diabetes mellitus with hyperglycemia: Secondary | ICD-10-CM | POA: Diagnosis not present

## 2018-05-21 DIAGNOSIS — R451 Restlessness and agitation: Secondary | ICD-10-CM | POA: Diagnosis not present

## 2018-05-21 DIAGNOSIS — Z87891 Personal history of nicotine dependence: Secondary | ICD-10-CM | POA: Diagnosis not present

## 2018-05-21 DIAGNOSIS — F332 Major depressive disorder, recurrent severe without psychotic features: Secondary | ICD-10-CM | POA: Insufficient documentation

## 2018-05-21 DIAGNOSIS — R58 Hemorrhage, not elsewhere classified: Secondary | ICD-10-CM | POA: Diagnosis not present

## 2018-05-21 DIAGNOSIS — Z794 Long term (current) use of insulin: Secondary | ICD-10-CM | POA: Insufficient documentation

## 2018-05-21 DIAGNOSIS — S51811A Laceration without foreign body of right forearm, initial encounter: Secondary | ICD-10-CM | POA: Diagnosis not present

## 2018-05-21 DIAGNOSIS — R918 Other nonspecific abnormal finding of lung field: Secondary | ICD-10-CM | POA: Diagnosis not present

## 2018-05-21 DIAGNOSIS — R45851 Suicidal ideations: Secondary | ICD-10-CM | POA: Diagnosis not present

## 2018-05-21 DIAGNOSIS — I10 Essential (primary) hypertension: Secondary | ICD-10-CM | POA: Insufficient documentation

## 2018-05-21 DIAGNOSIS — R000 Tachycardia, unspecified: Secondary | ICD-10-CM | POA: Diagnosis not present

## 2018-05-21 DIAGNOSIS — F329 Major depressive disorder, single episode, unspecified: Secondary | ICD-10-CM | POA: Diagnosis present

## 2018-05-21 LAB — CBC
HCT: 35.3 % — ABNORMAL LOW (ref 39.0–52.0)
Hemoglobin: 11.3 g/dL — ABNORMAL LOW (ref 13.0–17.0)
MCH: 31 pg (ref 26.0–34.0)
MCHC: 32 g/dL (ref 30.0–36.0)
MCV: 96.7 fL (ref 80.0–100.0)
Platelets: 253 10*3/uL (ref 150–400)
RBC: 3.65 MIL/uL — ABNORMAL LOW (ref 4.22–5.81)
RDW: 12.6 % (ref 11.5–15.5)
WBC: 6.7 10*3/uL (ref 4.0–10.5)
nRBC: 0 % (ref 0.0–0.2)

## 2018-05-21 LAB — COMPREHENSIVE METABOLIC PANEL
ALT: 10 U/L (ref 0–44)
AST: 14 U/L — ABNORMAL LOW (ref 15–41)
Albumin: 3.6 g/dL (ref 3.5–5.0)
Alkaline Phosphatase: 53 U/L (ref 38–126)
Anion gap: 7 (ref 5–15)
BUN: 26 mg/dL — ABNORMAL HIGH (ref 6–20)
CO2: 24 mmol/L (ref 22–32)
Calcium: 8.9 mg/dL (ref 8.9–10.3)
Chloride: 105 mmol/L (ref 98–111)
Creatinine, Ser: 1.83 mg/dL — ABNORMAL HIGH (ref 0.61–1.24)
GFR calc Af Amer: 46 mL/min — ABNORMAL LOW (ref >60)
GFR calc non Af Amer: 40 mL/min — ABNORMAL LOW (ref >60)
Glucose, Bld: 175 mg/dL — ABNORMAL HIGH (ref 70–99)
Potassium: 5.1 mmol/L (ref 3.5–5.1)
SODIUM: 136 mmol/L (ref 135–145)
Total Bilirubin: 0.2 mg/dL — ABNORMAL LOW (ref 0.3–1.2)
Total Protein: 6.8 g/dL (ref 6.5–8.1)

## 2018-05-21 LAB — RAPID URINE DRUG SCREEN, HOSP PERFORMED
Amphetamines: NOT DETECTED
Barbiturates: NOT DETECTED
Benzodiazepines: NOT DETECTED
Cocaine: NOT DETECTED
OPIATES: NOT DETECTED
TETRAHYDROCANNABINOL: NOT DETECTED

## 2018-05-21 LAB — ETHANOL: Alcohol, Ethyl (B): <10 mg/dL (ref <10)
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
## 2018-05-21 NOTE — ED Notes (Signed)
Pt's belongings placed in psych lockers. Pt had a shirt, jeans, underwear, socks, tennis shoes. Pt also placed a black wallet with $1 cash in his jean pocket. Pt placed a yellow colored chain necklace and yellow colored cross pendant along with black colored ring in a clear bag and put in pt belongings bag in lockers.

## 2018-05-21 NOTE — ED Notes (Signed)
Pt changed into purple psych clothes and wanded by security again.

## 2018-05-21 NOTE — ED Notes (Signed)
[facility]:  Manuela Schwartz (staff): [phone]
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
## 2018-05-22 ENCOUNTER — Encounter (HOSPITAL_COMMUNITY): Payer: Self-pay | Admitting: Registered Nurse

## 2018-05-22 DIAGNOSIS — F332 Major depressive disorder, recurrent severe without psychotic features: Secondary | ICD-10-CM | POA: Diagnosis not present

## 2018-05-22 MED ORDER — DIVALPROEX SODIUM ER 250 MG PO TB24
250.0000 mg | ORAL_TABLET | Freq: Every morning | ORAL | Status: DC
  Administered 2018-05-22: 250 mg via ORAL
  Filled 2018-05-22 (×3): qty 1

## 2018-05-22 MED ORDER — LISINOPRIL 10 MG PO TABS
10.0000 mg | ORAL_TABLET | Freq: Every day | ORAL | Status: DC
  Administered 2018-05-22: 10 mg via ORAL
  Filled 2018-05-22: qty 1

## 2018-05-22 MED ORDER — QUETIAPINE FUMARATE ER 300 MG PO TB24
300.0000 mg | ORAL_TABLET | Freq: Every day | ORAL | Status: DC
  Filled 2018-05-22 (×2): qty 1

## 2018-05-22 MED ORDER — GLIPIZIDE ER 5 MG PO TB24
10.0000 mg | ORAL_TABLET | Freq: Every day | ORAL | Status: DC
  Filled 2018-05-22 (×2): qty 1

## 2018-05-22 MED ORDER — DIVALPROEX SODIUM ER 500 MG PO TB24: 1000.0000 mg | ORAL_TABLET | Freq: Every day | ORAL | Status: DC

## 2018-05-22 MED ORDER — PANTOPRAZOLE SODIUM 40 MG PO TBEC
40.0000 mg | DELAYED_RELEASE_TABLET | Freq: Every day | ORAL | Status: DC
  Administered 2018-05-22: 40 mg via ORAL
  Filled 2018-05-22: qty 1

## 2018-05-22 MED ORDER — OMEGA-3-ACID ETHYL ESTERS 1 G PO CAPS
2.0000 | ORAL_CAPSULE | Freq: Two times a day (BID) | ORAL | Status: DC
  Filled 2018-05-22 (×4): qty 2

## 2018-05-22 MED ORDER — SIMVASTATIN 10 MG PO TABS: 40.0000 mg | ORAL_TABLET | Freq: Every day | ORAL | Status: DC

## 2018-05-22 MED ORDER — BUSPIRONE HCL 5 MG PO TABS
10.0000 mg | ORAL_TABLET | Freq: Two times a day (BID) | ORAL | Status: DC
  Administered 2018-05-22: 10 mg via ORAL
  Filled 2018-05-22: qty 2

## 2018-05-22 MED ORDER — TAMSULOSIN HCL 0.4 MG PO CAPS
0.4000 mg | ORAL_CAPSULE | Freq: Every day | ORAL | Status: DC
  Administered 2018-05-22: 0.4 mg via ORAL
  Filled 2018-05-22: qty 1

## 2018-05-22 MED ORDER — BUPROPION HCL ER (XL) 150 MG PO TB24
150.0000 mg | ORAL_TABLET | Freq: Every morning | ORAL | Status: DC
  Administered 2018-05-22: 150 mg via ORAL
  Filled 2018-05-22 (×3): qty 1

## 2018-05-22 MED ORDER — INSULIN GLARGINE 100 UNIT/ML ~~LOC~~ SOLN
40.0000 [IU] | Freq: Every day | SUBCUTANEOUS | Status: DC
  Administered 2018-05-22: 40 [IU] via SUBCUTANEOUS
  Filled 2018-05-22 (×3): qty 0.4

## 2018-05-22 MED ORDER — INSULIN DEGLUDEC-LIRAGLUTIDE 100-3.6 UNIT-MG/ML ~~LOC~~ SOPN: 40.0000 [IU] | PEN_INJECTOR | Freq: Every day | SUBCUTANEOUS | Status: DC

## 2018-05-22 MED ORDER — METFORMIN HCL 500 MG PO TABS: 1000.0000 mg | ORAL_TABLET | Freq: Two times a day (BID) | ORAL | Status: DC

## 2018-05-22 NOTE — ED Notes (Signed)
Spoke with group home, states it will be about a hour before they can pick up pt

## 2018-05-22 NOTE — ED Notes (Signed)
Pt's arm dressings were falling off. Changed dressings on both arms. Both arms have a tefla dressing on them and are wrapped in gauze.

## 2018-05-22 NOTE — Discharge Instructions (Addendum)
Substance Abuse Treatment Programs ° °Intensive Outpatient Programs °[facility]     °[address]      °[place], [place]                   °[phone]      ° °[facility] °[address] °[place], [place] °[phone] ° °[facility]     °(Inpatient and outpatient)     °[address]           °[phone]   ° °[facility] °[phone] (Suboxone and Methadone) ° °[address]      °[place], [place] [address]      °[phone]      ° °[address] °[place], [place] °[phone] ° °[facility] (Outpatient/Inpatient, [facility])    °(insurance only) [phone]      °       °[facility] (Groups & Residential) °[place], [place] °[phone] ° °   °[facility]     °[address]     °[place], [place]      °[phone]      ° °[facility] (for caregivers and family) °[address] °[place], [place] °[phone] ° ° ° ° ° °Residential Treatment Programs °[facility]      °[address], [place], [place] [address]  °[phone]      ° °[facility] °[address] [place], [place] [address] °[phone] ° °[facility]        °[phone]      ° °[facility] °[phone] ° °[facility]             °[address]                                         °[place], [place]                                                °[phone] or [phone]                              ° °[facility] [place] °[address] °[place], [address] °[phone] ° °[facility]    °[address]      °[place], [place]     °[phone]      ° °[facility] °[address] °[place], [place] °[phone] ° °[facility]   °[address]     °[place], [place] [address]     °[phone]      °Admissions: 8am-3pm M-F ° °[facility] °[address] °[place],  [place] °[phone] ° °[facility]: Residential Program (90 Days)   °[place], [place]      °[phone] or [phone]    ° °[facility] °[place],  °(Walk in Hours over the weekend or by referral) ° °[facility] °[address], [place],  [address] °([phone]) [phone] ° °Crisis Mobile: [facility]:  1-877-626-1772 (for crisis response 24 hours a day) °[facility] Hotline:      1-800-256-2452 °Outpatient Psychiatry and Counseling ° °[facility]: Mobile Crisis   Management 24 hours:  1-579-867-5796  [facility] of the [employer] sliding scale fee and walk in schedule: M-F 8am-12pm/1pm-3pm [address]  [place], [place] [address] [place], [place] [address] [phone]  [facility] ([facility])- new patient walk-in appointments available Monday - Friday 8am -3pm.          [address] [place], [place] [address] [phone] or crisis line- [facility]/ Intensive Outpatient Therapy Program [place], [place] [address] [place]      [phone] [place], [place] [address]                 [place]   [facility] [phone] [place], [place] [address]   [facility]          [address] Johnette Abraham  [place], [place] [address]       [phone]  [facility] [address] [place], [place] [address] [phone]  [facility]    [address] [place], [place] [address]     [place], [place] Joycelyn Man     [place] [address]     [phone]       [facility] [place] [address]  [facility]     [address], [place], [place], [place] [address] [place]     [address]     [place], [place] [address]     [phone]       [facility] [address] [place], [place] [address] [phone] Resources for Temporary Residential Assistance/Crisis [facility]) M-F 8am-3pm   [address] [place], [place] [address]   [phone] Services include: laundry, barbering, support groups, case management, phone  & computer access, showers, AA/NA mtgs, mental health/substance abuse nurse, job skills class, disability information, VA assistance, spiritual classes, etc.   [facility]   [address], [place]     [place]              [employer] (women and children)       [place]. [place], [place] [address] [phone] [email or web address] for application and process Application Required  [facility]   [address]    [place] [address]     [phone]                    [place],  [address] [phone](OILNZVJK application appt.) Application Required  [facility] (women only)    [address]     [place],  [address]     [phone]  Intake starts 6pm daily Need valid ID, SSC, & Police report [employer] [address] [place], [place] [phone] Application Required  [employer] (men only)     [place].      [place], [place]       [place] (Pregnant women only) [address] [place], [place]  [facility]      [place] [address]      [place], [place] [address]     [phone]             [facility] [address] [place], [place] 90 day commitment/SA/Application process  [facility](men only)     [address]     [place], [place]       Check-in at [facility] [address] [place], [place] [address] [phone] Men/Women/Women and Children must be there by 7 pm  [place], [place]                  Take your usual prescriptions as previously directed.  Call your regular medical doctor and your mental health provider on Monday to schedule a follow up appointment within the next week.  Return to the Emergency Department immediately sooner if worsening.

## 2018-05-22 NOTE — ED Provider Notes (Signed)
Psych team has re-evaluated pt today: pt denies SI/HI/AVH, pt does not meet inpt criteria and can be d/c back to group home with oupt resources. Will d/c stable.    Francine Graven, DO 05/22/18 1337

## 2018-05-22 NOTE — Consult Note (Signed)
  Tele Assessment   David Zamora, 58 y.o., male patient presented to [facility] after an incident in group home; patient angry; broke window and started cutting arm with glass.  Patient seen via telepsych by this provider; chart reviewed and consulted with Dr. Dwyane Dee on 05/22/18.  On evaluation David Zamora reports that he does not want to kill himself.  States that he is in the hospital because they was trying to make him do something he didn't want to.  "I broke the window.  Not both of them; just one and I cut my arm just a little bit."  Patient denies suicidal/self-harm/homicidal ideation, psychosis, and paranoia.  States that he knew breaking the window was wrong but he was mad.  Discussed thing he could do when mad next time; like hitting the bed mattress instead of walls and windows; states he'll hit the mattress next time.  Patient states that he is eating/sleeping without difficulty, and tolerating medications without adverse reaction.    During evaluation David Zamora is laying on his side in bed; eating lunch; he is alert/oriented x 4; calm/cooperative; and mood congruent with affect.  Patient is speaking in a clear tone at moderate volume, and normal pace; with good eye contact.  His thought process is coherent and relevant; There is no indication that he is currently responding to internal/external stimuli or experiencing delusional thought content.  Patient denies suicidal/self-harm/homicidal ideation, psychosis, and paranoia.  Patient has remained calm throughout assessment and has answered questions appropriately.     Recommendations:  Follow up with current outpatient psychiatric provider  Disposition:  Patient psychiatrically cleared No evidence of imminent risk to self or others at present.   Patient does not meet criteria for psychiatric inpatient admission. Supportive therapy provided about ongoing stressors. Discussed crisis plan, support from social network, calling 911, coming  to the Emergency Department, and calling Suicide Hotline.   Spoke with Dr. Thurnell Garbe; informed of above recommendation and disposition   Earleen Newport, NP

## 2018-05-26 ENCOUNTER — Encounter: Payer: Self-pay | Admitting: Family Medicine

## 2018-05-26 ENCOUNTER — Ambulatory Visit (INDEPENDENT_AMBULATORY_CARE_PROVIDER_SITE_OTHER): Payer: Medicare Other | Admitting: Family Medicine

## 2018-05-26 VITALS — BP 147/92 | HR 96 | Temp 98.9°F | Ht 65.0 in | Wt 221.6 lb

## 2018-05-26 DIAGNOSIS — I10 Essential (primary) hypertension: Secondary | ICD-10-CM

## 2018-05-26 DIAGNOSIS — E1169 Type 2 diabetes mellitus with other specified complication: Secondary | ICD-10-CM

## 2018-05-26 DIAGNOSIS — N183 Chronic kidney disease, stage 3 unspecified: Secondary | ICD-10-CM

## 2018-05-26 DIAGNOSIS — E1159 Type 2 diabetes mellitus with other circulatory complications: Secondary | ICD-10-CM

## 2018-05-26 DIAGNOSIS — K219 Gastro-esophageal reflux disease without esophagitis: Secondary | ICD-10-CM

## 2018-05-26 DIAGNOSIS — E1122 Type 2 diabetes mellitus with diabetic chronic kidney disease: Secondary | ICD-10-CM | POA: Diagnosis not present

## 2018-05-26 DIAGNOSIS — I152 Hypertension secondary to endocrine disorders: Secondary | ICD-10-CM

## 2018-05-26 DIAGNOSIS — Z114 Encounter for screening for human immunodeficiency virus [HIV]: Secondary | ICD-10-CM | POA: Diagnosis not present

## 2018-05-26 DIAGNOSIS — E781 Pure hyperglyceridemia: Secondary | ICD-10-CM

## 2018-05-26 LAB — BAYER DCA HB A1C WAIVED: HB A1C: 7.6 % — AB (ref <7.0)

## 2018-05-26 NOTE — Progress Notes (Signed)
BP (!) 147/92   Pulse 96   Temp 98.9 F (37.2 C) (Oral)   Ht '5\' 5"'  (1.651 m)   Wt 221 lb 9.6 oz (100.5 kg)   BMI 36.88 kg/m    Subjective:    Patient ID: David Zamora, male    DOB: June 26, 1960, 58 y.o.   MRN: [id number]  HPI: David Zamora is a 58 y.o. male presenting on 05/26/2018 for Hypertension (check up of chronic medical conditions) and Diabetes   HPI Type 2 diabetes mellitus Patient comes in today for recheck of his diabetes. Patient has been currently taking metformin and Xultophy and glipizide. Patient is currently on an ACE inhibitor/ARB. Patient has not seen an ophthalmologist this year. Patient denies any issues with their feet.  Patient has stage III CKD and we are monitoring this for now, he does have a nephrologist as well.  Hypertension Patient is currently on lisinopril, and their blood pressure today is 147/92. Patient denies any lightheadedness or dizziness. Patient denies headaches, blurred vision, chest pains, shortness of breath, or weakness. Denies any side effects from medication and is content with current medication.   GERD Patient is currently on omeprazole.  She denies any major symptoms or abdominal pain or belching or burping. She denies any blood in her stool or lightheadedness or dizziness.   Relevant past medical, surgical, family and social history reviewed and updated as indicated. Interim medical history since our last visit reviewed. Allergies and medications reviewed and updated.  Review of Systems  Constitutional: Negative for chills and fever.  Respiratory: Negative for shortness of breath and wheezing.   Cardiovascular: Negative for chest pain and leg swelling.  Musculoskeletal: Negative for back pain and gait problem.  Skin: Negative for rash.  Neurological: Negative for dizziness, weakness, light-headedness and numbness.  All other systems reviewed and are negative.   Per HPI unless specifically indicated above   Allergies as of  05/26/2018      Reactions   Soap    White soaps      Medication List       Accurate as of May 26, 2018 11:19 AM. Always use your most recent med list.        blood glucose meter kit and supplies Kit Dispense based on patient and insurance preference. Use up to four times daily as directed. (FOR ICD-9 250.00, 250.01).   buPROPion 150 MG 24 hr tablet Commonly known as:  WELLBUTRIN XL Take 150 mg by mouth every morning.   busPIRone 10 MG tablet Commonly known as:  BUSPAR Take 10 mg by mouth 2 (two) times daily.   CERAVE [place] APPLY 1 APPLICATION ONCE A DAY TO BOTH FEET   divalproex 250 MG 24 hr tablet Commonly known as:  DEPAKOTE ER Take 250-1,000 mg by mouth See admin instructions. 239m in the morning and 10085m at bedtime   glipiZIDE 10 MG 24 hr tablet Commonly known as:  GLUCOTROL XL TAKE (1) TABLET BY MOUTH ONCE DAILY.   Insulin Degludec-Liraglutide 100-3.6 UNIT-MG/ML Sopn Commonly known as:  XULTOPHY Inject 40 Units into the skin daily.   lisinopril 10 MG tablet Commonly known as:  PRINIVIL,ZESTRIL Take 10 mg by mouth daily.   metFORMIN 1000 MG tablet Commonly known as:  GLUCOPHAGE TAKE 1 TABLET BY MOUTH TWICE DAILY WITH MEALS.   omeprazole 20 MG capsule Commonly known as:  PRILOSEC TAKE 1 CAPSULE BY MOUTH ONCE DAILY.   PARoxetine 20 MG tablet Commonly known as:  PAXIL Take  20 mg by mouth every morning.   QUEtiapine 300 MG 24 hr tablet Commonly known as:  SEROQUEL XR Take 300 mg by mouth daily. Taken daily at [place] 0.5-1-0.5 % Soln Generic drug:  Carboxymeth-Glycerin-Polysorb Apply 1 drop to eye 4 (four) times daily.   simvastatin 40 MG tablet Commonly known as:  ZOCOR TAKE (1) TABLET BY MOUTH ONCE DAILY.   tamsulosin 0.4 MG Caps capsule Commonly known as:  FLOMAX TAKE 1 CAPSULE BY MOUTH ONCE A DAY.   triamcinolone ointment 0.5 % Commonly known as:  KENALOG APPLY TOPICALLY TWICE DAILY.   VASCEPA 1 g  Caps Generic drug:  Icosapent Ethyl TAKE (2) CAPSULES BY MOUTH TWICE DAILY.   Vitamin D3 1.25 MG (50000 UT) Tabs Take 50,000 Units by mouth every Saturday.          Objective:    BP (!) 147/92   Pulse 96   Temp 98.9 F (37.2 C) (Oral)   Ht '5\' 5"'  (1.651 m)   Wt 221 lb 9.6 oz (100.5 kg)   BMI 36.88 kg/m   Wt Readings from Last 3 Encounters:  05/26/18 221 lb 9.6 oz (100.5 kg)  05/21/18 219 lb 7 oz (99.5 kg)  05/06/18 221 lb (100.2 kg)    Physical Exam Vitals signs and nursing note reviewed.  Constitutional:      General: He is not in acute distress.    Appearance: He is well-developed. He is not diaphoretic.  Eyes:     General: No scleral icterus.    Conjunctiva/sclera: Conjunctivae normal.  Neck:     Musculoskeletal: Neck supple.     Thyroid: No thyromegaly.  Cardiovascular:     Rate and Rhythm: Normal rate and regular rhythm.     Heart sounds: Normal heart sounds. No murmur.  Pulmonary:     Effort: Pulmonary effort is normal. No respiratory distress.     Breath sounds: Normal breath sounds. No wheezing.  Musculoskeletal: Normal range of motion.  Lymphadenopathy:     Cervical: No cervical adenopathy.  Skin:    General: Skin is warm and dry.     Findings: No rash.  Neurological:     Mental Status: He is alert and oriented to person, place, and time.     Coordination: Coordination normal.  Psychiatric:        Behavior: Behavior normal.         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 Follow up plan: Return in about 3 months (around 08/24/2018), or if symptoms worsen or fail to improve, for Diabetes recheck.  Counseling provided for all of  the vaccine components Orders Placed This Encounter  Procedures  . CMP14+EGFR  . Bayer DCA Hb A1c Waived  . HIV Antibody (routine testing w rflx)    Caryl Pina, MD [place] 05/26/2018, 11:19 AM

## 2018-05-27 LAB — CMP14+EGFR
ALT: 7 IU/L (ref 0–44)
AST: 12 IU/L (ref 0–40)
Albumin/Globulin Ratio: 1.9 (ref 1.2–2.2)
Albumin: 4.2 g/dL (ref 3.8–4.9)
Alkaline Phosphatase: 64 IU/L (ref 39–117)
BUN / CREAT RATIO: 14 (ref 9–20)
BUN: 23 mg/dL (ref 6–24)
Bilirubin Total: <0.2 mg/dL (ref 0.0–1.2)
CO2: 21 mmol/L (ref 20–29)
Calcium: 9.5 mg/dL (ref 8.7–10.2)
Chloride: 102 mmol/L (ref 96–106)
Creatinine, Ser: 1.64 mg/dL — ABNORMAL HIGH (ref 0.76–1.27)
GFR calc non Af Amer: 45 mL/min/{1.73_m2} — ABNORMAL LOW (ref >59)
GFR, EST AFRICAN AMERICAN: 52 mL/min/{1.73_m2} — AB (ref >59)
Globulin, Total: 2.2 g/dL (ref 1.5–4.5)
Glucose: 125 mg/dL — ABNORMAL HIGH (ref 65–99)
Potassium: 5.5 mmol/L — ABNORMAL HIGH (ref 3.5–5.2)
Sodium: 140 mmol/L (ref 134–144)
Total Protein: 6.4 g/dL (ref 6.0–8.5)

## 2018-05-27 LAB — HIV ANTIBODY (ROUTINE TESTING W REFLEX): HIV Screen 4th Generation wRfx: NONREACTIVE

## 2018-06-01 DIAGNOSIS — F319 Bipolar disorder, unspecified: Secondary | ICD-10-CM | POA: Diagnosis not present

## 2018-06-15 ENCOUNTER — Other Ambulatory Visit: Payer: Self-pay | Admitting: Family Medicine

## 2018-06-15 DIAGNOSIS — F79 Unspecified intellectual disabilities: Secondary | ICD-10-CM

## 2018-06-15 DIAGNOSIS — F3341 Major depressive disorder, recurrent, in partial remission: Secondary | ICD-10-CM

## 2018-06-15 DIAGNOSIS — R454 Irritability and anger: Secondary | ICD-10-CM

## 2018-06-15 NOTE — Progress Notes (Signed)
Placed referral for DME for anger issues

## 2018-06-16 ENCOUNTER — Other Ambulatory Visit: Payer: Self-pay | Admitting: Family Medicine

## 2018-06-22 ENCOUNTER — Ambulatory Visit (INDEPENDENT_AMBULATORY_CARE_PROVIDER_SITE_OTHER): Payer: Medicare Other | Admitting: Licensed Clinical Social Worker

## 2018-06-22 DIAGNOSIS — E1159 Type 2 diabetes mellitus with other circulatory complications: Secondary | ICD-10-CM | POA: Diagnosis not present

## 2018-06-22 DIAGNOSIS — K219 Gastro-esophageal reflux disease without esophagitis: Secondary | ICD-10-CM

## 2018-06-22 DIAGNOSIS — F3341 Major depressive disorder, recurrent, in partial remission: Secondary | ICD-10-CM | POA: Diagnosis not present

## 2018-06-22 DIAGNOSIS — E1169 Type 2 diabetes mellitus with other specified complication: Secondary | ICD-10-CM | POA: Diagnosis not present

## 2018-06-22 DIAGNOSIS — E781 Pure hyperglyceridemia: Secondary | ICD-10-CM | POA: Diagnosis not present

## 2018-06-22 DIAGNOSIS — I152 Hypertension secondary to endocrine disorders: Secondary | ICD-10-CM

## 2018-06-22 DIAGNOSIS — F79 Unspecified intellectual disabilities: Secondary | ICD-10-CM

## 2018-06-22 DIAGNOSIS — I10 Essential (primary) hypertension: Secondary | ICD-10-CM | POA: Diagnosis not present

## 2018-06-22 NOTE — Chronic Care Management (AMB) (Signed)
Chronic Care Management    Clinical Social Work General Note  06/22/2018 Name: David Zamora MRN: [id number] DOB: [birth year]  David Zamora is a 58 y.o. year old male who is a primary care patient of Dettinger, Fransisca Kaufmann, MD. The CCM team was consulted to assist the patient with psychosocial assessment and counseling support.   [name] was given information about Chronic Care Management services today including:  1. CCM service includes personalized support from designated clinical staff supervised by his physician, including individualized plan of care and coordination with other care providers 2. 24/7 contact phone numbers for assistance for urgent and routine care needs. 3. Service will only be billed when office clinical staff spend 20 minutes or more in a month to coordinate care. 4. Only one practitioner may furnish and bill the service in a calendar month. 5. The patient may stop CCM services at any time (effective at the end of the month) by phone call to the office staff. 6. The patient will be responsible for cost sharing (co-pay) of up to 20% of the service fee (after annual deductible is met).  Patient agreed to services and verbal consent obtained.   Review of patient status, including review of consultants reports, relevant laboratory and other test results, and collaboration with appropriate care team members and the patient's provider was performed as part of comprehensive patient evaluation and provision of chronic care management services.    SDOH (Social Determinants of Health) Client resides at [facility] in [place], [place]. Has daily support from CNA . Has meals prepared for him daily and has assistance in getting medications as prescribed.  Medium Risk for tobacco use: At risk for Intimate Partner violence. Recent Suicide Attempt ( cutting self with glass; cleared to return to [name]. )  Goals    . Client will talk with LCSW about feelings of  anger at being at [name] (pt-stated)     Current Barriers:  [name] Kitchen Mental Health Concerns  . Social Isolation  . Self Harm History  Clinical Social Work Clinical Goal(s):  [name] Kitchen Over the next 30 days, client will work with SW to address concerns related to client's anger at being placed at [facility]  Interventions: . Patient interviewed and appropriate assessments performed . Provided patient with information about CCM program resources  . Talked with client about his feelings of anger at being placed at [facility] . Talked with client about strategies for managing anger outbursts of client (hitting pillow when angry, hitting soft mattress when angry, spending time alone)  Patient Self Care Activities:  . Attends all scheduled provider appointments . Attends church or other social activities  Plan:  . Patient will call LCSW to discuss client feelings of anger and managing feelings of anger  . Patient will use strategies discussed to help him manage anger outbursts . Patient will talk with his PCP regarding anger outbursts of client . Patient will attend appointments as scheduled with psychiatrist. . Patient will communicate needs to care provider staff at [facility] . LCSW to call client in 1 week to discuss client management of anger issues regarding current placement .  Initial goal documentation    .            Follow Up Plan: LCSW to call client in 1 week to discuss client management of anger issues regarding current client placement      David Zamora.Franchelle Foskett MSW, LCSW Licensed Clinical  Social Worker [facility] Care Management [phone]

## 2018-06-22 NOTE — Patient Instructions (Signed)
Licensed Clinical Social Worker Visit Information  Goals we discussed today:  Goals Addressed            This Visit's Progress   . Client will talk with LCSW about feelings of anger at being at [name] (pt-stated)       Current Barriers:  [name] Kitchen Mental Health Concerns  . Social Isolation  . Self Harm History  Clinical Social Work Clinical Goal(s):  [name] Kitchen Over the next 30 days, client will work with SW to address concerns related to client's anger at being placed at [facility]  Interventions: . Patient interviewed and appropriate assessments performed . Provided patient with information about CCM program resources  . Talked with client about his feelings of anger at being placed at [facility] . Talked with client about strategies for managing anger outbursts of client  Patient Self Care Activities:  . Attends all scheduled provider appointments . Attends church or other social activities  Plan:  . Patient will call LCSW to discuss client feelings of anger and managing feelings of anger  . Patient will use strategies discussed to help him manage anger outbursts . Patient will talk with his PCP regarding anger outbursts of client . Patient will attend appointments as scheduled with psychiatrist. . Patient will communicate needs to care provider staff at [facility] . LCSW to call client in 1 week to discuss client management of anger issues regarding current placment  Initial goal documentation       Materials provided: No  Mr. Macneal was given information about Chronic Care Management services today including:  1. CCM service includes personalized support from designated clinical staff supervised by his physician, including individualized plan of care and coordination with other care providers 2. 24/7 contact phone numbers for assistance for urgent and routine care needs. 3. Service will only be billed when office clinical staff spend 20 minutes or  more in a month to coordinate care. 4. Only one practitioner may furnish and bill the service in a calendar month. 5. The patient may stop CCM services at any time (effective at the end of the month) by phone call to the office staff. 6. The patient will be responsible for cost sharing (co-pay) of up to 20% of the service fee (after annual deductible is met).  Patient agreed to services and verbal consent obtained.    Follow Up Plan:  LCSW to call client in 1 week to talk with client about managing anger issues regarding current placement  The patient verbalized understanding of instructions provided today and declined a print copy of patient instruction materials.   [name].Abreanna Drawdy MSW, LCSW Licensed Clinical Social Worker [facility] Care Management [phone]

## 2018-06-29 ENCOUNTER — Other Ambulatory Visit: Payer: Self-pay | Admitting: Family Medicine

## 2018-06-29 ENCOUNTER — Ambulatory Visit: Payer: Medicare Other | Admitting: Licensed Clinical Social Worker

## 2018-06-29 DIAGNOSIS — F3341 Major depressive disorder, recurrent, in partial remission: Secondary | ICD-10-CM

## 2018-06-29 DIAGNOSIS — E1169 Type 2 diabetes mellitus with other specified complication: Secondary | ICD-10-CM

## 2018-06-29 DIAGNOSIS — K219 Gastro-esophageal reflux disease without esophagitis: Secondary | ICD-10-CM

## 2018-06-29 DIAGNOSIS — L851 Acquired keratosis [keratoderma] palmaris et plantaris: Secondary | ICD-10-CM | POA: Diagnosis not present

## 2018-06-29 DIAGNOSIS — I152 Hypertension secondary to endocrine disorders: Secondary | ICD-10-CM

## 2018-06-29 DIAGNOSIS — B351 Tinea unguium: Secondary | ICD-10-CM | POA: Diagnosis not present

## 2018-06-29 DIAGNOSIS — I10 Essential (primary) hypertension: Secondary | ICD-10-CM

## 2018-06-29 DIAGNOSIS — E781 Pure hyperglyceridemia: Secondary | ICD-10-CM

## 2018-06-29 DIAGNOSIS — F79 Unspecified intellectual disabilities: Secondary | ICD-10-CM

## 2018-06-29 DIAGNOSIS — E1142 Type 2 diabetes mellitus with diabetic polyneuropathy: Secondary | ICD-10-CM | POA: Diagnosis not present

## 2018-06-29 DIAGNOSIS — E1159 Type 2 diabetes mellitus with other circulatory complications: Secondary | ICD-10-CM

## 2018-06-29 NOTE — Chronic Care Management (AMB) (Signed)
Care Management Note   David Zamora is a 58 y.o. year old male who is a primary care patient of David [facility], David Kaufmann, MD. The CM team was consulted for assistance with chronic disease management and care coordination.   I reached out to David Zamora by phone today.   David Zamora was given information about Chronic Care Management services today including:  1. CCM service includes personalized support from designated David staff supervised by his physician, including individualized plan of care and coordination with other care providers 2. 24/7 contact phone numbers for assistance for urgent and routine care needs. 3. Service will only be billed when office David staff spend 20 minutes or more in a month to coordinate care. 4. Only one practitioner may furnish and bill the service in a calendar month. 5. The patient may stop CCM services at any time (effective at the end of the month) by phone call to the office staff. 6. The patient will be responsible for cost sharing (co-pay) of up to 20% of the service fee (after annual deductible is met). Patient agreed to services and verbal consent obtained.    Review of patient status, including review of consultants reports, relevant laboratory and other test results, and collaboration with appropriate care team members and the patient's provider was performed as part of comprehensive patient evaluation and provision of chronic care management services.   Social Determinants of Health: Client resides at [facility] in [place], [place]. Has daily support from CNA . Has meals prepared for him daily and has assistance in getting medications as prescribed.  Medium Risk for tobacco use: At risk for Intimate Partner violence. Attends appointments as scheduled with psychiatrist.  Recent Suicide Attempt ( cutting self with glass; cleared to return to [facility]. )  Goals Addressed            This Visit's Progress   . Client  will talk with David Zamora about feelings of anger at being at [facility] (pt-stated)       Current Barriers:  David Zamora Kitchen Mental Health Concerns  . Social Isolation  . Self Harm History  David [facility] David Goal(s):  David Zamora Kitchen Over the next 30 days, client will Zamora with SW to address concerns related to client's anger at being placed at [place]  Interventions: . David Zamora talked with David Zamora about activities at [place] [facility]. . Talked with client about his feelings of anger at being placed at [facility] . Talked with client about strategies for managing anger outbursts of client . David Zamora talked with David Zamora about managing anger outbursts. David Zamora Kitchen David Zamora talked with David Zamora about triggers for anger outbursts (hearing falsehood from another person, seeing others active in selfish ways, if another person tries to pressure him to act in certain ways)  Patient Self Care Activities:  . Attends all scheduled provider appointments . Attends church or other social activities . Goes with group from [facility] to [place] once a month . Watches favorite TV shows to relax  Plan:  .  Patient will use strategies discussed to help him manage anger outbursts . David Zamora and client to schedule appointment for face to face counseling session of David Zamora with client at practice location . Patient will attend appointments as scheduled with psychiatrist. . Patient will communicate needs to care provider staff at [facility]  Initial goal documentation     Follow Up Plan: David Zamora and client to schedule  appointment for face to face counseling session of David Zamora and client at practice location  [name].[name] MSW, David [facility] Licensed David Social Worker [facility] Care Management [phone]

## 2018-06-29 NOTE — Patient Instructions (Addendum)
Licensed Clinical Social Worker Visit Information  Goals we discussed today:  Goals Addressed            This Visit's Progress   . Client will talk with LCSW about feelings of anger at being at [name] (pt-stated)       Current Barriers:  [name] Kitchen Mental Health Concerns  . Social Isolation  . Self Harm History  Clinical Social Work Clinical Goal(s):  [name] Kitchen Over the next 30 days, client will work with SW to address concerns related to client's anger at being placed at [place]  Interventions:  LCSW talked with Zaviyar about activities at [place] [facility].  Talked with client about his feelings of anger at being placed at [facility]  Talked with client about strategies for managing anger outbursts of client  LCSW talked with [place] about managing anger outbursts.  LCSW talked with Kasandra Knudsen about triggers for anger outbursts (hearing falsehood from another person, seeing others active in selfish ways, if another person tries to pressure him to act in certain ways)  Patient Self Care Activities:   Attends all scheduled provider appointments  Attends church or other social activities  Goes with group from [facility] to [place] once a month  Watches favorite TV shows to relax  Plan:    Patient will use strategies discussed to help him manage anger outbursts  LCSW and client to schedule appointment for face to face counseling session of LCSW with client at practice location  Patient will attend appointments as scheduled with psychiatrist.  Patient will communicate needs to care provider staff at [facility]  Initial goal documentation         Materials Provided: No  Follow Up Plan: LCSW and client to schedule a face to face counseling sesison of LCSW and client at practice location in next 2 weeks  The patient verbalized understanding of instructions provided today and declined a print copy of patient  instruction materials.   [name].Anamika Kueker MSW, LCSW Licensed Clinical Social Worker [facility] Care [facility] [phone]

## 2018-07-09 ENCOUNTER — Ambulatory Visit: Payer: Medicare Other | Admitting: Licensed Clinical Social Worker

## 2018-07-09 DIAGNOSIS — F3341 Major depressive disorder, recurrent, in partial remission: Secondary | ICD-10-CM

## 2018-07-09 DIAGNOSIS — F79 Unspecified intellectual disabilities: Secondary | ICD-10-CM

## 2018-07-09 DIAGNOSIS — E1159 Type 2 diabetes mellitus with other circulatory complications: Secondary | ICD-10-CM

## 2018-07-09 DIAGNOSIS — I152 Hypertension secondary to endocrine disorders: Secondary | ICD-10-CM

## 2018-07-09 DIAGNOSIS — E781 Pure hyperglyceridemia: Principal | ICD-10-CM

## 2018-07-09 DIAGNOSIS — E1169 Type 2 diabetes mellitus with other specified complication: Secondary | ICD-10-CM

## 2018-07-09 DIAGNOSIS — K219 Gastro-esophageal reflux disease without esophagitis: Secondary | ICD-10-CM

## 2018-07-09 DIAGNOSIS — I10 Essential (primary) hypertension: Secondary | ICD-10-CM

## 2018-07-09 NOTE — Patient Instructions (Addendum)
Licensed Clinical Social Worker Visit Information  Client was scheduled for a face to face office visit with LCSW on 07/09/2018. [place], a client caregiver at [facility] called LCSW and asked if instead of face to face appointment, if LCSW could call client and talk via phone with client on 07/09/2018. LCSW agreed and LCSW called client on 07/09/2018 to discuss client needs.   Review of patient status, including review of consultants reports, relevant laboratory and other test results, and collaboration with appropriate care team members and the patient's provider was performed as part of comprehensive patient evaluation and provision of chronic care management services.   Goal: Patient Stated:  "I want to talk with someone about my feelings of anger at being placed at [facility]:  Current Barriers:   Mental Health Concerns   Social Isolation   Self Harm history  Clinical Social Work Clinical Goal(s):   Over the next 30 days, client will work with SW to address concerns related to client's anger at being placed at [place]  Interventions:  Provided patient with information about CCM program services   Talked with client about his feelings of anger at being placed at [facility]    Talked with client about strategies for managing anger outbursts of client   LCSW talked again with client  about triggers for anger outbursts (hearing falsehood from another person, seeing others act in selfish ways, feeling pressure from another person for [place] to act in a certain way)  Provided counseling support  Patient Self Care Activities:   Attends all scheduled provider appointments  Attends church or other social activities   Goes with group from [place] to [place] once a month  Watches favorite TV shows to relax  Plan:  Patient will use strategies discussed to help him manage anger outbursts.  Patient will attend  appointments as scheduled with psychiatrist     Patient will communicate needs to care provider staff at [facility] to call client in next 3 weeks to talk with client about client anger management and techniques he was using to manage anger issues   Initial goal documentation  Follow Up Plan:  LCSW to call client in next 3 weeks to talk with client about client anger management and techniques he was using to manage anger issues.  Materials Provided: No  The patient verbalized understanding of instructions provided today and declined a print copy of patient instruction materials.   [name].Tedford Berg MSW, LCSW Licensed Clinical Social Worker [facility] [phone]

## 2018-07-09 NOTE — Chronic Care Management (AMB) (Signed)
  Care Management Note   David Zamora is a 58 y.o. year old male who is a primary care patient of Dettinger, David Kaufmann, MD. The CM team was consulted for assistance with chronic disease management and care coordination.   Client was scheduled for a face to face office visit with LCSW on 07/09/2018. [facility], a client caregiver at [facility] called LCSW and asked if instead of face to face appointment, if LCSW could call client and talk via phone with client on 07/09/2018. LCSW agreed and LCSW called client on 07/09/2018 to discuss client needs.   Review of patient status, including review of consultants reports, relevant laboratory and other test results, and collaboration with appropriate care team members and the patient's provider was performed as part of comprehensive patient evaluation and provision of chronic care management services.   Social Determinants of Health: Client resides at [facility] in [place], [place]. Has daily support from CNA. Has meals prepared for hm daily and has assistance in getting medications  as prescribed; at risk for tobacco use; at risk for Intimate Partner violence. Attends appointments as scheduled with psychiatrist. Recent Suicide Attempt (cutting self with glass; cleared to return to [facility])  Goal: Patient Stated:  "I want to talk with someone about my feelings of anger at being placed at [name]  [facility]:  Current Barriers:  [name] Kitchen Mental Health Concerns  . Social Isolation  . Self Harm history  Clinical Social Work Clinical Goal(s):  [name] Kitchen Over the next 30 days, client will work with SW to address concerns related to client's anger at being placed at [facility]  Interventions: . Provided patient with information about CCM program services   Talked with client about his feelings of anger at being placed at [facility]    Talked with client about strategies for managing anger outbursts of client   LCSW talked  again with client  about triggers for anger outbursts (hearing falsehood from another person, seeing others act in selfish ways, feeling pressure from another person for [facility] to act in a certain way)  Provided counseling support  Patient Self Care Activities:  . Attends all scheduled provider appointments . Attends church or other social activities  . Goes with group from [place] to [place] once a month . Watches favorite TV shows to relax  Plan:  Patient will use strategies discussed to help him manage anger outbursts.  Patient will attend appointments as scheduled with psychiatrist     Patient will communicate needs to care provider staff at [facility] to call client in next 3 weeks to talk with client about client anger management and techniques he was using to manage anger issues   Initial goal documentation  Follow Up Plan:  LCSW to call client in next 3 weeks to talk with client about client anger management and techniques he was using to manage anger issues.  [name].Casmir Auguste MSW, LCSW Licensed Clinical Social Worker [facility] [phone]

## 2018-07-29 ENCOUNTER — Ambulatory Visit (INDEPENDENT_AMBULATORY_CARE_PROVIDER_SITE_OTHER): Payer: Medicare Other | Admitting: Licensed Clinical Social Worker

## 2018-07-29 DIAGNOSIS — I10 Essential (primary) hypertension: Secondary | ICD-10-CM | POA: Diagnosis not present

## 2018-07-29 DIAGNOSIS — E1169 Type 2 diabetes mellitus with other specified complication: Secondary | ICD-10-CM | POA: Diagnosis not present

## 2018-07-29 DIAGNOSIS — F3341 Major depressive disorder, recurrent, in partial remission: Secondary | ICD-10-CM

## 2018-07-29 DIAGNOSIS — I152 Hypertension secondary to endocrine disorders: Secondary | ICD-10-CM

## 2018-07-29 DIAGNOSIS — E781 Pure hyperglyceridemia: Secondary | ICD-10-CM

## 2018-07-29 DIAGNOSIS — F79 Unspecified intellectual disabilities: Secondary | ICD-10-CM

## 2018-07-29 DIAGNOSIS — E1159 Type 2 diabetes mellitus with other circulatory complications: Secondary | ICD-10-CM

## 2018-07-29 DIAGNOSIS — K219 Gastro-esophageal reflux disease without esophagitis: Secondary | ICD-10-CM

## 2018-07-29 NOTE — Patient Instructions (Signed)
Licensed Clinical Social Worker Visit Information  Goals we discussed today:  Goals Addressed            This Visit's Progress   . Client will talk with LCSW about feelings of anger at being at [name] (pt-stated)       Current Barriers:  [name] Kitchen Mental Health Concerns  . Social Isolation  . Self Harm History  Clinical Social Work Clinical Goal(s):  [name] Kitchen Over the next 30 days, client will work with SW to address concerns related to client's anger at being placed at [facility]  Interventions: . Talked with client about his feelings of anger at being placed at [facility] . Talked with client about strategies for managing anger outbursts of client . Talked with client about social support network . Provided counseling support for client . Talked with client about relaxation techniques to help manage anger (going on group outings, watching birds, listening to favorite music)  Patient Self Care Activities:  . Attends all scheduled provider appointments . Attends church or other social activities  Plan:  . Patient will call LCSW , as needed, to discuss client feelings of anger and managing feelings of anger  . Patient will use strategies discussed to help him manage anger outbursts . Patient will talk with his PCP regarding anger outbursts of client . Patient will attend appointments as scheduled with psychiatrist. . Patient will communicate needs to care provider staff at [facility] . LCSW to call client in next 3 weeks to discuss client management of anger issues regarding current placement  Initial goal documentation     Materials Provided: No  Follow Up Plan: LCSW to call client in next 3 weeks to discuss client management of anger issues  regarding current placement  The patient verbalized understanding of instructions provided today and declined a print copy of patient instruction materials.   [name].Tabb Croghan MSW, LCSW Licensed Clinical  Social Worker [facility] Care [facility] [phone]

## 2018-07-29 NOTE — Chronic Care Management (AMB) (Signed)
  Care Management Note  David Zamora is a 59 y.o. year old male who is a primary care patient of Dettinger, Fransisca Kaufmann, MD. The CM team was consulted for assistance with chronic disease management and care coordination.   I reached out to [name] by phone today.   Review of patient status, including review of consultants reports, relevant laboratory and other test results, and collaboration with appropriate care team members and the patient's provider was performed as part of comprehensive patient evaluation and provision of chronic care management services.   Social Determinants of Health:At risk for Intimate Partner Violence    Office Visit from 02/02/2017 in [facility]  PHQ-9 Total Score  7     Goals Addressed            This Visit's Progress   . Client will talk with LCSW about feelings of anger at being at [name] (pt-stated)       Current Barriers:  [name] Kitchen Mental Health Concerns  . Social Isolation  . Self Harm History  Clinical Social Work Clinical Goal(s):  [name] Kitchen Over the next 30 days, client will work with SW to address concerns related to client's anger at being placed at [facility]  Interventions: . Talked with client about his feelings of anger at being placed at [facility] . Talked with client about strategies for managing anger outbursts of client . Talked with client about social support network . Talked with client about use of relaxation techniques to help with anger management (taking a nap, looking at birds, listen to favorite CD music)  Patient Self Care Activities:  . Attends all scheduled provider appointments . Attends group home activities as able. (going on short outings, social activities with other group home residents ) Plan:  . Patient will call LCSW , as needed, to discuss client feelings of anger and managing feelings of anger  . Patient will use strategies discussed to help him manage anger  outbursts . Patient will talk with his PCP regarding anger outbursts of client . Patient will attend appointments as scheduled with psychiatrist. . Patient will communicate needs to care provider staff at [facility] . LCSW to call client in next 3 weeks to discuss client management of anger issues regarding current placement  Initial goal documentation     Follow Up Plan: LCSW will call client in next 3 weeks to discuss client management of anger issues revolving his current placement  [name].Kamrie Fanton MSW, LCSW Licensed Clinical Social Worker [facility] [phone]

## 2018-08-06 ENCOUNTER — Other Ambulatory Visit: Payer: Self-pay | Admitting: Family Medicine

## 2018-08-09 ENCOUNTER — Other Ambulatory Visit: Payer: Self-pay | Admitting: Family Medicine

## 2018-08-19 ENCOUNTER — Ambulatory Visit (INDEPENDENT_AMBULATORY_CARE_PROVIDER_SITE_OTHER): Payer: Medicare Other | Admitting: Licensed Clinical Social Worker

## 2018-08-19 DIAGNOSIS — F79 Unspecified intellectual disabilities: Secondary | ICD-10-CM

## 2018-08-19 DIAGNOSIS — I10 Essential (primary) hypertension: Secondary | ICD-10-CM

## 2018-08-19 DIAGNOSIS — F3341 Major depressive disorder, recurrent, in partial remission: Secondary | ICD-10-CM | POA: Diagnosis not present

## 2018-08-19 DIAGNOSIS — E1159 Type 2 diabetes mellitus with other circulatory complications: Secondary | ICD-10-CM

## 2018-08-19 DIAGNOSIS — K219 Gastro-esophageal reflux disease without esophagitis: Secondary | ICD-10-CM

## 2018-08-19 DIAGNOSIS — E1169 Type 2 diabetes mellitus with other specified complication: Secondary | ICD-10-CM

## 2018-08-19 DIAGNOSIS — E781 Pure hyperglyceridemia: Secondary | ICD-10-CM

## 2018-08-19 DIAGNOSIS — I152 Hypertension secondary to endocrine disorders: Secondary | ICD-10-CM

## 2018-08-19 NOTE — Patient Instructions (Signed)
Licensed Clinical Social Worker Visit Information  Goals we discussed today:  Goals Addressed            This Visit's Progress   . Client will talk with LCSW about feelings of anger at being at [name] (pt-stated)       Current Barriers:  [name] Kitchen Mental Health Concerns  . Social Isolation  . Self Harm History  Clinical Social Work Clinical Goal(s):  [name] Kitchen Over the next 30 days, client will work with LCSW to address concerns related to client's anger at being placed at [facility]  Interventions: . Talked with client about his feelings of anger at being placed at [place] . Talked with client about strategies for managing anger outbursts of client . Talked with client about social support network  . Talked with client about recreational activities of choice (watching birds, listening to music, watching favorite TV shows,going on rides in [place] to look at countryside, doing drawing/art work in [employer] copy book)  . Talked with client about his Guardian and her help [name])   Patient Self Care Activities:  . Attends all scheduled provider appointments . Attends church or other social activities  Plan:  . Patient will call LCSW , as needed, to discuss client feelings of anger and managing feelings of anger  . Patient will use strategies discussed to help him manage anger outbursts . Patient will talk with his PCP regarding anger outbursts of client . Patient will attend appointments as scheduled with psychiatrist. . Patient will communicate needs to care provider staff at [facility] . LCSW to call client in next 3 weeks to discuss client management of anger issues regarding current placement  Initial goal documentation     Materials Provided: No   Follow Up Plan: LCSW to call client in next 3 weeks to discuss client management of anger issues regarding current placement  The patient verbalized understanding of instructions provided today and  declined a print copy of patient instruction materials.   [name].Lucee Brissett MSW, LCSW Licensed Clinical Social Worker [facility] [phone]

## 2018-08-19 NOTE — Chronic Care Management (AMB) (Signed)
  Care Management Note   David Zamora is a 58 y.o. year old male who is a primary care patient of Dettinger, Fransisca Kaufmann, MD. The CM team was consulted for assistance with chronic disease management and care coordination.   I reached out to [name] by phone today.   Review of patient status, including review of consultants reports, relevant laboratory and other test results, and collaboration with appropriate care team members and the patient's provider was performed as part of comprehensive patient evaluation and provision of chronic care management services.   Social Determinants of Health:Risk of Intimate Partner Violence    Office Visit from 02/02/2017 in [facility]  PHQ-9 Total Score  7     Goals Addressed            This Visit's Progress   . Client will talk with LCSW about feelings of anger at being at [name] (pt-stated)       Current Barriers:  [name] Kitchen Mental Health Concerns  . Social Isolation  . Self Harm History  Clinical Social Work Clinical Goal(s):  [name] Kitchen Over the next 30 days, client will work with LCSW to address concerns related to client's anger at being placed at [name]  Interventions: . Talked with client about his feelings of anger at being placed at [name] . Talked with client about strategies for managing anger outbursts of client . Talked with client about recreational activities of choice (watching birds, listening to music, watching favorite TV shows, riding in [name] to see countryside,art drawing/coloring in art book) . Talked with [name] about his guardian and her help [name])  Patient Self Care Activities:  . Attends all scheduled provider appointments . Attends church or other social activities  Plan:  . Patient will call LCSW , as needed, to discuss client feelings of anger and managing feelings of anger  . Patient will use strategies discussed to help him manage anger outbursts .  Patient will talk with his PCP regarding anger outbursts of client . Patient will attend appointments as scheduled with psychiatrist. . Patient will communicate needs to care provider staff at [facility] . LCSW to call client in next 3 weeks to discuss client management of anger issues regarding current placement  Initial goal documentation     Follow Up Plan: LCSW will call client in next 3 weeks to discuss client management of anger issues regarding current client placement  [name].Theodoro Koval MSW, LCSW Licensed Clinical Social Worker [facility] [phone]

## 2018-08-25 ENCOUNTER — Other Ambulatory Visit: Payer: Self-pay

## 2018-08-25 ENCOUNTER — Ambulatory Visit (INDEPENDENT_AMBULATORY_CARE_PROVIDER_SITE_OTHER): Payer: Medicare Other | Admitting: Family Medicine

## 2018-08-25 ENCOUNTER — Encounter: Payer: Self-pay | Admitting: Family Medicine

## 2018-08-25 DIAGNOSIS — E1169 Type 2 diabetes mellitus with other specified complication: Secondary | ICD-10-CM | POA: Diagnosis not present

## 2018-08-25 DIAGNOSIS — E1159 Type 2 diabetes mellitus with other circulatory complications: Secondary | ICD-10-CM

## 2018-08-25 DIAGNOSIS — N183 Chronic kidney disease, stage 3 unspecified: Secondary | ICD-10-CM

## 2018-08-25 DIAGNOSIS — I10 Essential (primary) hypertension: Secondary | ICD-10-CM | POA: Diagnosis not present

## 2018-08-25 DIAGNOSIS — E781 Pure hyperglyceridemia: Secondary | ICD-10-CM

## 2018-08-25 DIAGNOSIS — I152 Hypertension secondary to endocrine disorders: Secondary | ICD-10-CM

## 2018-08-25 MED ORDER — INSULIN DEGLUDEC-LIRAGLUTIDE 100-3.6 UNIT-MG/ML ~~LOC~~ SOPN: 50.0000 [IU] | PEN_INJECTOR | Freq: Every day | SUBCUTANEOUS | 5 refills | Status: AC

## 2018-08-25 NOTE — Progress Notes (Signed)
Virtual Visit via telephone Note  I connected with David Zamora on 08/25/18 at 1257 by telephone and verified that I am speaking with the correct person using two identifiers. David Zamora is currently located at home and [place] his caretaker are currently with her during visit. The provider, Fransisca Kaufmann Canaan Holzer, MD is located in their office at time of visit.  Call ended at 1315  I discussed the limitations, risks, security and privacy concerns of performing an evaluation and management service by telephone and the availability of in person appointments. I also discussed with the patient that there may be a patient responsible charge related to this service. The patient expressed understanding and agreed to proceed.   History and Present Illness: Type 2 diabetes mellitus Patient comes in today for recheck of his diabetes. Patient has been currently taking glipizide, bs 238, 276, 321, 371. Patient is currently on an ACE inhibitor/ARB. Patient has not seen an ophthalmologist this year. Patient denies any issues with their feet.  Patient has ckd 3  Hypertension Patient is currently on lisinopril, and their blood pressure today is 156/94-116/78. Patient denies any lightheadedness or dizziness. Patient denies headaches, blurred vision, chest pains, shortness of breath, or weakness. Denies any side effects from medication and is content with current medication.   Hyperlipidemia Patient is coming in for recheck of his hyperlipidemia. The patient is currently taking simvastatin. They deny any issues with myalgias or history of liver damage from it. They deny any focal numbness or weakness or chest pain.   No diagnosis found.  Outpatient Encounter Medications as of 08/25/2018  Medication Sig   blood glucose meter kit and supplies KIT Dispense based on patient and insurance preference. Use up to four times daily as directed. (FOR ICD-9 250.00, 250.01).   buPROPion (WELLBUTRIN XL) 150 MG 24  hr tablet Take 150 mg by mouth every morning.    busPIRone (BUSPAR) 10 MG tablet Take 10 mg by mouth 2 (two) times daily.   Carboxymeth-Glycerin-Polysorb (REFRESH OPTIVE ADVANCED) 0.5-1-0.5 % SOLN Apply 1 drop to eye 4 (four) times daily.   Cholecalciferol (VITAMIN D3) 50000 units TABS Take 50,000 Units by mouth every Saturday.    divalproex (DEPAKOTE ER) 250 MG 24 hr tablet Take 250-1,000 mg by mouth See admin instructions. 219m in the morning and 10038m at bedtime   Emollient (CERAVE) CREA APPLY 1 APPLICATION ONCE A DAY TO BOTH FEET (Patient taking differently: Apply 1 application topically daily. To both feet)   glipiZIDE (GLUCOTROL XL) 10 MG 24 hr tablet TAKE (1) TABLET BY MOUTH ONCE DAILY. (Patient taking differently: Take 10 mg by mouth daily with breakfast. )   lisinopril (PRINIVIL,ZESTRIL) 10 MG tablet Take 10 mg by mouth daily.    metFORMIN (GLUCOPHAGE) 1000 MG tablet TAKE 1 TABLET BY MOUTH TWICE DAILY WITH MEALS. (Patient taking differently: Take 1,000 mg by mouth 2 (two) times daily with a meal. )   omeprazole (PRILOSEC) 20 MG capsule TAKE 1 CAPSULE BY MOUTH ONCE DAILY.   PARoxetine (PAXIL) 20 MG tablet Take 20 mg by mouth every morning.   QUEtiapine (SEROQUEL XR) 300 MG 24 hr tablet Take 300 mg by mouth daily. Taken daily at 4pm   simvastatin (ZOCOR) 40 MG tablet TAKE (1) TABLET BY MOUTH ONCE DAILY.   tamsulosin (FLOMAX) 0.4 MG CAPS capsule TAKE 1 CAPSULE BY MOUTH ONCE A DAY.   triamcinolone ointment (KENALOG) 0.5 % APPLY TOPICALLY TWICE DAILY. (Patient taking differently: Apply 1 application topically 2 (  two) times daily. )   VASCEPA 1 g CAPS TAKE (2) CAPSULES BY MOUTH TWICE DAILY.   XULTOPHY 100-3.6 UNIT-MG/ML SOPN INJECT 40 UNITS INTO THE SKIN DAILY   No facility-administered encounter medications on file as of 08/25/2018.     Review of Systems  Constitutional: Negative for chills and fever.  Eyes: Negative for visual disturbance.  Respiratory: Negative for  shortness of breath and wheezing.   Cardiovascular: Negative for chest pain and leg swelling.  Musculoskeletal: Negative for back pain and gait problem.  Skin: Negative for rash.  Neurological: Negative for dizziness, weakness, light-headedness and numbness.  Psychiatric/Behavioral: Negative for behavioral problems.  All other systems reviewed and are negative.   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 The patient was provided an opportunity to ask questions and all were answered. The patient agreed with the plan and demonstrated an understanding of the instructions.   The patient was advised to call back or seek an in-person evaluation if the symptoms worsen or if the condition fails to improve as anticipated.  The above assessment and management plan was discussed with the patient. The patient verbalized understanding of and has agreed to the management plan. Patient is aware to call the clinic if symptoms persist or worsen. Patient is aware when to return to the clinic for a follow-up visit. Patient educated on when it is appropriate to go to the emergency department.    I provided 18 minutes of non-face-to-face time during this encounter.    Worthy Rancher, MD

## 2018-09-03 ENCOUNTER — Encounter: Payer: Self-pay | Admitting: Family Medicine

## 2018-09-03 MED ORDER — NICOTINE 7 MG/24HR TD PT24: 7.0000 mg | MEDICATED_PATCH | Freq: Every day | TRANSDERMAL | 0 refills | Status: DC

## 2018-09-07 DIAGNOSIS — B351 Tinea unguium: Secondary | ICD-10-CM | POA: Diagnosis not present

## 2018-09-07 DIAGNOSIS — L851 Acquired keratosis [keratoderma] palmaris et plantaris: Secondary | ICD-10-CM | POA: Diagnosis not present

## 2018-09-07 DIAGNOSIS — E1142 Type 2 diabetes mellitus with diabetic polyneuropathy: Secondary | ICD-10-CM | POA: Diagnosis not present

## 2018-09-09 ENCOUNTER — Ambulatory Visit: Payer: Medicare Other | Admitting: Licensed Clinical Social Worker

## 2018-09-09 DIAGNOSIS — I152 Hypertension secondary to endocrine disorders: Secondary | ICD-10-CM

## 2018-09-09 DIAGNOSIS — E1159 Type 2 diabetes mellitus with other circulatory complications: Secondary | ICD-10-CM

## 2018-09-09 DIAGNOSIS — F79 Unspecified intellectual disabilities: Secondary | ICD-10-CM

## 2018-09-09 DIAGNOSIS — K219 Gastro-esophageal reflux disease without esophagitis: Secondary | ICD-10-CM

## 2018-09-09 DIAGNOSIS — F3341 Major depressive disorder, recurrent, in partial remission: Secondary | ICD-10-CM

## 2018-09-09 DIAGNOSIS — E1169 Type 2 diabetes mellitus with other specified complication: Secondary | ICD-10-CM

## 2018-09-09 NOTE — Patient Instructions (Addendum)
Licensed Clinical Social Worker Visit Information  Materials Provided: No     Client reported that he enjoys recreational activities of choice such as watching birds, drawing in art/color book, going with group home residents on [place] rides in community, listening to music. He said he is eating well and sleeping well. LCSW spoke with client about support he receives from care staff at [facility]. Client spoke of a recent police visit to [place].  He said he sometimes gets anxious when he sees the Police. LCSW spoke with client about support from his [place], [name]. Client said he speaks usually with Guardian via phone. He said he is doing well in managing anger.  When angry, he likes to listen to music, or take a nap in room or watch TV or watch pet birds.  Client said he likes to go to eat at a restaurant when able. He said he is getting along well with caregivers at [facility].  He did not mention any pain issues.  He said his mother is at [facility] in [place], [place]. He said he has a sister who resides at a care facility in [place].  LCSW encouraged client or [place], caregiver at facility call LCSW as needed to discuss social work needs of client. Client said he is getting along well with staff and had been managing anger well recently  Follow Up Plan:LCSW will call client in next 3 weeks to discuss client management of anger issues regarding current client placement  The patient verbalized understanding of instructions provided today and declined a print copy of patient instruction materials.   [name].Theta Leaf MSW, LCSW Licensed Clinical Social Worker [facility] Care [facility] [phone]

## 2018-09-09 NOTE — Chronic Care Management (AMB) (Signed)
  Care Management Note   David Zamora is a 58 y.o. year old male who is a primary care patient of Dettinger, Fransisca Kaufmann, MD. The CM team was consulted for assistance with chronic disease management and care coordination.   I reached out to David Zamora by phone today.   Review of patient status, including review of consultants reports, relevant laboratory and other test results, and collaboration with appropriate care team members and the patient's provider was performed as part of comprehensive patient evaluation and provision of chronic care management services.   Social Determinants of Health: Risk for tobacco exposure; risk for intimate partner violence    Office Visit from 02/02/2017 in [facility]  PHQ-9 Total Score  7       Client reported that he enjoys recreational activities of choice such as watching birds, drawing in art/color book, going with group home residents on [place] rides in community, listening to music. He said he is eating well and sleeping well. LCSW spoke with client about support he receives from care staff at [facility]. Client spoke of a recent police visit to [place].  He said he sometimes gets anxious when he sees the Police. LCSW spoke with client about support from his [place], David Zamora. Client said he speaks usually with Guardian via phone. He said he is doing well in managing anger.  When angry, he likes to listen to music, or take a nap in room or watch TV or watch pet birds.  Client said he likes to go to eat at a restaurant when able. He said he is getting along well with caregivers at [facility].  He did not mention any pain issues.  He said his mother is at [facility] in [place], [place]. He said he has a sister who resides at a care facility in [place].  LCSW encouraged client or David Zamora, caregiver at facility call LCSW as needed to discuss social work needs of client. Client said he is getting along well with staff and  had been managing anger well recently  Follow Up Plan: LCSW will call client in next 3 weeks to discuss client management of anger issues regarding current client placement  David Zamora.David Zamora MSW, LCSW Licensed Clinical Social Worker [facility] [phone]

## 2018-09-14 DIAGNOSIS — F319 Bipolar disorder, unspecified: Secondary | ICD-10-CM | POA: Diagnosis not present

## 2018-09-28 ENCOUNTER — Other Ambulatory Visit: Payer: Self-pay | Admitting: Family Medicine

## 2018-09-30 ENCOUNTER — Encounter: Payer: Self-pay | Admitting: Licensed Clinical Social Worker

## 2018-09-30 ENCOUNTER — Ambulatory Visit (INDEPENDENT_AMBULATORY_CARE_PROVIDER_SITE_OTHER): Payer: Medicare Other | Admitting: Licensed Clinical Social Worker

## 2018-09-30 DIAGNOSIS — E1169 Type 2 diabetes mellitus with other specified complication: Secondary | ICD-10-CM

## 2018-09-30 DIAGNOSIS — I10 Essential (primary) hypertension: Secondary | ICD-10-CM

## 2018-09-30 DIAGNOSIS — F79 Unspecified intellectual disabilities: Secondary | ICD-10-CM

## 2018-09-30 DIAGNOSIS — F3341 Major depressive disorder, recurrent, in partial remission: Secondary | ICD-10-CM

## 2018-09-30 DIAGNOSIS — E781 Pure hyperglyceridemia: Secondary | ICD-10-CM | POA: Diagnosis not present

## 2018-09-30 DIAGNOSIS — E1159 Type 2 diabetes mellitus with other circulatory complications: Secondary | ICD-10-CM

## 2018-09-30 DIAGNOSIS — I152 Hypertension secondary to endocrine disorders: Secondary | ICD-10-CM

## 2018-09-30 DIAGNOSIS — K219 Gastro-esophageal reflux disease without esophagitis: Secondary | ICD-10-CM

## 2018-09-30 NOTE — Patient Instructions (Signed)
Licensed Clinical Social Worker Visit Information  Goals we discussed today:  Goals    . Client will talk with LCSW about feelings of anger at being at [name] (pt-stated)     Current Barriers:  [name] Kitchen Mental Health Concerns  . Social Isolation  . Self Harm History  Clinical Social Work Clinical Goal(s):  [name] Kitchen Over the next 30 days, client will work with LCSW to address concerns related to client's anger at being placed at [facility]  Interventions: . Talked with client about his feelings of anger at being placed at [facility] . Talked with client about strategies for managing anger outbursts of client . Talked with client about social support network  . Talked with client about recreational activities of choice (watching birds, listening to music, watching favorite TV shows)  Patient Self Care Activities:  . Attends all scheduled provider appointments . Attends church or other social activities  Plan:  . Patient will call LCSW , as needed, to discuss client feelings of anger and managing feelings of anger  . Patient will use strategies discussed to help him manage anger outbursts . Patient will attend appointments as scheduled with psychiatrist. . Patient will communicate needs to care provider staff at [facility] . LCSW to call client in next 3 weeks to discuss client management of anger issues regarding current placement  Initial goal documentation    .           Materials Provided: No  Follow Up Plan: LCSW to call client in next 3 weeks to discuss client management of anger issues regarding current placement  The patient verbalized understanding of instructions provided today and declined a print copy of patient instruction materials.   [name].Nthony Lefferts MSW, LCSW Licensed Clinical Social Worker [facility] Care [facility] [phone]

## 2018-09-30 NOTE — Chronic Care Management (AMB) (Signed)
  Chronic Care Management    Clinical Social Work CCM Outreach Note  09/30/2018 Name: David Zamora MRN: [id number] DOB: 08-10-1960  David Zamora is a 58 y.o. year old male who is a primary care patient of Dettinger, Fransisca Kaufmann, MD . The CCM team was consulted for assistance with assessment of psychosocial needs.   LCSW reached out to David Zamora today by phone .   Social Determinants of Health: Risk of intimate partner violence; risk for occasional social isolation    Office Visit from 02/02/2017 in [facility]  PHQ-9 Total Score  7     Goals    . Client will talk with LCSW about feelings of anger at being at [name] (pt-stated)     Current Barriers:  [name] Kitchen Mental Health Concerns  . Social Isolation  . Self Harm History  Clinical Social Work Clinical Goal(s):  [name] Kitchen Over the next 30 days, client will work with LCSW to address concerns related to client's anger at being placed at [facility]  Interventions: . Talked with client about his feelings of anger at being placed at [facility] . Talked with client about strategies for managing anger outbursts of client . Talked with client about social support network  . Talked with client about recreational activities of choice (watching birds, listening to music, watching favorite TV shows)  Patient Self Care Activities:  . Attends all scheduled provider appointments . Attends church or other social activities  Plan:  . Patient will call LCSW , as needed, to discuss client feelings of anger and managing feelings of anger  . Patient will use strategies discussed to help him manage anger outbursts . Patient will attend appointments as scheduled with psychiatrist. . Patient will communicate needs to care provider staff at [facility] . LCSW to call client in next 3 weeks to discuss client management of anger issues regarding current placement  Initial goal documentation    .           Client said he talks via phone almost every day with his mother. Client said he speaks via phone occasionally with his sister who lives in [place], [place]. David Zamora said he is attending scheduled medical appointments and mental health appointments. Client spoke of his Guardian, [name] , at [facility].  Client and LCSW spoke of strategies client can use to control anger and angry outbursts. He said he is eating well and sleeping well.  LCSW encouraged David Zamora to continue to seek help from [facility] staff in writing letters to his sister occasionally to communicate more with sister. He said he liked to go on [place] ride with [facility] residents and staff to [place] to eat a meal outdoors. LCSW also talked via phone on 09/30/2018 with [place], caregiver at [facility] about client needs.  Follow Up Plan: LCSW to call client in next 3 weeks to discuss client management of anger issues regarding current placement.  David Zamora.Chanel Mckesson MSW, LCSW Licensed Clinical Social Worker [facility] [phone]

## 2018-10-05 ENCOUNTER — Other Ambulatory Visit: Payer: Self-pay | Admitting: Family Medicine

## 2018-10-05 ENCOUNTER — Other Ambulatory Visit: Payer: Self-pay

## 2018-10-05 MED ORDER — PERMETHRIN 5 % EX CREA: 1.0000 "application " | TOPICAL_CREAM | Freq: Once | CUTANEOUS | 0 refills | Status: AC

## 2018-10-21 ENCOUNTER — Ambulatory Visit: Payer: Medicare Other | Admitting: Licensed Clinical Social Worker

## 2018-10-21 DIAGNOSIS — E781 Pure hyperglyceridemia: Secondary | ICD-10-CM

## 2018-10-21 DIAGNOSIS — K219 Gastro-esophageal reflux disease without esophagitis: Secondary | ICD-10-CM

## 2018-10-21 DIAGNOSIS — F79 Unspecified intellectual disabilities: Secondary | ICD-10-CM

## 2018-10-21 DIAGNOSIS — E1159 Type 2 diabetes mellitus with other circulatory complications: Secondary | ICD-10-CM

## 2018-10-21 DIAGNOSIS — E1169 Type 2 diabetes mellitus with other specified complication: Secondary | ICD-10-CM

## 2018-10-21 DIAGNOSIS — F3341 Major depressive disorder, recurrent, in partial remission: Secondary | ICD-10-CM

## 2018-10-21 DIAGNOSIS — I152 Hypertension secondary to endocrine disorders: Secondary | ICD-10-CM

## 2018-10-21 NOTE — Patient Instructions (Addendum)
Licensed Clinical Social Worker Visit Information  Materials Provided: No     Client said he has been calling his mother regularly  to check on her status. Client's mother is in a local nursing home.  Client did not mention any pain issues. Client said he is sleeping well. Client said he played bingo today at group home. Client said he enjoys going on group home trips to local park .  He said group home trips also go to [place] occasionally.  He enjoys watching TV programs of choice.  Client said his sister resides in [place], [place] and client has been talking with his sister via phone.   He said he is getting along fairly well with others at group home.  He said he talks with [place] or Manuela Schwartz at group home to discuss issues of concern. He also said he talks with [facility]  member at group home related to issues of concern.  LCSW has encouraged client to work on cooperating with caregivers at group home. Client was appreciative of call from LCSW  Follow Up Plan: LCSW to call client in next 3 weeks to assess social work needs of client  The patient verbalized understanding of instructions provided today and declined a print copy of patient instruction materials.    [name].Lyden Redner MSW, LCSW Licensed Clinical Social Worker [facility] [phone]

## 2018-10-21 NOTE — Chronic Care Management (AMB) (Signed)
  Care Management Note   David Zamora is a 58 y.o. year old male who is a primary care patient of Dettinger, Fransisca Kaufmann, MD. The CM team was consulted for assistance with chronic disease management and care coordination.   I reached out to [name] by phone today.   Review of patient status, including review of consultants reports, relevant laboratory and other test results, and collaboration with appropriate care team members and the patient's provider was performed as part of comprehensive patient evaluation and provision of chronic care management services.   Social Determinants of Health: risk of tobacco use ; risk of social isolation    Office Visit from 02/02/2017 in [facility]  PHQ-9 Total Score  7     Client said he has been calling his mother regularly  to check on her status. Client's mother is in a local nursing home.  Client did not mention any pain issues. Client said he is sleeping well. Client said he played bingo today at group home. Client said he enjoys going on group home trips to local park .  He said group home trips also go to local horse farm occasionally.  He enjoys watching TV programs of choice.  Client said his sister resides in [place], [place] and client has been talking with his sister via phone.   He said he is getting along fairly well with others at group home.  He said he talks with [place] or Manuela Schwartz at group home to discuss issues of concern. He also said he talks with [facility]  member at group home related to issues of concern.  LCSW has encouraged client to work on cooperating with caregivers at group home. Client was appreciative of call from LCSW  .   Follow Up Plan:  LCSW to call client in next 3 weeks to assess client social work needs at that time  [name].[name] MSW, LCSW Licensed Clinical Social Worker [facility] [phone]

## 2018-11-05 ENCOUNTER — Ambulatory Visit (INDEPENDENT_AMBULATORY_CARE_PROVIDER_SITE_OTHER): Payer: Medicare Other | Admitting: Licensed Clinical Social Worker

## 2018-11-05 DIAGNOSIS — I10 Essential (primary) hypertension: Secondary | ICD-10-CM

## 2018-11-05 DIAGNOSIS — F3341 Major depressive disorder, recurrent, in partial remission: Secondary | ICD-10-CM

## 2018-11-05 DIAGNOSIS — E1159 Type 2 diabetes mellitus with other circulatory complications: Secondary | ICD-10-CM | POA: Diagnosis not present

## 2018-11-05 DIAGNOSIS — E1169 Type 2 diabetes mellitus with other specified complication: Secondary | ICD-10-CM

## 2018-11-05 DIAGNOSIS — K219 Gastro-esophageal reflux disease without esophagitis: Secondary | ICD-10-CM

## 2018-11-05 DIAGNOSIS — E781 Pure hyperglyceridemia: Secondary | ICD-10-CM | POA: Diagnosis not present

## 2018-11-05 DIAGNOSIS — F79 Unspecified intellectual disabilities: Secondary | ICD-10-CM

## 2018-11-05 DIAGNOSIS — I152 Hypertension secondary to endocrine disorders: Secondary | ICD-10-CM

## 2018-11-05 NOTE — Patient Instructions (Signed)
Licensed Clinical Social Worker Visit Information  Goals we discussed:  Goals    . Client will talk with LCSW about feelings of anger at being at [name] (pt-stated)     Current Barriers:  [name] Kitchen Mental Health Concerns  . Social Isolation  . Self Harm History  Clinical Social Work Clinical Goal(s):  [name] Kitchen Over the next 30 days, client will work with LCSW to address concerns related to client's anger at being placed at [facility]  Interventions: . Talked with client about his feelings of anger at being placed at [facility] . Talked with client about strategies for managing anger outbursts of client . Talked with client about social support network  . Talked with client about recreational activities of choice (watching birds, listening to music, watching favorite TV shows, playing bingo)  Patient Self Care Activities:  . Attends all scheduled provider appointments . Attends church or other social activities  Plan:  . Patient will call LCSW , as needed, to discuss client feelings of anger and managing feelings of anger  . Patient will use strategies discussed to help him manage anger outbursts . Patient will attend appointments as scheduled with psychiatrist. . Patient will communicate needs to care provider staff at [facility] . LCSW to call client in next 3 weeks to discuss client management of anger issues regarding current placement  Initial goal documentation    .      Materials Provided:  No  Follow Up Plan: LCSW to call client in next 3 weeks to discuss client management of anger issues regarding current placement  The patient verbalized understanding of instructions provided today and declined a print copy of patient instruction materials.   [name].Jarrette Dehner MSW, LCSW Licensed Clinical Social Worker [facility] [phone]

## 2018-11-05 NOTE — Chronic Care Management (AMB) (Signed)
Care Management Note   David Zamora is a 58 y.o. year old male who is a primary care patient of Dettinger, Fransisca Kaufmann, MD. The CM team was consulted for assistance with chronic disease management and care coordination.   I reached out to [name] by phone today.   Review of patient status, including review of consultants reports, relevant laboratory and other test results, and collaboration with appropriate care team members and the patient's provider was performed as part of comprehensive patient evaluation and provision of chronic care management services.   Social Determinants of Health: risk of social isolation risk of tobacco use;risk of intimate partner violence    Office Visit from 02/02/2017 in [facility]  PHQ-9 Total Score  7     Goals    . Client will talk with LCSW about feelings of anger at being at [name] (pt-stated)     Current Barriers:  [name] Kitchen Mental Health Concerns  . Social Isolation  . Self Harm History  Clinical Social Work Clinical Goal(s):  [name] Kitchen Over the next 30 days, client will work with LCSW to address concerns related to client's anger at being placed at [facility]  Interventions: . Talked with client about his feelings of anger at being placed at [facility] . Talked with client about strategies for managing anger outbursts of client . Talked with client about social support network  . Talked with client about recreational activities of choice (watching birds, listening to music, watching favorite TV shows, or playing bingo)  Patient Self Care Activities:  . Attends all scheduled provider appointments . Attends church or other social activities  Plan:  . Patient will call LCSW , as needed, to discuss client feelings of anger and managing feelings of anger  . Patient will use strategies discussed to help him manage anger outbursts . Patient will attend appointments as scheduled with psychiatrist. .  Patient will communicate needs to care provider staff at [facility] . LCSW to call client in next 3 weeks to discuss client management of anger issues regarding current placement  Initial goal documentation   Client said he has been calling his mother regularly  to check on her status. Client's mother is in a local nursing home.  Client said he is sleeping well. Client said he enjoys going on group home trips to local park .  He said group home trips also go to local horse farm occasionally.  He enjoys watching TV programs of choice and enjoys playing bingo to relax.  Client said his sister resides in [place], [place] and client has been talking with his sister via phone.   He said he is getting along fairly well with others at group home.  He said he talks with [place] or Manuela Schwartz at group home to discuss issues of concern. He also said he talks with [facility]  member at group home related to issues of concern. LCSW has encouraged client to work on cooperating with caregivers at group home. Client said he has been getting along well with others at group home. He said he called his Guardian, David Zamora, recently to talk with her about his placement. He said he would someday like to possibly move to a group home in the [place], [place] area to be close to his family. He said he talked to his Guardian about this recently.  Client and LCSW spoke of COVID 19 precautions and safety issues.  He and LCSW  spoke of his next appointment with Dr. Warrick Parisian in August of 2020.  Follow Up Plan: LCSW to call client in next 3 weeks to discuss anger issues of client regarding current placement  [name].Inri Sobieski MSW, LCSW Licensed Clinical Social Worker [facility] [phone]

## 2018-11-10 ENCOUNTER — Other Ambulatory Visit: Payer: Self-pay | Admitting: Family Medicine

## 2018-11-16 DIAGNOSIS — B351 Tinea unguium: Secondary | ICD-10-CM | POA: Diagnosis not present

## 2018-11-16 DIAGNOSIS — E1142 Type 2 diabetes mellitus with diabetic polyneuropathy: Secondary | ICD-10-CM | POA: Diagnosis not present

## 2018-11-16 DIAGNOSIS — L851 Acquired keratosis [keratoderma] palmaris et plantaris: Secondary | ICD-10-CM | POA: Diagnosis not present

## 2018-11-26 ENCOUNTER — Ambulatory Visit (INDEPENDENT_AMBULATORY_CARE_PROVIDER_SITE_OTHER): Payer: Medicare Other | Admitting: Licensed Clinical Social Worker

## 2018-11-26 DIAGNOSIS — I152 Hypertension secondary to endocrine disorders: Secondary | ICD-10-CM

## 2018-11-26 DIAGNOSIS — E1159 Type 2 diabetes mellitus with other circulatory complications: Secondary | ICD-10-CM | POA: Diagnosis not present

## 2018-11-26 DIAGNOSIS — E1169 Type 2 diabetes mellitus with other specified complication: Secondary | ICD-10-CM

## 2018-11-26 DIAGNOSIS — I10 Essential (primary) hypertension: Secondary | ICD-10-CM

## 2018-11-26 DIAGNOSIS — F3341 Major depressive disorder, recurrent, in partial remission: Secondary | ICD-10-CM | POA: Diagnosis not present

## 2018-11-26 DIAGNOSIS — E781 Pure hyperglyceridemia: Secondary | ICD-10-CM | POA: Diagnosis not present

## 2018-11-26 DIAGNOSIS — K219 Gastro-esophageal reflux disease without esophagitis: Secondary | ICD-10-CM

## 2018-11-26 DIAGNOSIS — F79 Unspecified intellectual disabilities: Secondary | ICD-10-CM

## 2018-11-26 NOTE — Patient Instructions (Signed)
Licensed Clinical Social Worker Visit Information  Goals we discussed today:  Goals    . Client will talk with LCSW about feelings of anger at being at [name] (pt-stated)     Current Barriers:  . Mental Health Concerns  . Social Isolation  . Self Harm History  Clinical Social Work Clinical Goal(s):  . Over the next 30 days, client will work with LCSW to address concerns related to client's anger at being placed at [name]  Interventions: . Talked with client about his feelings of anger at being placed at [name] . Talked with client about strategies for managing anger outbursts of client . Talked with client about social support network  . Talked with client about recreational activities of choice (watching birds, listening to music, watching favorite TV shows)  Patient Self Care Activities:  . Attends all scheduled provider appointments . Attends church or other social activities  Plan:  . Patient will call LCSW , as needed, to discuss client feelings of anger and managing feelings of anger  . Patient will use strategies discussed to help him manage anger outbursts . Patient will attend appointments as scheduled with psychiatrist. . Patient will communicate needs to care provider staff at [name] . LCSW to call client in next 3 weeks to discuss client management of anger issues regarding current placement  Initial goal documentation    .           Materials Provided: No  Follow Up Plan: LCSW to call client in next 3 weeks to discuss client management of anger issues regarding current placement  The patient verbalized understanding of instructions provided today and declined a print copy of patient instruction materials.   Laisa Larrick S.Zykira Matlack MSW, LCSW Licensed Clinical Social Worker [facility] [phone] 

## 2018-11-26 NOTE — Chronic Care Management (AMB) (Signed)
  Care Management Note   David Zamora is a 58 y.o. year old male who is a primary care patient of Dettinger, Fransisca Kaufmann, MD. The CM team was consulted for assistance with chronic disease management and care coordination.   I reached out to [name] by phone today.   Review of patient status, including review of consultants reports, relevant laboratory and other test results, and collaboration with appropriate care team members and the patient's provider was performed as part of comprehensive patient evaluation and provision of chronic care management services.   Social Determinants of Health: risk of social isolation; risk of tobacco use    Office Visit from 02/02/2017 in [place]  PHQ-9 Total Score  7     Goals    . Client will talk with LCSW about feelings of anger at being at [name] (pt-stated)     Current Barriers:  [name] Kitchen Mental Health Concerns  . Social Isolation  . Self Harm History  Clinical Social Work Clinical Goal(s):  [name] Kitchen Over the next 30 days, client will work with LCSW to address concerns related to client's anger at being placed at [facility]  Interventions: . Talked with client about his feelings of anger at being placed at [facility] . Talked with client about strategies for managing anger outbursts of client . Talked with client about social support network  . Talked with client about recreational activities of choice (watching birds, listening to music, watching favorite TV shows)  Patient Self Care Activities:  . Attends all scheduled provider appointments . Attends church or other social activities  Plan:  . Patient will call LCSW , as needed, to discuss client feelings of anger and managing feelings of anger  . Patient will use strategies discussed to help him manage anger outbursts . Patient will attend appointments as scheduled with psychiatrist. . Patient will communicate needs to care provider staff  at [facility] . LCSW to call client in next 3 weeks to discuss client management of anger issues regarding current placement  Initial goal documentation    .   Client's mother is in a local nursing home and client calls his mother regularly. Client said he is sleeping well. Client said he enjoys going on group home trips to local park . He enjoys watching TV programs of choice and enjoys playing bingo to relax. He said he is getting along fairly well with others at group home. He said he talks with [place] or Manuela Schwartz at group home to discuss issues of concern. LCSW has encouraged client to work on cooperating with caregivers at group home. He said he called his Guardian, [name], recently to talk with her about his placement. He said he would someday like to possibly move to a group home in the [place], [place] area to be close to his family. He said he talked to his Guardian about this.   Client has an appointment on 11/29/2018 at [facility] with Dr. Warrick Parisian.   Follow Up Plan: LCSW to call client in next 3 weeks to discuss client management of anger issues regarding current placement  [name].Saidi Santacroce MSW, LCSW Licensed Clinical Social Worker [facility] [phone]

## 2018-11-29 ENCOUNTER — Encounter: Payer: Self-pay | Admitting: Family Medicine

## 2018-11-29 ENCOUNTER — Other Ambulatory Visit: Payer: Self-pay

## 2018-11-29 ENCOUNTER — Ambulatory Visit (INDEPENDENT_AMBULATORY_CARE_PROVIDER_SITE_OTHER): Payer: Medicare Other | Admitting: Family Medicine

## 2018-11-29 VITALS — BP 131/77 | HR 84 | Temp 96.0°F | Ht 65.0 in | Wt 221.0 lb

## 2018-11-29 DIAGNOSIS — K219 Gastro-esophageal reflux disease without esophagitis: Secondary | ICD-10-CM

## 2018-11-29 DIAGNOSIS — N183 Chronic kidney disease, stage 3 unspecified: Secondary | ICD-10-CM

## 2018-11-29 DIAGNOSIS — E1159 Type 2 diabetes mellitus with other circulatory complications: Secondary | ICD-10-CM

## 2018-11-29 DIAGNOSIS — I10 Essential (primary) hypertension: Secondary | ICD-10-CM | POA: Diagnosis not present

## 2018-11-29 DIAGNOSIS — B353 Tinea pedis: Secondary | ICD-10-CM

## 2018-11-29 DIAGNOSIS — I152 Hypertension secondary to endocrine disorders: Secondary | ICD-10-CM

## 2018-11-29 DIAGNOSIS — E1169 Type 2 diabetes mellitus with other specified complication: Secondary | ICD-10-CM

## 2018-11-29 DIAGNOSIS — E781 Pure hyperglyceridemia: Secondary | ICD-10-CM

## 2018-11-29 LAB — BAYER DCA HB A1C WAIVED: HB A1C (BAYER DCA - WAIVED): 6.8 % (ref <7.0)

## 2018-11-29 MED ORDER — XULTOPHY 100-3.6 UNIT-MG/ML ~~LOC~~ SOPN: 50.0000 [IU] | PEN_INJECTOR | Freq: Every day | SUBCUTANEOUS | 3 refills | Status: DC

## 2018-11-29 MED ORDER — TOLNAFTATE 1 % EX AERP: INHALATION_SPRAY | Freq: Two times a day (BID) | CUTANEOUS | 3 refills | Status: AC

## 2018-11-29 MED ORDER — NOVOLOG FLEXPEN 100 UNIT/ML ~~LOC~~ SOPN: 10.0000 [IU] | PEN_INJECTOR | Freq: Three times a day (TID) | SUBCUTANEOUS | 11 refills | Status: DC

## 2018-11-29 NOTE — Progress Notes (Signed)
BP 131/77   Pulse 84   Temp (!) 96 F (35.6 C) (Temporal)   Ht '5\' 5"'  (1.651 m)   Wt 221 lb (100.2 kg)   BMI 36.78 kg/m    Subjective:   Patient ID: David Zamora, male    DOB: [birth year], 58 y.o.   MRN: [id number]  HPI: David Zamora is a 58 y.o. male presenting on 11/29/2018 for Diabetes (3 month follow up)   HPI Type 2 diabetes mellitus Patient comes in today for recheck of his diabetes. Patient has been currently taking metformin and glipizide and Xultophy. Patient is currently on an ACE inhibitor/ARB. Patient has not seen an ophthalmologist this year. Patient denies any issues with their feet.  Patient has CKD stage III and is being monitored closely.  Hypertension Patient is currently on lisinopril, and their blood pressure today is 131/77. Patient denies any lightheadedness or dizziness. Patient denies headaches, blurred vision, chest pains, shortness of breath, or weakness. Denies any side effects from medication and is content with current medication.   GERD Patient is currently on omeprazole.  She denies any major symptoms or abdominal pain or belching or burping. She denies any blood in her stool or lightheadedness or dizziness.   Patient has fungus on both feet and has been using Tinactin  Relevant past medical, surgical, family and social history reviewed and updated as indicated. Interim medical history since our last visit reviewed. Allergies and medications reviewed and updated.  Review of Systems  Constitutional: Negative for chills and fever.  Eyes: Negative for visual disturbance.  Respiratory: Negative for shortness of breath and wheezing.   Cardiovascular: Negative for chest pain and leg swelling.  Musculoskeletal: Negative for back pain and gait problem.  Skin: Negative for rash.  Neurological: Negative for dizziness, weakness and light-headedness.  All other systems reviewed and are negative.   Per HPI unless specifically indicated above    Allergies as of 11/29/2018      Reactions   Soap    White soaps      Medication List       Accurate as of November 29, 2018 11:49 AM. If you have any questions, ask your nurse or doctor.        blood glucose meter kit and supplies Kit Dispense based on patient and insurance preference. Use up to four times daily as directed. (FOR ICD-9 250.00, 250.01).   buPROPion 150 MG 24 hr tablet Commonly known as: WELLBUTRIN XL Take 150 mg by mouth every morning.   busPIRone 10 MG tablet Commonly known as: BUSPAR Take 10 mg by mouth 2 (two) times daily.   CeraVe Crea APPLY 1 APPLICATION ONCE A DAY TO BOTH FEET What changed: See the new instructions.   divalproex 250 MG 24 hr tablet Commonly known as: DEPAKOTE ER Take 250-1,000 mg by mouth See admin instructions. 264m in the morning and 10012m at bedtime   FORA Lancets Misc USE TO TEST BLOOD SUGAR 3 TIMES A DAY USE TO TEST BLOOD SUGAR AS NEEDED FOR SIGNS/SYMPTOMS OF HYPOGLYCEMIA (SHAKINESS, SWEATING, BLURRED VIS   FORA V30a Blood Glucose Test test strip Generic drug: glucose blood USE TO TEST BLOOD SUGAR 3 TIMES A DAY AS NEEDED FOR SIGNS/ SYMPTOMS OF HYPOGLYCEMIA SHAKINESS, SWEATING, BLURRED VISION. Dx E11.9   glipiZIDE 10 MG 24 hr tablet Commonly known as: GLUCOTROL XL TAKE (1) TABLET BY MOUTH ONCE DAILY. What changed: See the new instructions.   lisinopril 10 MG tablet Commonly known as: ZESTRIL  Take 10 mg by mouth daily.   metFORMIN 1000 MG tablet Commonly known as: GLUCOPHAGE TAKE 1 TABLET BY MOUTH TWICE DAILY WITH MEALS.   nicotine 7 mg/24hr patch Commonly known as: NICODERM CQ - dosed in mg/24 hr PLACE 1 PATCH ONTO THE SKIN DAILY.   NovoLOG FlexPen 100 UNIT/ML FlexPen Generic drug: insulin aspart Inject 10 Units into the skin 3 (three) times daily with meals. Started by: Fransisca Kaufmann Dettinger, MD   omeprazole 20 MG capsule Commonly known as: PRILOSEC TAKE 1 CAPSULE BY MOUTH ONCE DAILY.   PARoxetine 20 MG tablet  Commonly known as: PAXIL Take 20 mg by mouth every morning.   QUEtiapine 300 MG 24 hr tablet Commonly known as: SEROQUEL XR Take 300 mg by mouth daily. Taken daily at 4pm   Refresh Optive Advanced 0.5-1-0.5 % Soln Generic drug: Carboxymeth-Glycerin-Polysorb Apply 1 drop to eye 4 (four) times daily.   simvastatin 40 MG tablet Commonly known as: ZOCOR TAKE (1) TABLET BY MOUTH ONCE DAILY.   tamsulosin 0.4 MG Caps capsule Commonly known as: FLOMAX TAKE 1 CAPSULE BY MOUTH ONCE A DAY.   tolnaftate 1 % spray Commonly known as: TINACTIN Apply topically 2 (two) times daily. Started by: Fransisca Kaufmann Dettinger, MD   triamcinolone ointment 0.5 % Commonly known as: KENALOG APPLY TOPICALLY TWICE DAILY. What changed: how much to take   Vascepa 1 g Caps Generic drug: Icosapent Ethyl TAKE (2) CAPSULES BY MOUTH TWICE DAILY.   Vitamin D3 1.25 MG (50000 UT) Tabs Take 50,000 Units by mouth every Saturday.   Xultophy 100-3.6 UNIT-MG/ML Sopn Generic drug: Insulin Degludec-Liraglutide Inject 50 Units into the skin daily. Started by: Fransisca Kaufmann Dettinger, MD        Objective:   BP 131/77   Pulse 84   Temp (!) 96 F (35.6 C) (Temporal)   Ht '5\' 5"'  (1.651 m)   Wt 221 lb (100.2 kg)   BMI 36.78 kg/m   Wt Readings from Last 3 Encounters:  11/29/18 221 lb (100.2 kg)  05/26/18 221 lb 9.6 oz (100.5 kg)  05/21/18 219 lb 7 oz (99.5 kg)    Physical Exam Vitals signs and nursing note reviewed.  Constitutional:      General: He is not in acute distress.    Appearance: He is well-developed. He is not diaphoretic.  Eyes:     General: No scleral icterus.    Conjunctiva/sclera: Conjunctivae normal.  Neck:     Musculoskeletal: Neck supple.     Thyroid: No thyromegaly.  Cardiovascular:     Rate and Rhythm: Normal rate and regular rhythm.     Heart sounds: Normal heart sounds. No murmur.  Pulmonary:     Effort: Pulmonary effort is normal. No respiratory distress.     Breath sounds: Normal  breath sounds. No wheezing.  Lymphadenopathy:     Cervical: No cervical adenopathy.  Skin:    General: Skin is warm and dry.     Findings: No rash.  Neurological:     Mental Status: He is alert and oriented to person, place, and time.     Coordination: Coordination normal.  Psychiatric:        Behavior: Behavior normal.     Diabetic Foot Exam - Simple   Simple Foot Form Diabetic Foot exam was performed with the following findings: Yes 11/29/2018 11:49 AM  Visual Inspection See comments: Yes Sensation Testing Intact to touch and monofilament testing bilaterally: Yes Pulse Check Posterior Tibialis and Dorsalis pulse intact bilaterally: Yes  Comments Patient has a hammertoe on all of his toes except the 2 great toes on both feet and on his fifth digit on the right foot on top he has formed a callus because of the hammertoe on the knuckle joint.      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) - Primary   Relevant Medications   Insulin Degludec-Liraglutide (XULTOPHY) 100-3.6 UNIT-MG/ML SOPN   Other Relevant Orders   CMP14+EGFR (Completed)   Lipid panel (Completed)   Bayer DCA Hb A1c Waived (Completed)     Genitourinary   Chronic kidney disease (CKD), stage III (moderate) (HCC)   Relevant Orders   CMP14+EGFR (Completed)    Other Visit Diagnoses    Tinea pedis of both feet       Relevant Medications   tolnaftate (TINACTIN) 1 % spray    Added mealtime NovoLog 3 times a day started 10 units, already maxed out on xultophy and metformin His blood sugars have not been running well   Follow up plan: Return in about 3 months (around 03/01/2019), or if symptoms worsen or fail to improve, for diabetes.  Counseling provided for all of the vaccine components Orders Placed This Encounter  Procedures  . CBC with Differential/Platelet  . CMP14+EGFR  . Lipid panel  . [facility] Hb A1c [place], MD [facility] 11/29/2018, 11:49 AM

## 2018-11-30 LAB — CMP14+EGFR
ALT: 9 IU/L (ref 0–44)
AST: 14 IU/L (ref 0–40)
Albumin/Globulin Ratio: 2.1 (ref 1.2–2.2)
Albumin: 4.1 g/dL (ref 3.8–4.9)
Alkaline Phosphatase: 53 IU/L (ref 39–117)
BUN/Creatinine Ratio: 13 (ref 10–24)
BUN: 24 mg/dL (ref 8–27)
Bilirubin Total: <0.2 mg/dL (ref 0.0–1.2)
CO2: 25 mmol/L (ref 20–29)
Calcium: 9.5 mg/dL (ref 8.6–10.2)
Chloride: 105 mmol/L (ref 96–106)
Creatinine, Ser: 1.87 mg/dL — ABNORMAL HIGH (ref 0.76–1.27)
GFR calc Af Amer: 44 mL/min/{1.73_m2} — ABNORMAL LOW (ref >59)
GFR calc non Af Amer: 38 mL/min/{1.73_m2} — ABNORMAL LOW (ref >59)
Globulin, Total: 2 g/dL (ref 1.5–4.5)
Glucose: 49 mg/dL — ABNORMAL LOW (ref 65–99)
Potassium: 4.9 mmol/L (ref 3.5–5.2)
Sodium: 146 mmol/L — ABNORMAL HIGH (ref 134–144)
Total Protein: 6.1 g/dL (ref 6.0–8.5)

## 2018-11-30 LAB — CBC WITH DIFFERENTIAL/PLATELET
Basophils Absolute: 0 10*3/uL (ref 0.0–0.2)
Basos: 0 %
EOS (ABSOLUTE): 0.3 10*3/uL (ref 0.0–0.4)
Eos: 5 %
Hematocrit: 34.3 % — ABNORMAL LOW (ref 37.5–51.0)
Hemoglobin: 11.7 g/dL — ABNORMAL LOW (ref 13.0–17.7)
Immature Grans (Abs): 0.1 10*3/uL (ref 0.0–0.1)
Immature Granulocytes: 1 %
Lymphocytes Absolute: 2.9 10*3/uL (ref 0.7–3.1)
Lymphs: 48 %
MCH: 32.6 pg (ref 26.6–33.0)
MCHC: 34.1 g/dL (ref 31.5–35.7)
MCV: 96 fL (ref 79–97)
Monocytes Absolute: 0.5 10*3/uL (ref 0.1–0.9)
Monocytes: 8 %
Neutrophils Absolute: 2.3 10*3/uL (ref 1.4–7.0)
Neutrophils: 38 %
Platelets: 256 10*3/uL (ref 150–450)
RBC: 3.59 x10E6/uL — ABNORMAL LOW (ref 4.14–5.80)
RDW: 13.5 % (ref 11.6–15.4)
WBC: 6 10*3/uL (ref 3.4–10.8)

## 2018-11-30 LAB — LIPID PANEL
Chol/HDL Ratio: 4.2 ratio (ref 0.0–5.0)
Cholesterol, Total: 162 mg/dL (ref 100–199)
HDL: 39 mg/dL — ABNORMAL LOW (ref >39)
LDL Calculated: 70 mg/dL (ref 0–99)
Triglycerides: 265 mg/dL — ABNORMAL HIGH (ref 0–149)
VLDL Cholesterol Cal: 53 mg/dL — ABNORMAL HIGH (ref 5–40)

## 2018-12-02 ENCOUNTER — Telehealth: Payer: Self-pay | Admitting: Family Medicine

## 2018-12-02 ENCOUNTER — Encounter: Payer: Self-pay | Admitting: Family Medicine

## 2018-12-02 DIAGNOSIS — E781 Pure hyperglyceridemia: Secondary | ICD-10-CM

## 2018-12-02 DIAGNOSIS — E1169 Type 2 diabetes mellitus with other specified complication: Secondary | ICD-10-CM

## 2018-12-02 MED ORDER — NOVOLOG FLEXPEN 100 UNIT/ML ~~LOC~~ SOPN: 14.0000 [IU] | PEN_INJECTOR | Freq: Three times a day (TID) | SUBCUTANEOUS | 11 refills | Status: DC

## 2018-12-02 NOTE — Telephone Encounter (Signed)
Televisit done with Nephrologist.  Nephrologist stopped lisinopril to see if kidney functions will get better.  Notes in careeverywhere

## 2018-12-02 NOTE — Telephone Encounter (Signed)
Okay thanks for the information 

## 2018-12-02 NOTE — Telephone Encounter (Signed)
Lower his mealtime insulin that he gets for breakfast by 5 units, I believe he was at 19 units go down to 14, then keep me apprised as to what is happening.

## 2018-12-04 DIAGNOSIS — D631 Anemia in chronic kidney disease: Secondary | ICD-10-CM | POA: Insufficient documentation

## 2018-12-04 DIAGNOSIS — N189 Chronic kidney disease, unspecified: Secondary | ICD-10-CM | POA: Insufficient documentation

## 2018-12-13 DIAGNOSIS — F319 Bipolar disorder, unspecified: Secondary | ICD-10-CM | POA: Diagnosis not present

## 2018-12-21 ENCOUNTER — Telehealth: Payer: Self-pay

## 2019-01-04 ENCOUNTER — Ambulatory Visit (INDEPENDENT_AMBULATORY_CARE_PROVIDER_SITE_OTHER): Payer: Medicare Other | Admitting: Licensed Clinical Social Worker

## 2019-01-04 DIAGNOSIS — I10 Essential (primary) hypertension: Secondary | ICD-10-CM

## 2019-01-04 DIAGNOSIS — I152 Hypertension secondary to endocrine disorders: Secondary | ICD-10-CM

## 2019-01-04 DIAGNOSIS — K219 Gastro-esophageal reflux disease without esophagitis: Secondary | ICD-10-CM

## 2019-01-04 DIAGNOSIS — E781 Pure hyperglyceridemia: Secondary | ICD-10-CM | POA: Diagnosis not present

## 2019-01-04 DIAGNOSIS — F3341 Major depressive disorder, recurrent, in partial remission: Secondary | ICD-10-CM | POA: Diagnosis not present

## 2019-01-04 DIAGNOSIS — E1169 Type 2 diabetes mellitus with other specified complication: Secondary | ICD-10-CM

## 2019-01-04 DIAGNOSIS — F79 Unspecified intellectual disabilities: Secondary | ICD-10-CM

## 2019-01-04 DIAGNOSIS — E1159 Type 2 diabetes mellitus with other circulatory complications: Secondary | ICD-10-CM

## 2019-01-04 NOTE — Patient Instructions (Signed)
Licensed Clinical Social Worker Visit Information  Goals we discussed today:  Goals    . Client will talk with LCSW about feelings of anger at being at [name] (pt-stated)     Current Barriers:  [name] Kitchen Mental Health Concerns  . Social Isolation  . Self Harm History  Clinical Social Work Clinical Goal(s):  [name] Kitchen Over the next 30 days, client will work with LCSW to address concerns related to client's anger at being placed at [facility]  Interventions: . Talked with client about his feelings of anger at being placed at [facility] . Talked with client about strategies for managing anger outbursts of client . Talked with client about social support network  . Talked with client about recreational activities of choice (watching birds, listening to music, watching favorite TV shows)  . Talked with client about status of his mother and about status of his sister  Patient Self Care Activities:  . Attends all scheduled provider appointments . Attends church or other social activities  Plan:  . Patient will call LCSW , as needed, to discuss client feelings of anger and managing feelings of anger  . Patient will use strategies discussed to help him manage anger outbursts . Patient will attend appointments as scheduled with psychiatrist. . Patient will communicate needs to care provider staff at [facility] . LCSW to call client in next 3 weeks to discuss client management of anger issues regarding current placement  Initial goal documentation    .             Materials Provided: No  Follow Up Plan:  LCSW to call client in next 3 weeks to discuss client management of anger  issues regarding current placement  The patient verbalized understanding of instructions provided today and declined a print copy of patient instruction materials.   [name].Leilyn Frayre MSW, LCSW Licensed Clinical Social Worker [facility] Care  Management [phone]

## 2019-01-04 NOTE — Chronic Care Management (AMB) (Signed)
Care Management Note   David Zamora is a 58 y.o. year old male who is a primary care patient of Dettinger, Fransisca Kaufmann, MD. The CM team was consulted for assistance with chronic disease management and care coordination.   I reached out to David Zamora by phone today.     Review of patient status, including review of consultants reports, relevant laboratory and other test results, and collaboration with appropriate care team members and the patient's provider was performed as part of comprehensive patient evaluation and provision of chronic care management services.   Social Determinants of Health: risk of social isolation; risk of tobacco use risk of physical inactivity    Office Visit from 02/02/2017 in [place]  PHQ-9 Total Score  7     Medications   New medications from outside sources are available for reconciliation   blood glucose meter kit and supplies KIT    buPROPion (WELLBUTRIN XL) 150 MG 24 hr tablet    busPIRone (BUSPAR) 10 MG tablet    Carboxymeth-Glycerin-Polysorb (REFRESH OPTIVE ADVANCED) 0.5-1-0.5 % SOLN    Cholecalciferol (VITAMIN D3) 50000 units TABS    divalproex (DEPAKOTE ER) 250 MG 24 hr tablet    Emollient (CERAVE) CREA    FORA Lancets MISC    glipiZIDE (GLUCOTROL XL) 10 MG 24 hr tablet    glucose blood (FORA V30A BLOOD GLUCOSE TEST) test strip    insulin aspart (NOVOLOG FLEXPEN) 100 UNIT/ML FlexPen    Insulin Degludec-Liraglutide (XULTOPHY) 100-3.6 UNIT-MG/ML SOPN    lisinopril (PRINIVIL,ZESTRIL) 10 MG tablet    metFORMIN (GLUCOPHAGE) 1000 MG tablet    nicotine (NICODERM CQ - DOSED IN MG/24 HR) 7 mg/24hr patch    omeprazole (PRILOSEC) 20 MG capsule    PARoxetine (PAXIL) 20 MG tablet    QUEtiapine (SEROQUEL XR) 300 MG 24 hr tablet    simvastatin (ZOCOR) 40 MG tablet    tamsulosin (FLOMAX) 0.4 MG CAPS capsule    tolnaftate (TINACTIN) 1 % spray    triamcinolone ointment (KENALOG) 0.5 %    VASCEPA 1 g CAPS    Goals    . Client  will talk with LCSW about feelings of anger at being at [name] (pt-stated)     Current Barriers:  [name] Kitchen Mental Health Concerns  . Social Isolation  . Self Harm History  Clinical Social Work Clinical Goal(s):  [name] Kitchen Over the next 30 days, client will work with LCSW to address concerns related to client's anger at being placed at [facility]  Interventions: . Talked with client about his feelings of anger at being placed at [name] . Talked with client about strategies for managing anger outbursts of client . Talked with client about social support network  . Talked with client about recreational activities of choice (watching birds, listening to music, watching favorite TV shows)  . Talked with client about the status of his mother and of his sister  Patient Self Care Activities:  . Attends all scheduled provider appointments . Attends church or other social activities  Plan:  . Patient will call LCSW , as needed, to discuss client feelings of anger and managing feelings of anger  . Patient will use strategies discussed to help him manage anger outbursts . Patient will attend appointments as scheduled with psychiatrist. . Patient will communicate needs to care provider staff at [name] . LCSW to call client in next 3 weeks to discuss client management of anger issues regarding  current placement  Initial goal documentation      Follow Up Plan: LCSW to call client in next 3 weeks to talk with client about anger issues regarding current placement  David Zamora MSW, LCSW Licensed Clinical Social Worker [facility] [phone]

## 2019-01-25 DIAGNOSIS — B351 Tinea unguium: Secondary | ICD-10-CM | POA: Diagnosis not present

## 2019-01-25 DIAGNOSIS — E1142 Type 2 diabetes mellitus with diabetic polyneuropathy: Secondary | ICD-10-CM | POA: Diagnosis not present

## 2019-01-25 DIAGNOSIS — L851 Acquired keratosis [keratoderma] palmaris et plantaris: Secondary | ICD-10-CM | POA: Diagnosis not present

## 2019-01-26 ENCOUNTER — Telehealth: Payer: Self-pay

## 2019-01-27 ENCOUNTER — Ambulatory Visit (INDEPENDENT_AMBULATORY_CARE_PROVIDER_SITE_OTHER): Payer: Medicare Other | Admitting: Licensed Clinical Social Worker

## 2019-01-27 DIAGNOSIS — F79 Unspecified intellectual disabilities: Secondary | ICD-10-CM

## 2019-01-27 DIAGNOSIS — I10 Essential (primary) hypertension: Secondary | ICD-10-CM

## 2019-01-27 DIAGNOSIS — E1169 Type 2 diabetes mellitus with other specified complication: Secondary | ICD-10-CM

## 2019-01-27 DIAGNOSIS — K219 Gastro-esophageal reflux disease without esophagitis: Secondary | ICD-10-CM

## 2019-01-27 DIAGNOSIS — I152 Hypertension secondary to endocrine disorders: Secondary | ICD-10-CM

## 2019-01-27 DIAGNOSIS — F3341 Major depressive disorder, recurrent, in partial remission: Secondary | ICD-10-CM | POA: Diagnosis not present

## 2019-01-27 DIAGNOSIS — E781 Pure hyperglyceridemia: Secondary | ICD-10-CM | POA: Diagnosis not present

## 2019-01-27 DIAGNOSIS — E1159 Type 2 diabetes mellitus with other circulatory complications: Secondary | ICD-10-CM

## 2019-01-27 NOTE — Chronic Care Management (AMB) (Signed)
Care Management Note   David Zamora is a 58 y.o. year old male who is a primary care patient of Dettinger, Fransisca Kaufmann, MD. The CM team was consulted for assistance with chronic disease management and care coordination.   I reached out to David Zamora by phone today.   Review of patient status, including review of consultants reports, relevant laboratory and other test results, and collaboration with appropriate care team members and the patient's provider was performed as part of comprehensive patient evaluation and provision of chronic care management services.   Social Determinants of Health: risk of social isolation; risk of tobacco use; risk of physical inactivity    Office Visit from 02/02/2017 in [place]  PHQ-9 Total Score  7      Medications   New medications from outside sources are available for reconciliation   blood glucose meter kit and supplies KIT    buPROPion (WELLBUTRIN XL) 150 MG 24 hr tablet    busPIRone (BUSPAR) 10 MG tablet    Carboxymeth-Glycerin-Polysorb (REFRESH OPTIVE ADVANCED) 0.5-1-0.5 % SOLN    Cholecalciferol (VITAMIN D3) 50000 units TABS    divalproex (DEPAKOTE ER) 250 MG 24 hr tablet    Emollient (CERAVE) CREA    FORA Lancets MISC    glipiZIDE (GLUCOTROL XL) 10 MG 24 hr tablet    glucose blood (FORA V30A BLOOD GLUCOSE TEST) test strip    insulin aspart (NOVOLOG FLEXPEN) 100 UNIT/ML FlexPen    Insulin Degludec-Liraglutide (XULTOPHY) 100-3.6 UNIT-MG/ML SOPN    lisinopril (PRINIVIL,ZESTRIL) 10 MG tablet    metFORMIN (GLUCOPHAGE) 1000 MG tablet    nicotine (NICODERM CQ - DOSED IN MG/24 HR) 7 mg/24hr patch    omeprazole (PRILOSEC) 20 MG capsule    PARoxetine (PAXIL) 20 MG tablet    QUEtiapine (SEROQUEL XR) 300 MG 24 hr tablet    simvastatin (ZOCOR) 40 MG tablet    tamsulosin (FLOMAX) 0.4 MG CAPS capsule    tolnaftate (TINACTIN) 1 % spray    triamcinolone ointment (KENALOG) 0.5 %    VASCEPA 1 g CAPS     Goals    .  Client will talk with LCSW about feelings of anger at being at [name] (pt-stated)     Current Barriers:  [name] Kitchen Mental Health Concerns  . Social Isolation  . Self Harm History  Clinical Social Work Clinical Goal(s):  [name] Kitchen Over the next 30 days, client will work with LCSW to address concerns related to client's anger at being placed at [facility]  Interventions: . Talked with client about his feelings of anger at being placed at [facility] . Talked with client about strategies for managing anger outbursts of client . Talked with client about recreational activities of choice (watching birds, listening to music, watching favorite TV shows)  Talked with client about the status of his mother and of his sister  Talked with David Zamora about relationships with caregiver staff and with other group home residents  Patient Self Care Activities:  . Attends all scheduled provider appointments . Attends church or other social activities  Plan:  . Patient will call LCSW , as needed, to discuss client feelings of anger and managing feelings of anger  . Patient will use strategies discussed to help him manage anger outbursts . Patient will attend appointments as scheduled with psychiatrist. . Patient will communicate needs to care provider staff at [facility] . LCSW to call client in next 3 weeks to discuss client  management of anger issues regarding current placement  Initial goal documentation      Follow Up Plan: LCSW to call client in next 3 weeks to discuss client management of anger issues regarding current placement  [name].Rushil Kimbrell MSW, LCSW Licensed Clinical Social Worker [facility] [phone]

## 2019-01-27 NOTE — Patient Instructions (Signed)
Licensed Clinical Social Worker Visit Information  Goals we discussed today:  Goals    . Client will talk with LCSW about feelings of anger at being at [name] (pt-stated)     Current Barriers:  [name] Kitchen Mental Health Concerns  . Social Isolation  . Self Harm History  Clinical Social Work Clinical Goal(s):  [name] Kitchen Over the next 30 days, client will work with LCSW to address concerns related to client's anger at being placed at [place]  Interventions: . Talked with client about his feelings of anger at being placed at [place] . Talked with client about strategies for managing anger outbursts of client . Talked with client about recreational activities of choice (watching birds, listening to music, watching favorite TV shows) . Talked with Kasandra Knudsen about his relationships with caregiver staff and with residents at [facility]  Patient Self Care Activities:  . Attends all scheduled provider appointments . Attends church or other social activities  Plan:  . Patient will call LCSW , as needed, to discuss client feelings of anger and managing feelings of anger  . Patient will use strategies discussed to help him manage anger outbursts . Patient will attend appointments as scheduled with psychiatrist. . Patient will communicate needs to care provider staff at [facility] . LCSW to call client in next 3 weeks to discuss client management of anger issues regarding current placement  Initial goal documentation    .             Materials Provided: No  Follow Up Plan: LCSW to call client in next 3 weeks to discuss client management of anger issues regarding current placement  The patient verbalized understanding of instructions provided today and declined a print copy of patient instruction materials.   [name].Syrenity Klepacki MSW, LCSW Licensed Clinical Social Worker [facility] [phone]

## 2019-02-14 ENCOUNTER — Other Ambulatory Visit: Payer: Self-pay | Admitting: Family Medicine

## 2019-02-17 ENCOUNTER — Ambulatory Visit (INDEPENDENT_AMBULATORY_CARE_PROVIDER_SITE_OTHER): Payer: Medicare Other | Admitting: Licensed Clinical Social Worker

## 2019-02-17 DIAGNOSIS — I10 Essential (primary) hypertension: Secondary | ICD-10-CM

## 2019-02-17 DIAGNOSIS — F3341 Major depressive disorder, recurrent, in partial remission: Secondary | ICD-10-CM

## 2019-02-17 DIAGNOSIS — F79 Unspecified intellectual disabilities: Secondary | ICD-10-CM

## 2019-02-17 DIAGNOSIS — K219 Gastro-esophageal reflux disease without esophagitis: Secondary | ICD-10-CM

## 2019-02-17 DIAGNOSIS — E1159 Type 2 diabetes mellitus with other circulatory complications: Secondary | ICD-10-CM

## 2019-02-17 DIAGNOSIS — I152 Hypertension secondary to endocrine disorders: Secondary | ICD-10-CM

## 2019-02-17 DIAGNOSIS — E781 Pure hyperglyceridemia: Secondary | ICD-10-CM

## 2019-02-17 DIAGNOSIS — E1169 Type 2 diabetes mellitus with other specified complication: Secondary | ICD-10-CM

## 2019-02-17 NOTE — Chronic Care Management (AMB) (Signed)
Care Management Note   David Zamora is a 58 y.o. year old male who is a primary care patient of Dettinger, Fransisca Kaufmann, MD. The CM team was consulted for assistance with chronic disease management and care coordination.   I reached out to [name] by phone today.    Review of patient status, including review of consultants reports, relevant laboratory and other test results, and collaboration with appropriate care team members and the patient's provider was performed as part of comprehensive patient evaluation and provision of chronic care management services.   Social determinants of health: risk of social isolation; risk of tobacco use; risk of physical inactivity    Office Visit from 02/02/2017 in [place]  PHQ-9 Total Score  7     Medications   New medications from outside sources are available for reconciliation   blood glucose meter kit and supplies KIT    buPROPion (WELLBUTRIN XL) 150 MG 24 hr tablet    busPIRone (BUSPAR) 10 MG tablet    Carboxymeth-Glycerin-Polysorb (REFRESH OPTIVE ADVANCED) 0.5-1-0.5 % SOLN    Cholecalciferol (VITAMIN D3) 50000 units TABS    divalproex (DEPAKOTE ER) 250 MG 24 hr tablet    Emollient (CERAVE) CREA    FORA Lancets MISC    glipiZIDE (GLUCOTROL XL) 10 MG 24 hr tablet    glucose blood (FORA V30A BLOOD GLUCOSE TEST) test strip    insulin aspart (NOVOLOG FLEXPEN) 100 UNIT/ML FlexPen    Insulin Degludec-Liraglutide (XULTOPHY) 100-3.6 UNIT-MG/ML SOPN    lisinopril (ZESTRIL) 10 MG tablet    metFORMIN (GLUCOPHAGE) 1000 MG tablet    nicotine (NICODERM CQ - DOSED IN MG/24 HR) 7 mg/24hr patch    omeprazole (PRILOSEC) 20 MG capsule    PARoxetine (PAXIL) 20 MG tablet    QUEtiapine (SEROQUEL XR) 300 MG 24 hr tablet    simvastatin (ZOCOR) 40 MG tablet    tamsulosin (FLOMAX) 0.4 MG CAPS capsule    tolnaftate (TINACTIN) 1 % spray    triamcinolone ointment (KENALOG) 0.5 %    VASCEPA 1 g CAPS    Vitamin D, Ergocalciferol,  (DRISDOL) 1.25 MG (50000 UT) CAPS capsule    Goals    . Client will talk with LCSW about feelings of anger at being at [name] (pt-stated)     Current Barriers:  [name] Kitchen Mental Health Concerns  . Social Isolation  . Self Harm History  Clinical Social Work Clinical Goal(s):  [name] Kitchen Over the next 30 days, client will work with LCSW to address concerns related to client's anger at being placed at [place]  Interventions: . Talked with client about his feelings of anger at being placed at [place] . Talked previously with client about strategies for managing anger outbursts of client . Talked with client about his contact with his mother and with his sister   . Talked with client about recreational activities of choice (watching birds, listening to music, watching favorite TV shows) . Talked with client about his relationships with other residents at group home   Patient Self Care Activities:  . Attends all scheduled provider appointments . Attends church or other social activities  Plan:  . Patient will call LCSW , as needed, to discuss client feelings of anger and managing feelings of anger  . Patient will use strategies discussed to help him manage anger outbursts . Patient will attend appointments as scheduled with psychiatrist. . Patient will communicate needs to care provider staff at [name] . LCSW to call client in next 3 weeks to discuss client management of anger issues regarding current placement  Initial goal documentation      Follow Up Plan:  LCSW to call client in next 3 weeks to discuss client management of anger issues regarding current placement  [name].Alesana Magistro MSW, LCSW Licensed Clinical Social Worker [facility] [phone]

## 2019-02-17 NOTE — Patient Instructions (Addendum)
Licensed Clinical Social Worker Visit Information  Goals we discussed today:  Goals    . Client will talk with LCSW about feelings of anger at being at [name] (pt-stated)     Current Barriers:  [name] Kitchen Mental Health Concerns  . Social Isolation  . Self Harm History  Clinical Social Work Clinical Goal(s):  [name] Kitchen Over the next 30 days, client will work with LCSW to address concerns related to client's anger at being placed at [place]  Interventions: . Talked previously with client about his feelings of anger at being placed at [place] . Talked previously with client about strategies for managing anger outbursts of client . Talked with client about his contact with his mother and with his sister  . Talked with client about recreational activities of choice (watching birds, listening to music, watching favorite TV shows)   Talked with client about his relationships with other residents at group home   Patient Self Care Activities:  . Attends all scheduled provider appointments . Attends church or other social activities  Plan:  . Patient will call LCSW , as needed, to discuss client feelings of anger and managing feelings of anger  . Patient will use strategies discussed to help him manage anger outbursts . Patient will attend appointments as scheduled with psychiatrist. . Patient will communicate needs to care provider staff at [place] . LCSW to call client in next 3 weeks to discuss client management of anger issues regarding current placement  Initial goal documentation      Materials Provided: No  Follow Up Plan: LCSW to call client in next 3 weeks to discuss client management of anger issues over current placement  The patient verbalized understanding of instructions provided today and declined a print copy of patient instruction materials.   [name].Mirai Greenwood MSW, LCSW Licensed Clinical Social Worker [facility] Care [facility] [phone]

## 2019-03-01 ENCOUNTER — Other Ambulatory Visit: Payer: Self-pay

## 2019-03-02 ENCOUNTER — Ambulatory Visit (INDEPENDENT_AMBULATORY_CARE_PROVIDER_SITE_OTHER): Payer: Medicare Other

## 2019-03-02 DIAGNOSIS — Z23 Encounter for immunization: Secondary | ICD-10-CM

## 2019-03-09 ENCOUNTER — Other Ambulatory Visit: Payer: Self-pay | Admitting: Family Medicine

## 2019-03-15 ENCOUNTER — Ambulatory Visit (INDEPENDENT_AMBULATORY_CARE_PROVIDER_SITE_OTHER): Payer: Medicare Other | Admitting: Licensed Clinical Social Worker

## 2019-03-15 DIAGNOSIS — E781 Pure hyperglyceridemia: Secondary | ICD-10-CM

## 2019-03-15 DIAGNOSIS — F79 Unspecified intellectual disabilities: Secondary | ICD-10-CM

## 2019-03-15 DIAGNOSIS — I10 Essential (primary) hypertension: Secondary | ICD-10-CM | POA: Diagnosis not present

## 2019-03-15 DIAGNOSIS — I152 Hypertension secondary to endocrine disorders: Secondary | ICD-10-CM

## 2019-03-15 DIAGNOSIS — E1159 Type 2 diabetes mellitus with other circulatory complications: Secondary | ICD-10-CM

## 2019-03-15 DIAGNOSIS — F3341 Major depressive disorder, recurrent, in partial remission: Secondary | ICD-10-CM | POA: Diagnosis not present

## 2019-03-15 DIAGNOSIS — K219 Gastro-esophageal reflux disease without esophagitis: Secondary | ICD-10-CM

## 2019-03-15 DIAGNOSIS — E1169 Type 2 diabetes mellitus with other specified complication: Secondary | ICD-10-CM

## 2019-03-15 NOTE — Chronic Care Management (AMB) (Signed)
Care Management Note   David Zamora is a 58 y.o. year old male who is a primary care patient of Dettinger, Fransisca Kaufmann, MD. The CM team was consulted for assistance with chronic disease management and care coordination.   I reached out to [name] by phone today.    Review of patient status, including review of consultants reports, relevant laboratory and other test results, and collaboration with appropriate care team members and the patient's provider was performed as part of comprehensive patient evaluation and provision of chronic care management services.   Social determinants of health: risk of social isolation; risk of tobacco use; risk of physical inactivity    Office Visit from 02/02/2017 in [place]  PHQ-9 Total Score  7     Medications   New medications from outside sources are available for reconciliation   blood glucose meter kit and supplies KIT    buPROPion (WELLBUTRIN XL) 150 MG 24 hr tablet    busPIRone (BUSPAR) 10 MG tablet    Carboxymeth-Glycerin-Polysorb (REFRESH OPTIVE ADVANCED) 0.5-1-0.5 % SOLN    Cholecalciferol (VITAMIN D3) 50000 units TABS    divalproex (DEPAKOTE ER) 250 MG 24 hr tablet    Emollient (CERAVE) CREA    FORA Lancets MISC    glipiZIDE (GLUCOTROL XL) 10 MG 24 hr tablet    glucose blood (FORA V30A BLOOD GLUCOSE TEST) test strip    insulin aspart (NOVOLOG FLEXPEN) 100 UNIT/ML FlexPen    Insulin Degludec-Liraglutide (XULTOPHY) 100-3.6 UNIT-MG/ML SOPN    lisinopril (ZESTRIL) 10 MG tablet    metFORMIN (GLUCOPHAGE) 1000 MG tablet    nicotine (NICODERM CQ - DOSED IN MG/24 HR) 7 mg/24hr patch    omeprazole (PRILOSEC) 20 MG capsule    PARoxetine (PAXIL) 20 MG tablet    QUEtiapine (SEROQUEL XR) 300 MG 24 hr tablet    simvastatin (ZOCOR) 40 MG tablet    tamsulosin (FLOMAX) 0.4 MG CAPS capsule    tolnaftate (TINACTIN) 1 % spray    triamcinolone ointment (KENALOG) 0.5 %    VASCEPA 1 g CAPS    Vitamin D, Ergocalciferol,  (DRISDOL) 1.25 MG (50000 UT) CAPS capsule    Goals    . Client will talk with LCSW about feelings of anger at being at [name] (pt-stated)     Current Barriers:  [name] Kitchen Mental Health Concerns  . Social Isolation  . Self Harm History  Clinical Social Work Clinical Goal(s):  [name] Kitchen Over the next 30 days, client will work with LCSW to address concerns related to client's anger at being placed at [place]  Interventions: . Talked with client about his feelings of anger at being placed at [place] . Talked with client about strategies for managing anger outbursts of client . Talked with client about social support network (sister,mother) . Talked with client about recreational activities of choice (watching birds, listening to music, watching favorite TV shows, take a short walk)  . Talked with client about the needs of his mother . Talked with client about his doing art drawings   Patient Self Care Activities:  . Attends all scheduled provider appointments . Attends church or other social activities  Plan:  . Patient will call LCSW , as needed, to discuss client feelings of anger and managing feelings of anger  . Patient will use strategies discussed to help him manage anger outbursts . Patient will attend appointments as scheduled with psychiatrist. . Patient will communicate needs to care provider  staff at [facility] . LCSW to call client in next 3 weeks to discuss client management of anger issues regarding current placement  Initial goal documentation    Follow Up Plan: LCSW to call client in next 3 weeks to discuss client management of anger issues regarding current placement  [name]. MSW, LCSW Licensed Clinical Social Worker [facility] [phone]

## 2019-03-15 NOTE — Patient Instructions (Addendum)
Licensed Clinical Social Worker Visit Information  Goals we discussed today:  Goals    . Client will talk with LCSW about feelings of anger at being at [name] (pt-stated)     Current Barriers:  [name] Kitchen Mental Health Concerns  . Social Isolation  . Self Harm History  Clinical Social Work Clinical Goal(s):  [name] Kitchen Over the next 30 days, client will work with LCSW to address concerns related to client's anger at being placed at [place]  Interventions: . Talked with client about his feelings of anger at being placed at [place] . Talked with client about strategies for managing anger outbursts of client . Talked with client about social support network (sister, mother) . Talked with client about recreational activities of choice (watching birds, listening to music, watching favorite TV shows)   Talked with client about the needs of his mother  Talked with client about his doing art drawings  Patient Self Care Activities:  . Attends all scheduled provider appointments . Attends church or other social activities  Plan:  . Patient will call LCSW , as needed, to discuss client feelings of anger and managing feelings of anger  . Patient will use strategies discussed to help him manage anger outbursts . Patient will attend appointments as scheduled with psychiatrist. . Patient will communicate needs to care provider staff at [place] . LCSW to call client in next 3 weeks to discuss client management of anger issues regarding current placement  Initial goal documentation        Materials Provided: No  Follow Up Plan: LCSW to call client in next 3 weeks to discuss client management of anger issues regarding current placement  The patient verbalized understanding of instructions provided today and declined a print copy of patient instruction materials.   [name].Kelsen Celona MSW, LCSW Licensed Clinical Social Worker [facility] [phone]

## 2019-04-05 ENCOUNTER — Ambulatory Visit: Payer: Medicare Other | Admitting: Licensed Clinical Social Worker

## 2019-04-05 DIAGNOSIS — E781 Pure hyperglyceridemia: Secondary | ICD-10-CM

## 2019-04-05 DIAGNOSIS — F3341 Major depressive disorder, recurrent, in partial remission: Secondary | ICD-10-CM

## 2019-04-05 DIAGNOSIS — I152 Hypertension secondary to endocrine disorders: Secondary | ICD-10-CM

## 2019-04-05 DIAGNOSIS — E1169 Type 2 diabetes mellitus with other specified complication: Secondary | ICD-10-CM

## 2019-04-05 DIAGNOSIS — E1159 Type 2 diabetes mellitus with other circulatory complications: Secondary | ICD-10-CM

## 2019-04-05 DIAGNOSIS — K219 Gastro-esophageal reflux disease without esophagitis: Secondary | ICD-10-CM

## 2019-04-05 NOTE — Patient Instructions (Addendum)
Licensed Clinical Social Worker Visit Information  Goals we discussed today:  Goals Addressed            This Visit's Progress   . Client will talk with LCSW about feelings of anger at being at [name] (pt-stated)       Current Barriers:  [name] Kitchen Mental Health Concerns in patient with Chronic Diagnoses of CKD, Type 2 DM, HTN, GERD, and Depression . Social Isolation  . Self Harm History  Clinical Social Work Clinical Goal(s):  [name] Kitchen Over the next 30 days, client will work with LCSW to address concerns related to client's anger at being placed at [place]  Interventions: Previously talked with client about his feelings of anger at being placed at [place]  Previously talked with client about strategies for managing anger outbursts of client  Provided counseling support for client  Talked with client about recreational activities of choice (watching birds, listening to music, watching favorite TV shows, take a short walk)  Talked with client about the needs of his mother  Talked with client about his doing art drawings  Patient Self Care Activities:  . Attends all scheduled provider appointments . Attends church or other social activities  Plan:  . Patient will call LCSW , as needed, to discuss client feelings of anger and managing feelings of anger  . Patient will use strategies discussed to help him manage anger outbursts . Patient will attend appointments as scheduled with psychiatrist. . Patient will communicate needs to care provider staff at [facility] [place] . LCSW to call client in next 4 weeks to discuss client management of anger issues regarding current placement  Initial goal documentation        Materials Provided: No  Follow Up Plan: LCSW to call client in next 4 weeks to discuss client management of anger issues regarding current placement  The patient verbalized understanding of instructions provided today and declined a print copy of  patient instruction materials.   [name].Lalita Ebel MSW, LCSW Licensed Clinical Social Worker [facility] [phone]

## 2019-04-05 NOTE — Chronic Care Management (AMB) (Signed)
Care Management Note   David Zamora is a 58 y.o. year old male who is a primary care patient of Dettinger, Fransisca Kaufmann, MD. The CM team was consulted for assistance with chronic disease management and care coordination.   I reached out to [name] by phone today.     Review of patient status, including review of consultants reports, relevant laboratory and other test results, and collaboration with appropriate care team members and the patient's provider was performed as part of comprehensive patient evaluation and provision of chronic care management services.   Social determinants of health: risk of social isolation; risk of tobacco use; risk of physical inactivity    Office Visit from 02/02/2017 in [facility]  PHQ-9 Total Score  7     Medications   (very important)  New medications from outside sources are available for reconciliation  blood glucose meter kit and supplies KIT buPROPion (WELLBUTRIN XL) 150 MG 24 hr tablet busPIRone (BUSPAR) 10 MG tablet Carboxymeth-Glycerin-Polysorb (REFRESH OPTIVE ADVANCED) 0.5-1-0.5 % SOLN Cholecalciferol (VITAMIN D3) 50000 units TABS divalproex (DEPAKOTE ER) 250 MG 24 hr tablet Emollient (CERAVE) CREA FORA Lancets MISC glipiZIDE (GLUCOTROL XL) 10 MG 24 hr tablet glucose blood (FORA V30A BLOOD GLUCOSE TEST) test strip insulin aspart (NOVOLOG FLEXPEN) 100 UNIT/ML FlexPen Insulin Degludec-Liraglutide (XULTOPHY) 100-3.6 UNIT-MG/ML SOPN lisinopril (ZESTRIL) 10 MG tablet metFORMIN (GLUCOPHAGE) 1000 MG tablet nicotine (NICODERM CQ - DOSED IN MG/24 HR) 7 mg/24hr patch omeprazole (PRILOSEC) 20 MG capsule PARoxetine (PAXIL) 20 MG tablet QUEtiapine (SEROQUEL XR) 300 MG 24 hr tablet simvastatin (ZOCOR) 40 MG tablet tamsulosin (FLOMAX) 0.4 MG CAPS capsule tolnaftate (TINACTIN) 1 % spray triamcinolone ointment (KENALOG) 0.5 % VASCEPA 1 g CAPS Vitamin D, Ergocalciferol, (DRISDOL) 1.25 MG (50000 UT) CAPS  capsule   Goals Addressed            This Visit's Progress   . Client will talk with LCSW about feelings of anger at being at [name] (pt-stated)       Current Barriers:  [name] Kitchen Mental Health Concerns in patient with Chronic Diagnoses of CKD, Type 2 DM, HTN, GERD, and Depression . Social Isolation  . Self Harm History  Clinical Social Work Clinical Goal(s):  [name] Kitchen Over the next 30 days, client will work with LCSW to address concerns related to client's anger at being placed at [place]  Interventions:  Previously talked with client about his feelings of anger at being placed at [facility]  Previously talked with client about strategies for managing anger outbursts of client  Provided counseling support for client  Talked with client about recreational activities of choice (watching birds, listening to music, watching favorite TV shows, take a short walk)   Talked with client about the needs of his mother  Talked with client about his doing art drawings   Patient Self Care Activities:  . Attends all scheduled provider appointments . Attends church or other social activities  Plan:  . Patient will call LCSW , as needed, to discuss client feelings of anger and managing feelings of anger  . Patient will use strategies discussed to help him manage anger outbursts . Patient will attend appointments as scheduled with psychiatrist. . Patient will communicate needs to care provider staff at [place] . LCSW to call client in next 4 weeks to discuss client management of anger issues regarding current placement  Initial goal documentation      Follow Up Plan: LCSW to call  client in next 4 weeks to discuss client management of anger issues regarding current placement  [name].Diva Lemberger MSW, LCSW Licensed Clinical Social Worker [facility] [phone]

## 2019-04-11 ENCOUNTER — Ambulatory Visit (INDEPENDENT_AMBULATORY_CARE_PROVIDER_SITE_OTHER): Payer: Medicare Other | Admitting: Nurse Practitioner

## 2019-04-11 DIAGNOSIS — R630 Anorexia: Secondary | ICD-10-CM

## 2019-04-11 NOTE — Progress Notes (Signed)
   Virtual Visit via telephone Note Due to COVID-19 pandemic this visit was conducted virtually. This visit type was conducted due to national recommendations for restrictions regarding the COVID-19 Pandemic (e.g. social distancing, sheltering in place) in an effort to limit this patient's exposure and mitigate transmission in our community. All issues noted in this document were discussed and addressed.  A physical exam was not performed with this format.  I connected with David Zamora on 04/11/19 at 11:00 by telephone and verified that I am speaking with the correct person using two identifiers. David Zamora is currently located at group home and care givers are currently with him during visit. The provider, Mary-Margaret Hassell Done, FNP is located in their office at time of visit.  I discussed the limitations, risks, security and privacy concerns of performing an evaluation and management service by telephone and the availability of in person appointments. I also discussed with the patient that there may be a patient responsible charge related to this service. The patient expressed understanding and agreed to proceed.   History and Present Illness:   Chief Complaint: Anorexia   HPI: [facility] Patient calls in c/o feeling weak with decrease appetite. Started 3 days ago. He has been having daily bowel movements. He denies any weight loss. Has a slight headache. Caregivers do not know if his appetite has been poor or not.    Review of Systems  Constitutional: Negative.  Negative for fever.  Respiratory: Negative.   Cardiovascular: Negative.   Gastrointestinal: Negative for abdominal pain, constipation, diarrhea, nausea and vomiting.  Musculoskeletal: Negative.   Neurological: Negative.   All other systems reviewed and are negative.    Observations/Objective: Alert and oriented- answers all questions appropriately No distress    Assessment and Plan: David Zamora  in today with chief complaint of Anorexia   1. Decreased appetite Group home will watch appetitie miralax daily for bowel movements   Follow Up Instructions: prn    I discussed the assessment and treatment plan with the patient. The patient was provided an opportunity to ask questions and all were answered. The patient agreed with the plan and demonstrated an understanding of the instructions.   The patient was advised to call back or seek an in-person evaluation if the symptoms worsen or if the condition fails to improve as anticipated.  The above assessment and management plan was discussed with the patient. The patient verbalized understanding of and has agreed to the management plan. Patient is aware to call the clinic if symptoms persist or worsen. Patient is aware when to return to the clinic for a follow-up visit. Patient educated on when it is appropriate to go to the emergency department.   Time call ended:  11:15  I provided 15 minutes of non-face-to-face time during this encounter.    Mary-Margaret Hassell Done, FNP

## 2019-04-12 ENCOUNTER — Other Ambulatory Visit: Payer: Self-pay | Admitting: Family Medicine

## 2019-04-20 ENCOUNTER — Telehealth: Payer: Self-pay | Admitting: Family Medicine

## 2019-04-20 NOTE — Telephone Encounter (Signed)
Letter faxed.

## 2019-04-20 NOTE — Telephone Encounter (Signed)
Yes I agree that we can go ahead and do a letter that patient is eligible for the Covid vaccination and with the current information that we have about the Covid vaccination that the benefits would outweigh the risks for him Caryl Pina, MD [place] 04/20/2019, 12:55 PM

## 2019-04-25 ENCOUNTER — Other Ambulatory Visit: Payer: Self-pay

## 2019-04-25 ENCOUNTER — Other Ambulatory Visit: Payer: Medicare Other

## 2019-04-25 DIAGNOSIS — E1169 Type 2 diabetes mellitus with other specified complication: Secondary | ICD-10-CM

## 2019-04-25 DIAGNOSIS — E781 Pure hyperglyceridemia: Secondary | ICD-10-CM | POA: Diagnosis not present

## 2019-04-26 DIAGNOSIS — F319 Bipolar disorder, unspecified: Secondary | ICD-10-CM | POA: Diagnosis not present

## 2019-04-26 LAB — MICROALBUMIN / CREATININE URINE RATIO
Creatinine, Urine: 112.6 mg/dL
Microalb/Creat Ratio: 1073 mg/g creat — ABNORMAL HIGH (ref 0–29)
Microalbumin, Urine: 1208.5 ug/mL

## 2019-04-28 ENCOUNTER — Other Ambulatory Visit: Payer: Self-pay | Admitting: Family Medicine

## 2019-05-04 ENCOUNTER — Other Ambulatory Visit: Payer: Medicare Other

## 2019-05-04 ENCOUNTER — Other Ambulatory Visit: Payer: Self-pay

## 2019-05-04 DIAGNOSIS — E875 Hyperkalemia: Secondary | ICD-10-CM | POA: Diagnosis not present

## 2019-05-04 DIAGNOSIS — N189 Chronic kidney disease, unspecified: Secondary | ICD-10-CM | POA: Diagnosis not present

## 2019-05-04 DIAGNOSIS — E1129 Type 2 diabetes mellitus with other diabetic kidney complication: Secondary | ICD-10-CM | POA: Diagnosis not present

## 2019-05-04 DIAGNOSIS — D631 Anemia in chronic kidney disease: Secondary | ICD-10-CM | POA: Diagnosis not present

## 2019-05-04 DIAGNOSIS — R809 Proteinuria, unspecified: Secondary | ICD-10-CM | POA: Diagnosis not present

## 2019-05-04 DIAGNOSIS — E1122 Type 2 diabetes mellitus with diabetic chronic kidney disease: Secondary | ICD-10-CM | POA: Diagnosis not present

## 2019-05-04 DIAGNOSIS — E559 Vitamin D deficiency, unspecified: Secondary | ICD-10-CM | POA: Diagnosis not present

## 2019-05-05 ENCOUNTER — Ambulatory Visit (INDEPENDENT_AMBULATORY_CARE_PROVIDER_SITE_OTHER): Payer: Medicare Other | Admitting: Licensed Clinical Social Worker

## 2019-05-05 DIAGNOSIS — K219 Gastro-esophageal reflux disease without esophagitis: Secondary | ICD-10-CM

## 2019-05-05 DIAGNOSIS — E781 Pure hyperglyceridemia: Secondary | ICD-10-CM

## 2019-05-05 DIAGNOSIS — E1159 Type 2 diabetes mellitus with other circulatory complications: Secondary | ICD-10-CM | POA: Diagnosis not present

## 2019-05-05 DIAGNOSIS — F3341 Major depressive disorder, recurrent, in partial remission: Secondary | ICD-10-CM | POA: Diagnosis not present

## 2019-05-05 DIAGNOSIS — I152 Hypertension secondary to endocrine disorders: Secondary | ICD-10-CM

## 2019-05-05 DIAGNOSIS — E1169 Type 2 diabetes mellitus with other specified complication: Secondary | ICD-10-CM | POA: Diagnosis not present

## 2019-05-05 DIAGNOSIS — I10 Essential (primary) hypertension: Secondary | ICD-10-CM

## 2019-05-05 NOTE — Chronic Care Management (AMB) (Signed)
Care Management Note   David Zamora is a 59 y.o. year old male who is a primary care patient of Dettinger, Fransisca Kaufmann, MD. The CM team was consulted for assistance with chronic disease management and care coordination.   I reached out to [name] by phone today.    Review of patient status, including review of consultants reports, relevant laboratory and other test results, and collaboration with appropriate care team members and the patient's provider was performed as part of comprehensive patient evaluation and provision of chronic care management services.   Social determinants of health: risk of social isolation; risk of tobacco use; risk of physical inactivity; risk of depression    Office Visit from 02/02/2017 in [place]  PHQ-9 Total Score  7     Medications   (very important)  New medications from outside sources are available for reconciliation  blood glucose meter kit and supplies KIT buPROPion (WELLBUTRIN XL) 150 MG 24 hr tablet busPIRone (BUSPAR) 10 MG tablet Carboxymeth-Glycerin-Polysorb (REFRESH OPTIVE ADVANCED) 0.5-1-0.5 % SOLN Cholecalciferol (VITAMIN D3) 50000 units TABS divalproex (DEPAKOTE ER) 250 MG 24 hr tablet Emollient (CERAVE) CREA FORA Lancets MISC glipiZIDE (GLUCOTROL XL) 10 MG 24 hr tablet glucose blood (FORA V30A BLOOD GLUCOSE TEST) test strip insulin aspart (NOVOLOG FLEXPEN) 100 UNIT/ML FlexPen Insulin Degludec-Liraglutide (XULTOPHY) 100-3.6 UNIT-MG/ML SOPN Insulin Pen Needle (TRUEPLUS PEN NEEDLES) 31G X 8 MM MISC lisinopril (ZESTRIL) 10 MG tablet metFORMIN (GLUCOPHAGE) 1000 MG tablet nicotine (NICODERM CQ - DOSED IN MG/24 HR) 7 mg/24hr patch omeprazole (PRILOSEC) 20 MG capsule PARoxetine (PAXIL) 20 MG tablet QUEtiapine (SEROQUEL XR) 300 MG 24 hr tablet simvastatin (ZOCOR) 40 MG tablet tamsulosin (FLOMAX) 0.4 MG CAPS capsule tolnaftate (TINACTIN) 1 % spray triamcinolone ointment (KENALOG) 0.5 % VASCEPA 1 g  CAPS Vitamin D, Ergocalciferol, (DRISDOL) 1.25 MG (50000 UT) CAPS capsule  Goals    . Client will talk with LCSW about feelings of anger at being at [name] (pt-stated)     Current Barriers:  [name] Kitchen Mental Health Concerns in patient with Chronic Diagnoses of CKD, Type 2 DM, HTN, GERD, and Depression . Social Isolation  . Self Harm History  Clinical Social Work Clinical Goal(s):  [name] Kitchen Over the next 30 days, client will work with LCSW to address concerns related to client's anger at being placed at [place]  Interventions:  Previously talked with client about his feelings of anger at being placed at [place]  Previously talked with client about strategies for managing anger outbursts of client  Provided counseling support for client  Talked with client about recreational activities of choice (watching birds, watching favorite TV shows, take a short walk,listens to music on his radio))   Talked with client about the needs of his mother  Talked with client about his doing art drawings  Patient Self Care Activities:  . Attends all scheduled provider appointments . Attends church or other social activities  Plan:  . Patient will call LCSW , as needed, to discuss client feelings of anger and managing feelings of anger  . Patient will use strategies discussed to help him manage anger outbursts . Patient will attend appointments as scheduled with psychiatrist. . Patient will communicate needs to care provider staff at [facility] . LCSW to call client in next 4 weeks to discuss client management of anger issues regarding current placement  Initial goal documentation     Follow Up Plan: LCSW to call client in next 4  weeks to talk with client about   client management of anger issues regarding current placement  [name].Rogerick Baldwin MSW, LCSW Licensed Clinical Social Worker [facility] [phone]

## 2019-05-05 NOTE — Patient Instructions (Addendum)
Licensed Clinical Social Worker Visit Information  Goals we discussed today:  Goals    . Client will talk with LCSW about feelings of anger at being at [name] (pt-stated)     Current Barriers:  [name] Kitchen Mental Health Concerns in patient with Chronic Diagnoses of CKD, Type 2 DM, HTN, GERD, and Depression . Social Isolation  . Self Harm History  Clinical Social Work Clinical Goal(s):  [name] Kitchen Over the next 30 days, client will work with LCSW to address concerns related to client's anger at being placed at [facility]  Interventions:  Previously talked with client about his feelings of anger at being placed at [place]  Previously talked with client about strategies for managing anger outbursts of client  Provided counseling support for client  Talked with client about recreational activities of choice (watching birds, watching favorite TV shows, take a short walk,listens to music on his radio))  Talked with client about the needs of his mother  Talked with client about his doing art drawings  Patient Self Care Activities:  . Attends all scheduled provider appointments . Attends church or other social activities  Plan:  . Patient will call LCSW , as needed, to discuss client feelings of anger and managing feelings of anger  . Patient will use strategies discussed to help him manage anger outbursts . Patient will attend appointments as scheduled with psychiatrist. . Patient will communicate needs to care provider staff at [place] . LCSW to call client in next 4 weeks to discuss client management of anger issues regarding current placement  Initial goal documentation        Materials Provided: No  Follow Up Plan: LCSW to call client in next 4 weeks to discuss client management of anger outbursts regarding current placement  The patient verbalized understanding of instructions provided today and declined a print copy of patient instruction  materials.  [name].Shir Bergman MSW, LCSW Licensed Clinical Social Worker [facility] [phone]

## 2019-05-10 ENCOUNTER — Other Ambulatory Visit: Payer: Self-pay | Admitting: Family Medicine

## 2019-05-13 ENCOUNTER — Ambulatory Visit (INDEPENDENT_AMBULATORY_CARE_PROVIDER_SITE_OTHER): Payer: Medicare Other | Admitting: *Deleted

## 2019-05-13 VITALS — BP 114/84 | HR 84 | Ht 65.0 in | Wt 217.0 lb

## 2019-05-13 DIAGNOSIS — Z0000 Encounter for general adult medical examination without abnormal findings: Secondary | ICD-10-CM | POA: Diagnosis not present

## 2019-05-13 NOTE — Patient Instructions (Signed)
  David Zamora , Thank you for taking time to come for your Medicare Wellness Visit. I appreciate your ongoing commitment to your health goals. Please review the following plan we discussed and let me know if I can assist you in the future.   These are the goals we discussed: Goals    . Client will talk with LCSW about feelings of anger at being at [name] (pt-stated)     Current Barriers:  [name] Kitchen Mental Health Concerns in patient with Chronic Diagnoses of CKD, Type 2 DM, HTN, GERD, and Depression . Social Isolation  . Self Harm History  Clinical Social Work Clinical Goal(s):  [name] Kitchen Over the next 30 days, client will work with LCSW to address concerns related to client's anger at being placed at [place]  Interventions: . Talked with client about his feelings of anger at being placed at [place] . Talked with client about strategies for managing anger outbursts of client . Talked with client about social support network  . Talked with client about recreational activities of choice (watching birds, listening to music, watching favorite TV shows)  Patient Self Care Activities:  . Attends all scheduled provider appointments . Attends church or other social activities  Plan:  . Patient will call LCSW , as needed, to discuss client feelings of anger and managing feelings of anger  . Patient will use strategies discussed to help him manage anger outbursts . Patient will attend appointments as scheduled with psychiatrist. . Patient will communicate needs to care provider staff at [place] . LCSW to call client in next 3 weeks to discuss client management of anger issues regarding current placement  Initial goal documentation    . Prevent falls     Continue Exercise     . Quit Smoking     Pick a quit date and stop smoking after this date.     . Weight (lb) < 200 lb (90.7 kg)     New pt goal - try to decreased amt of soft drinks        This is a list  of the screening recommended for you and due dates:  Health Maintenance  Topic Date Due  . Eye exam for diabetics  09/05/2015  . Hemoglobin A1C  06/01/2019  . Colon Cancer Screening  08/16/2019  . Complete foot exam   11/29/2019  . Tetanus Vaccine  04/12/2021  . Flu Shot  Completed  . Pneumococcal vaccine  Completed  .  Hepatitis C: One time screening is recommended by Center for Disease Control  (CDC) for  adults born from [birth year] through 1967.   Completed  . HIV Screening  Completed
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
## 2019-05-18 ENCOUNTER — Other Ambulatory Visit: Payer: Medicare Other

## 2019-05-18 ENCOUNTER — Other Ambulatory Visit: Payer: Self-pay

## 2019-05-18 ENCOUNTER — Other Ambulatory Visit: Payer: Self-pay | Admitting: Family Medicine

## 2019-05-18 DIAGNOSIS — R300 Dysuria: Secondary | ICD-10-CM

## 2019-05-18 DIAGNOSIS — E781 Pure hyperglyceridemia: Secondary | ICD-10-CM

## 2019-05-18 DIAGNOSIS — I152 Hypertension secondary to endocrine disorders: Secondary | ICD-10-CM

## 2019-05-18 DIAGNOSIS — E1169 Type 2 diabetes mellitus with other specified complication: Secondary | ICD-10-CM

## 2019-05-18 DIAGNOSIS — I10 Essential (primary) hypertension: Secondary | ICD-10-CM | POA: Diagnosis not present

## 2019-05-18 DIAGNOSIS — E1159 Type 2 diabetes mellitus with other circulatory complications: Secondary | ICD-10-CM

## 2019-05-18 LAB — URINALYSIS, COMPLETE
Bilirubin, UA: NEGATIVE
Glucose, UA: NEGATIVE
Leukocytes,UA: NEGATIVE
Nitrite, UA: NEGATIVE
Specific Gravity, UA: 1.025 (ref 1.005–1.030)
Urobilinogen, Ur: 0.2 mg/dL (ref 0.2–1.0)
pH, UA: 6 (ref 5.0–7.5)

## 2019-05-18 LAB — MICROSCOPIC EXAMINATION
Bacteria, UA: NONE SEEN
Epithelial Cells (non renal): NONE SEEN /hpf (ref 0–10)
Renal Epithel, UA: NONE SEEN /hpf

## 2019-05-18 LAB — BAYER DCA HB A1C WAIVED: HB A1C (BAYER DCA - WAIVED): 6.1 % (ref <7.0)

## 2019-05-18 NOTE — Progress Notes (Signed)
Placed lab orders.

## 2019-05-19 LAB — CMP14+EGFR
ALT: 8 IU/L (ref 0–44)
AST: 13 IU/L (ref 0–40)
Albumin/Globulin Ratio: 1.6 (ref 1.2–2.2)
Albumin: 3.8 g/dL (ref 3.8–4.9)
Alkaline Phosphatase: 69 IU/L (ref 39–117)
BUN/Creatinine Ratio: 10 (ref 10–24)
BUN: 19 mg/dL (ref 8–27)
Bilirubin Total: <0.2 mg/dL (ref 0.0–1.2)
CO2: 25 mmol/L (ref 20–29)
Calcium: 9.4 mg/dL (ref 8.6–10.2)
Chloride: 105 mmol/L (ref 96–106)
Creatinine, Ser: 1.85 mg/dL — ABNORMAL HIGH (ref 0.76–1.27)
GFR calc Af Amer: 45 mL/min/{1.73_m2} — ABNORMAL LOW (ref >59)
GFR calc non Af Amer: 39 mL/min/{1.73_m2} — ABNORMAL LOW (ref >59)
Globulin, Total: 2.4 g/dL (ref 1.5–4.5)
Glucose: 119 mg/dL — ABNORMAL HIGH (ref 65–99)
Potassium: 5.3 mmol/L — ABNORMAL HIGH (ref 3.5–5.2)
Sodium: 143 mmol/L (ref 134–144)
Total Protein: 6.2 g/dL (ref 6.0–8.5)

## 2019-05-19 LAB — CBC WITH DIFFERENTIAL/PLATELET
Basophils Absolute: 0 10*3/uL (ref 0.0–0.2)
Basos: 1 %
EOS (ABSOLUTE): 0.3 10*3/uL (ref 0.0–0.4)
Eos: 5 %
Hematocrit: 33.9 % — ABNORMAL LOW (ref 37.5–51.0)
Hemoglobin: 11.3 g/dL — ABNORMAL LOW (ref 13.0–17.7)
Immature Grans (Abs): 0.1 10*3/uL (ref 0.0–0.1)
Immature Granulocytes: 1 %
Lymphocytes Absolute: 2.2 10*3/uL (ref 0.7–3.1)
Lymphs: 35 %
MCH: 31.7 pg (ref 26.6–33.0)
MCHC: 33.3 g/dL (ref 31.5–35.7)
MCV: 95 fL (ref 79–97)
Monocytes Absolute: 0.4 10*3/uL (ref 0.1–0.9)
Monocytes: 6 %
Neutrophils Absolute: 3.2 10*3/uL (ref 1.4–7.0)
Neutrophils: 52 %
Platelets: 308 10*3/uL (ref 150–450)
RBC: 3.56 x10E6/uL — ABNORMAL LOW (ref 4.14–5.80)
RDW: 13.9 % (ref 11.6–15.4)
WBC: 6.2 10*3/uL (ref 3.4–10.8)

## 2019-05-19 LAB — URINE CULTURE

## 2019-05-19 LAB — LIPID PANEL
Chol/HDL Ratio: 4.4 ratio (ref 0.0–5.0)
Cholesterol, Total: 146 mg/dL (ref 100–199)
HDL: 33 mg/dL — ABNORMAL LOW (ref >39)
LDL Chol Calc (NIH): 71 mg/dL (ref 0–99)
Triglycerides: 257 mg/dL — ABNORMAL HIGH (ref 0–149)
VLDL Cholesterol Cal: 42 mg/dL — ABNORMAL HIGH (ref 5–40)

## 2019-05-30 ENCOUNTER — Other Ambulatory Visit: Payer: Self-pay

## 2019-05-30 ENCOUNTER — Other Ambulatory Visit: Payer: Self-pay | Admitting: Family Medicine

## 2019-05-30 ENCOUNTER — Other Ambulatory Visit: Payer: Medicare Other

## 2019-06-02 ENCOUNTER — Ambulatory Visit (INDEPENDENT_AMBULATORY_CARE_PROVIDER_SITE_OTHER): Payer: Medicare Other | Admitting: Licensed Clinical Social Worker

## 2019-06-02 DIAGNOSIS — E781 Pure hyperglyceridemia: Secondary | ICD-10-CM | POA: Diagnosis not present

## 2019-06-02 DIAGNOSIS — F3341 Major depressive disorder, recurrent, in partial remission: Secondary | ICD-10-CM | POA: Diagnosis not present

## 2019-06-02 DIAGNOSIS — K219 Gastro-esophageal reflux disease without esophagitis: Secondary | ICD-10-CM

## 2019-06-02 DIAGNOSIS — I10 Essential (primary) hypertension: Secondary | ICD-10-CM | POA: Diagnosis not present

## 2019-06-02 DIAGNOSIS — I152 Hypertension secondary to endocrine disorders: Secondary | ICD-10-CM

## 2019-06-02 DIAGNOSIS — E1159 Type 2 diabetes mellitus with other circulatory complications: Secondary | ICD-10-CM | POA: Diagnosis not present

## 2019-06-02 DIAGNOSIS — E1169 Type 2 diabetes mellitus with other specified complication: Secondary | ICD-10-CM

## 2019-06-02 NOTE — Chronic Care Management (AMB) (Signed)
Care Management Note   David Zamora is a 59 y.o. year old male who is a primary care patient of Dettinger, Joshua A, MD. The CM team was consulted for assistance with chronic disease management and care coordination.   I reached out to Karl B Prust by phone today.   Review of patient status, including review of consultants reports, relevant laboratory and other test results, and collaboration with appropriate care team members and the patient's provider was performed as part of comprehensive patient evaluation and provision of chronic care management services.   Social determinants of health: risk of tobacco use; risk of depression    Office Visit from 02/02/2017 in [facility]  PHQ-9 Total Score  7     Medications   (very important)  New medications from outside sources are available for reconciliation  blood glucose meter kit and supplies KIT buPROPion (WELLBUTRIN XL) 150 MG 24 hr tablet busPIRone (BUSPAR) 10 MG tablet Carboxymeth-Glycerin-Polysorb (REFRESH OPTIVE ADVANCED) 0.5-1-0.5 % SOLN Cholecalciferol (VITAMIN D3) 50000 units TABS divalproex (DEPAKOTE ER) 250 MG 24 hr tablet Emollient (CERAVE) CREA FORA Lancets MISC furosemide (LASIX) 20 MG tablet glipiZIDE (GLUCOTROL XL) 10 MG 24 hr tablet glucose blood (FORA V30A BLOOD GLUCOSE TEST) test strip insulin aspart (NOVOLOG FLEXPEN) 100 UNIT/ML FlexPen Insulin Degludec-Liraglutide (XULTOPHY) 100-3.6 UNIT-MG/ML SOPN Insulin Pen Needle (TRUEPLUS PEN NEEDLES) 31G X 8 MM MISC lisinopril (ZESTRIL) 10 MG tablet metFORMIN (GLUCOPHAGE) 1000 MG tablet omeprazole (PRILOSEC) 20 MG capsule PARoxetine (PAXIL) 20 MG tablet Phenylephrine-DM-GG (TUSSIN CF PO) QUEtiapine (SEROQUEL XR) 300 MG 24 hr tablet simvastatin (ZOCOR) 40 MG tablet tamsulosin (FLOMAX) 0.4 MG CAPS capsule tolnaftate (TINACTIN) 1 % spray triamcinolone ointment (KENALOG) 0.5 % VASCEPA 1 g capsule  Goals    . Client will talk with LCSW  about feelings of anger at being at [name] (pt-stated)     Current Barriers:  . Mental Health Concerns in patient with Chronic Diagnoses of CKD, Type 2 DM, HTN, GERD, and Depression . Social Isolation  . Self Harm History  Clinical Social Work Clinical Goal(s):  . Over the next 30 days, client will work with LCSW to address concerns related to client's anger at being placed at [name]  Interventions:  Previously talked with client about his feelings of anger at being placed at [name]  Talked with client about strategies for managing anger outbursts of client  Provided counseling support for client  Talked with client about recreational activities of choice (watching birds, watching favorite TV shows, take a short walk,listens to music on his radio))   Talked with client about the needs of his mother  Talked with client about his doing art drawings  Patient Self Care Activities:  . Attends all scheduled provider appointments . Attends church or other social activities  Plan:  . Patient will call LCSW , as needed, to discuss client feelings of anger and managing feelings of anger  . Patient will use strategies discussed to help him manage anger outbursts . Patient will attend appointments as scheduled with psychiatrist. . Patient will communicate needs to care provider staff at [name] . LCSW to call client in next 4 weeks to discuss client management of anger issues regarding current placement  Initial goal documentation          Follow Up Plan: LCSW to call client in next 4 weeks to discuss client management of anger issues regarding current placement   S. MSW, LCSW Licensed   Clinical Social Worker [facility] [phone]

## 2019-06-02 NOTE — Patient Instructions (Addendum)
Licensed Clinical Social Worker Visit Information  Goals we discussed today:  Goals    . Client will talk with LCSW about feelings of anger at being at [name] (pt-stated)     Current Barriers:  [name] Kitchen Mental Health Concerns in patient with Chronic Diagnoses of CKD, Type 2 DM, HTN, GERD, and Depression . Social Isolation  . Self Harm History  Clinical Social Work Clinical Goal(s):  [name] Kitchen Over the next 30 days, client will work with LCSW to address concerns related to client's anger at being placed at [place]  Interventions:   Previously talked with client about his feelings of anger at being placed at [place]  Talked with client about strategies for managing anger outbursts of client  Provided counseling support for client  Talked with client about recreational activities of choice (watching birds, watching favorite TV shows, take a short walk,listens to music on his radio))   Talked with client about the needs of his mother   Talked with client about his doing art drawings  Patient Self Care Activities:  . Attends all scheduled provider appointments . Attends church or other social activities  Plan:  . Patient will call LCSW , as needed, to discuss client feelings of anger and managing feelings of anger  . Patient will use strategies discussed to help him manage anger outbursts . Patient will attend appointments as scheduled with psychiatrist. . Patient will communicate needs to care provider staff at [place] . LCSW to call client in next 4 weeks to discuss client management of anger issues regarding current placement  Initial goal documentation           Materials Provided:  No  Follow Up Plan:  LCSW to call client in next 4 weeks to discuss client management of anger issues regarding current placement  The patient verbalized understanding of instructions provided today and declined a print copy of patient instruction materials.    [name].David Zamora MSW, LCSW Licensed Clinical Social Worker [facility] [phone]

## 2019-06-10 ENCOUNTER — Other Ambulatory Visit: Payer: Self-pay | Admitting: Family Medicine

## 2019-06-10 DIAGNOSIS — E781 Pure hyperglyceridemia: Secondary | ICD-10-CM

## 2019-06-10 DIAGNOSIS — E1169 Type 2 diabetes mellitus with other specified complication: Secondary | ICD-10-CM

## 2019-06-20 DIAGNOSIS — Z23 Encounter for immunization: Secondary | ICD-10-CM | POA: Diagnosis not present

## 2019-06-24 DIAGNOSIS — L851 Acquired keratosis [keratoderma] palmaris et plantaris: Secondary | ICD-10-CM | POA: Diagnosis not present

## 2019-06-24 DIAGNOSIS — B351 Tinea unguium: Secondary | ICD-10-CM | POA: Diagnosis not present

## 2019-06-24 DIAGNOSIS — E1142 Type 2 diabetes mellitus with diabetic polyneuropathy: Secondary | ICD-10-CM | POA: Diagnosis not present

## 2019-06-27 ENCOUNTER — Other Ambulatory Visit: Payer: Self-pay

## 2019-06-27 ENCOUNTER — Other Ambulatory Visit: Payer: Medicare Other

## 2019-06-27 DIAGNOSIS — E1122 Type 2 diabetes mellitus with diabetic chronic kidney disease: Secondary | ICD-10-CM | POA: Diagnosis not present

## 2019-06-27 DIAGNOSIS — E875 Hyperkalemia: Secondary | ICD-10-CM | POA: Diagnosis not present

## 2019-06-27 DIAGNOSIS — D631 Anemia in chronic kidney disease: Secondary | ICD-10-CM | POA: Diagnosis not present

## 2019-06-27 DIAGNOSIS — E559 Vitamin D deficiency, unspecified: Secondary | ICD-10-CM | POA: Diagnosis not present

## 2019-06-27 DIAGNOSIS — E1129 Type 2 diabetes mellitus with other diabetic kidney complication: Secondary | ICD-10-CM | POA: Diagnosis not present

## 2019-06-27 DIAGNOSIS — R809 Proteinuria, unspecified: Secondary | ICD-10-CM | POA: Diagnosis not present

## 2019-06-27 DIAGNOSIS — N189 Chronic kidney disease, unspecified: Secondary | ICD-10-CM | POA: Diagnosis not present

## 2019-06-29 ENCOUNTER — Ambulatory Visit (INDEPENDENT_AMBULATORY_CARE_PROVIDER_SITE_OTHER): Payer: Medicare Other | Admitting: Licensed Clinical Social Worker

## 2019-06-29 DIAGNOSIS — E1159 Type 2 diabetes mellitus with other circulatory complications: Secondary | ICD-10-CM

## 2019-06-29 DIAGNOSIS — E875 Hyperkalemia: Secondary | ICD-10-CM | POA: Diagnosis not present

## 2019-06-29 DIAGNOSIS — R809 Proteinuria, unspecified: Secondary | ICD-10-CM | POA: Diagnosis not present

## 2019-06-29 DIAGNOSIS — N189 Chronic kidney disease, unspecified: Secondary | ICD-10-CM | POA: Diagnosis not present

## 2019-06-29 DIAGNOSIS — E1169 Type 2 diabetes mellitus with other specified complication: Secondary | ICD-10-CM

## 2019-06-29 DIAGNOSIS — D631 Anemia in chronic kidney disease: Secondary | ICD-10-CM | POA: Diagnosis not present

## 2019-06-29 DIAGNOSIS — Z79899 Other long term (current) drug therapy: Secondary | ICD-10-CM | POA: Diagnosis not present

## 2019-06-29 DIAGNOSIS — I10 Essential (primary) hypertension: Secondary | ICD-10-CM

## 2019-06-29 DIAGNOSIS — E1122 Type 2 diabetes mellitus with diabetic chronic kidney disease: Secondary | ICD-10-CM | POA: Diagnosis not present

## 2019-06-29 DIAGNOSIS — F3341 Major depressive disorder, recurrent, in partial remission: Secondary | ICD-10-CM | POA: Diagnosis not present

## 2019-06-29 DIAGNOSIS — E1129 Type 2 diabetes mellitus with other diabetic kidney complication: Secondary | ICD-10-CM | POA: Diagnosis not present

## 2019-06-29 DIAGNOSIS — E559 Vitamin D deficiency, unspecified: Secondary | ICD-10-CM | POA: Diagnosis not present

## 2019-06-29 DIAGNOSIS — K219 Gastro-esophageal reflux disease without esophagitis: Secondary | ICD-10-CM

## 2019-06-29 DIAGNOSIS — E781 Pure hyperglyceridemia: Secondary | ICD-10-CM | POA: Diagnosis not present

## 2019-06-29 DIAGNOSIS — I152 Hypertension secondary to endocrine disorders: Secondary | ICD-10-CM

## 2019-06-29 NOTE — Chronic Care Management (AMB) (Signed)
Care Management Note   David Zamora is a 59 y.o. year old male who is a primary care patient of Dettinger, Fransisca Kaufmann, MD. The CM team was consulted for assistance with chronic disease management and care coordination.   I reached out to David Zamora by phone today.    Review of patient status, including review of consultants reports, relevant laboratory and other test results, and collaboration with appropriate care team members and the patient's provider was performed as part of comprehensive patient evaluation and provision of chronic care management services.  Social determinants of health: risk of tobacco use; risk of depression    Office Visit from 02/02/2017 in [place]  PHQ-9 Total Score  7      Medications   (very important)  New medications from outside sources are available for reconciliation  blood glucose meter kit and supplies KIT buPROPion (WELLBUTRIN XL) 150 MG 24 hr tablet busPIRone (BUSPAR) 10 MG tablet Carboxymeth-Glycerin-Polysorb (REFRESH OPTIVE ADVANCED) 0.5-1-0.5 % SOLN Cholecalciferol (VITAMIN D3) 50000 units TABS divalproex (DEPAKOTE ER) 250 MG 24 hr tablet Emollient (CERAVE) CREA FORA Lancets MISC furosemide (LASIX) 20 MG tablet glipiZIDE (GLUCOTROL XL) 10 MG 24 hr tablet glucose blood (FORA V30A BLOOD GLUCOSE TEST) test strip insulin aspart (NOVOLOG FLEXPEN) 100 UNIT/ML FlexPen Insulin Pen Needle (TRUEPLUS PEN NEEDLES) 31G X 8 MM MISC lisinopril (ZESTRIL) 10 MG tablet metFORMIN (GLUCOPHAGE) 1000 MG tablet omeprazole (PRILOSEC) 20 MG capsule PARoxetine (PAXIL) 20 MG tablet Phenylephrine-DM-GG (TUSSIN CF PO) QUEtiapine (SEROQUEL XR) 300 MG 24 hr tablet simvastatin (ZOCOR) 40 MG tablet tamsulosin (FLOMAX) 0.4 MG CAPS capsule tolnaftate (TINACTIN) 1 % spray triamcinolone ointment (KENALOG) 0.5 % VASCEPA 1 g capsule XULTOPHY 100-3.6 UNIT-MG/ML SOPN  Goals    . Client will talk with LCSW about feelings of anger at being  at [facility] (pt-stated)     Current Barriers:  David Zamora Kitchen Mental Health Concerns in patient with Chronic Diagnoses of CKD, Type 2 DM, HTN, GERD, and Depression . Social Isolation  . Self Harm History  Clinical Social Work Clinical Goal(s):  David Zamora Kitchen Over the next 30 days, client will work with LCSW to address concerns related to client's anger at being placed at [facility]  Interventions:  Previously talked with client about his feelings of anger at being placed at [facility]  Talked with client about strategies for managing anger outbursts of client  Provided counseling support for client  Talked with client about recreational activities of choice (watching birds, watching favorite TV shows, take a short walk,listens to music on his radio))   Talked with client about the needs of his mother  Talked with client about his doing art drawings  Talked with client about his participation in group activities at [facility]   Patient Self Care Activities:  . Attends all scheduled provider appointments . Attends church or other social activities  Plan:  . Patient will call LCSW , as needed, to discuss client feelings of anger and managing feelings of anger  . Patient will use strategies discussed to help him manage anger outbursts . Patient will attend appointments as scheduled with psychiatrist. . Patient will communicate needs to care provider staff at [name] . LCSW to call client in next 4 weeks to discuss client management of anger issues regarding current placement  Initial goal documentation    Follow Up Plan: LCSW to call client in next 4 weeks to discuss client management of anger issues regarding  current placement  David Zamora MSW, LCSW Licensed Clinical Social Worker [facility] Care [facility] [phone]

## 2019-06-29 NOTE — Patient Instructions (Addendum)
Licensed Clinical Social Worker Visit Information  Goals we discussed today:  Goals    . Client will talk with LCSW about feelings of anger at being at [name] (pt-stated)     Current Barriers:  [name] Kitchen Mental Health Concerns in patient with Chronic Diagnoses of CKD, Type 2 DM, HTN, GERD, and Depression . Social Isolation  . Self Harm History  Clinical Social Work Clinical Goal(s):  [name] Kitchen Over the next 30 days, client will work with LCSW to address concerns related to client's anger at being placed at [place]  Interventions:  Previously talked with client about his feelings of anger at being placed at [place]  Talked with client about strategies for managing anger outbursts of client  Provided counseling support for client  Talked with client about recreational activities of choice (watching birds, watching favorite TV shows, take a short walk,listens to music on his radio))   Talked with client about the needs of his mother  Talked with client about his doing art drawings  Talked with client about his participation in group activities at [place]  Patient Self Care Activities:  . Attends all scheduled provider appointments . Attends church or other social activities  Plan:  . Patient will call LCSW , as needed, to discuss client feelings of anger and managing feelings of anger  . Patient will use strategies discussed to help him manage anger outbursts . Patient will attend appointments as scheduled with psychiatrist. . Patient will communicate needs to care provider staff at [place] . LCSW to call client in next 4 weeks to discuss client management of anger issues regarding current placement  Initial goal documentation      Materials Provided:  No  Follow Up Plan: LCSW to call client in next 4 weeks to discuss client management of anger issues regarding current placement    The patient verbalized understanding of  instructions provided today and declined a print copy of patient instruction materials.   [name].Wealthy Danielski MSW, LCSW Licensed Clinical Social Worker [facility] Care [facility] [phone]

## 2019-06-30 ENCOUNTER — Other Ambulatory Visit: Payer: Self-pay | Admitting: Family Medicine

## 2019-06-30 DIAGNOSIS — E1169 Type 2 diabetes mellitus with other specified complication: Secondary | ICD-10-CM

## 2019-06-30 DIAGNOSIS — E781 Pure hyperglyceridemia: Secondary | ICD-10-CM

## 2019-07-14 ENCOUNTER — Other Ambulatory Visit: Payer: Self-pay | Admitting: Family Medicine

## 2019-07-19 DIAGNOSIS — F71 Moderate intellectual disabilities: Secondary | ICD-10-CM | POA: Diagnosis not present

## 2019-07-19 DIAGNOSIS — Z23 Encounter for immunization: Secondary | ICD-10-CM | POA: Diagnosis not present

## 2019-07-23 DIAGNOSIS — F71 Moderate intellectual disabilities: Secondary | ICD-10-CM | POA: Diagnosis not present

## 2019-07-24 DIAGNOSIS — F71 Moderate intellectual disabilities: Secondary | ICD-10-CM | POA: Diagnosis not present

## 2019-07-26 ENCOUNTER — Ambulatory Visit: Payer: Medicare Other | Admitting: Licensed Clinical Social Worker

## 2019-07-26 DIAGNOSIS — F3341 Major depressive disorder, recurrent, in partial remission: Secondary | ICD-10-CM

## 2019-07-26 DIAGNOSIS — E1169 Type 2 diabetes mellitus with other specified complication: Secondary | ICD-10-CM

## 2019-07-26 DIAGNOSIS — K219 Gastro-esophageal reflux disease without esophagitis: Secondary | ICD-10-CM

## 2019-07-26 DIAGNOSIS — I152 Hypertension secondary to endocrine disorders: Secondary | ICD-10-CM

## 2019-07-26 DIAGNOSIS — E1159 Type 2 diabetes mellitus with other circulatory complications: Secondary | ICD-10-CM

## 2019-07-26 DIAGNOSIS — E781 Pure hyperglyceridemia: Secondary | ICD-10-CM

## 2019-07-26 NOTE — Patient Instructions (Addendum)
Licensed Clinical Social Worker Visit Information  Goals we discussed today:  Goals    . Client will talk with LCSW about feelings of anger at being at [name] (pt-stated)     Current Barriers:  [name] Kitchen Mental Health Concerns in patient with Chronic Diagnoses of CKD, Type 2 DM, HTN, GERD, and Depression . Social Isolation  . Self Harm History  Clinical Social Work Clinical Goal(s):  [name] Kitchen Over the next 30 days, client will work with LCSW to address concerns related to client's anger at being placed at [place]  Interventions:  Previously talked with client about his feelings of anger at being placed at [place]  Talked with client about strategies for managing anger outbursts of client  Talked with client about recreational activities of choice (watching birds, watching favorite TV shows, take a short walk,listens to music on his radio))   Talked with client about the needs of his mother  Talked with client about his doing art drawings  Talked with client about his participation in group activities at [place]  Patient Self Care Activities:  . Attends all scheduled provider appointments . Attends church or other social activities  Plan:  . Patient will call LCSW , as needed, to discuss client feelings of anger and managing feelings of anger  . Patient will use strategies discussed to help him manage anger outbursts . Patient will attend appointments as scheduled with psychiatrist. . Patient will communicate needs to care provider staff at [place] . LCSW to call client in next 4 weeks to discuss client management of anger issues regarding current placement  Initial goal documentation       Materials Provided: No  Follow Up Plan: LCSW to call client in next 4 weeks to discuss client management of anger issues regarding current placement  The patient verbalized understanding of instructions provided today and declined a print copy  of patient instruction materials.   [name].Linnie Delgrande MSW, LCSW Licensed Clinical Social Worker [facility] [phone]

## 2019-07-26 NOTE — Chronic Care Management (AMB) (Signed)
Chronic Care Management    Clinical Social Work Follow Up Note  07/26/2019 Name: David Zamora MRN: [id number] DOB: 04-15-1960  David Zamora is a 59 y.o. year old male who is a primary care patient of Dettinger, Fransisca Kaufmann, MD. The CCM team was consulted for assistance with Mental Health Counseling and Resources.   Review of patient status, including review of consultants reports, other relevant assessments, and collaboration with appropriate care team members and the patient's provider was performed as part of comprehensive patient evaluation and provision of chronic care management services.    SDOH (Social Determinants of Health) assessments performed: Yes ; risk for tobacco use; risk for depression    Office Visit from 02/02/2017 in [facility]  PHQ-9 Total Score  7      Outpatient Encounter Medications as of 07/26/2019  Medication Sig  . blood glucose meter kit and supplies KIT Dispense based on patient and insurance preference. Use up to four times daily as directed. (FOR ICD-9 250.00, 250.01).  [name] Kitchen buPROPion (WELLBUTRIN XL) 150 MG 24 hr tablet Take 150 mg by mouth every morning.   . busPIRone (BUSPAR) 10 MG tablet Take 10 mg by mouth 2 (two) times daily.  . Carboxymeth-Glycerin-Polysorb (REFRESH OPTIVE ADVANCED) 0.5-1-0.5 % SOLN Apply 1 drop to eye 4 (four) times daily.  . Cholecalciferol (VITAMIN D3) 50000 units TABS Take 50,000 Units by mouth every Saturday.   . divalproex (DEPAKOTE ER) 250 MG 24 hr tablet Take 250-1,000 mg by mouth See admin instructions. 256m in the morning and 10052m at bedtime  . Emollient (CERAVE) CREA APPLY 1 APPLICATION ONCE A DAY TO BOTH FEET  . FORA Lancets MISC USE TO TEST BLOOD SUGAR 3 TIMES A DAY USE TO TEST BLOOD SUGAR AS NEEDED FOR SIGNS/SYMPTOMS OF HYPOGLYCEMIA (SHAKINESS, SWEATING, BLURRED VIS  . furosemide (LASIX) 20 MG tablet Take 20 mg by mouth daily as needed (for weight gain > 3 lbs).  . [name] KitchenlipiZIDE (GLUCOTROL XL) 10 MG 24  hr tablet TAKE (1) TABLET BY MOUTH ONCE DAILY.  . [name] Kitchenlucose blood (FORA V30A BLOOD GLUCOSE TEST) test strip USE TO TEST BLOOD SUGAR 3 TIMES A DAY AS NEEDED FOR SIGNS/ SYMPTOMS OF HYPOGLYCEMIA SHAKINESS, SWEATING, BLURRED VISION. Dx E11.9  . insulin aspart (NOVOLOG FLEXPEN) 100 UNIT/ML FlexPen Inject 10 Units into the skin 3 (three) times daily with meals. Needs to be seen for future refills.  . Insulin Pen Needle (TRUEPLUS PEN NEEDLES) 31G X 8 MM MISC Use daily with Xultophy  . lisinopril (ZESTRIL) 10 MG tablet TAKE (1) TABLET BY MOUTH ONCE DAILY FOR PROTEINURIA  . metFORMIN (GLUCOPHAGE) 1000 MG tablet TAKE 1 TABLET BY MOUTH TWICE DAILY WITH MEALS.  . [name] Kitchenmeprazole (PRILOSEC) 20 MG capsule Take 1 capsule (20 mg total) by mouth daily. (Needs to be seen before next refill)  . PARoxetine (PAXIL) 20 MG tablet Take 20 mg by mouth every morning.  . [name] Kitchenhenylephrine-DM-GG (TUSSIN CF PO) Take 5 mLs by mouth 4 (four) times daily as needed.  . [name] KitchenUEtiapine (SEROQUEL XR) 300 MG 24 hr tablet Take 300 mg by mouth daily. Taken daily at 4pm  . simvastatin (ZOCOR) 40 MG tablet TAKE (1) TABLET BY MOUTH ONCE DAILY.  . tamsulosin (FLOMAX) 0.4 MG CAPS capsule TAKE 1 CAPSULE BY MOUTH ONCE A DAY.  . [name] Kitchenolnaftate (TINACTIN) 1 % spray Apply topically 2 (two) times daily.  . [name] Kitchenriamcinolone ointment (KENALOG) 0.5 % APPLY TOPICALLY TWICE DAILY. (Patient taking differently: Apply 1 application topically 2 (two)  times daily. )  . VASCEPA 1 g capsule TAKE (2) CAPSULES BY MOUTH TWICE DAILY.  . XULTOPHY 100-3.6 UNIT-MG/ML SOPN INJECT 45 UNITS INTO SKIN ONCE DAILY.   No facility-administered encounter medications on file as of 07/26/2019.    Goals    . Client will talk with LCSW about feelings of anger at being at [name] (pt-stated)     Current Barriers:  [name] Kitchen Mental Health Concerns in patient with Chronic Diagnoses of CKD, Type 2 DM, HTN, GERD, and Depression . Social Isolation  . Self Harm History  Clinical Social Work  Clinical Goal(s):  [name] Kitchen Over the next 30 days, client will work with LCSW to address concerns related to client's anger at being placed at [place]  Interventions:   Previously talked with client about his feelings of anger at being placed at [place]  Talked with client about strategies for managing anger outbursts of client  Talked with client about recreational activities of choice (watching birds, watching favorite TV shows, take a short walk,listens to music on his radio))   Talked with client about the needs of his mother  Talked with client about his doing art drawings  Talked with client about his participation in group activities at [place]  Patient Self Care Activities:  . Attends all scheduled provider appointments . Attends church or other social activities  Plan:  . Patient will call LCSW , as needed, to discuss client feelings of anger and managing feelings of anger  . Patient will use strategies discussed to help him manage anger outbursts . Patient will attend appointments as scheduled with psychiatrist. . Patient will communicate needs to care provider staff at [place] . LCSW to call client in next 4 weeks to discuss client management of anger issues regarding current placement  Initial goal documentation         Follow Up Plan: LCSW to call client in next 4 weeks to talk with client about client management of anger issues regarding current placement  [name].Kemberly Taves MSW, LCSW Licensed Clinical Social Worker [facility] [phone]

## 2019-08-09 ENCOUNTER — Other Ambulatory Visit: Payer: Self-pay | Admitting: Family Medicine

## 2019-08-09 DIAGNOSIS — E1169 Type 2 diabetes mellitus with other specified complication: Secondary | ICD-10-CM

## 2019-08-09 NOTE — Telephone Encounter (Signed)
Dettinger. NTBS 30 days given 06/30/19

## 2019-08-10 ENCOUNTER — Ambulatory Visit (INDEPENDENT_AMBULATORY_CARE_PROVIDER_SITE_OTHER): Payer: Medicare Other | Admitting: Licensed Clinical Social Worker

## 2019-08-10 DIAGNOSIS — F3341 Major depressive disorder, recurrent, in partial remission: Secondary | ICD-10-CM

## 2019-08-10 DIAGNOSIS — E1169 Type 2 diabetes mellitus with other specified complication: Secondary | ICD-10-CM

## 2019-08-10 DIAGNOSIS — K219 Gastro-esophageal reflux disease without esophagitis: Secondary | ICD-10-CM

## 2019-08-10 DIAGNOSIS — E1159 Type 2 diabetes mellitus with other circulatory complications: Secondary | ICD-10-CM | POA: Diagnosis not present

## 2019-08-10 DIAGNOSIS — E781 Pure hyperglyceridemia: Secondary | ICD-10-CM

## 2019-08-10 DIAGNOSIS — I10 Essential (primary) hypertension: Secondary | ICD-10-CM | POA: Diagnosis not present

## 2019-08-10 DIAGNOSIS — I152 Hypertension secondary to endocrine disorders: Secondary | ICD-10-CM

## 2019-08-10 NOTE — Chronic Care Management (AMB) (Signed)
Chronic Care Management    Clinical Social Work Follow Up Note  08/10/2019 Name: David Zamora MRN: [id number] DOB: Feb 07, 1961  David Zamora is a 59 y.o. year old male who is a primary care patient of Dettinger, Fransisca Kaufmann, MD. The CCM team was consulted for assistance with [employer] .   Review of patient status, including review of consultants reports, other relevant assessments, and collaboration with appropriate care team members and the patient's provider was performed as part of comprehensive patient evaluation and provision of chronic care management services.    SDOH (Social Determinants of Health) assessments performed: Yes;risk of tobacco use; risk of depression    Office Visit from 02/02/2017 in [place]  PHQ-9 Total Score  7      Outpatient Encounter Medications as of 08/10/2019  Medication Sig  . blood glucose meter kit and supplies KIT Dispense based on patient and insurance preference. Use up to four times daily as directed. (FOR ICD-9 250.00, 250.01).  [name] Kitchen buPROPion (WELLBUTRIN XL) 150 MG 24 hr tablet Take 150 mg by mouth every morning.   . busPIRone (BUSPAR) 10 MG tablet Take 10 mg by mouth 2 (two) times daily.  . Carboxymeth-Glycerin-Polysorb (REFRESH OPTIVE ADVANCED) 0.5-1-0.5 % SOLN Apply 1 drop to eye 4 (four) times daily.  . Cholecalciferol (VITAMIN D3) 50000 units TABS Take 50,000 Units by mouth every Saturday.   . divalproex (DEPAKOTE ER) 250 MG 24 hr tablet Take 250-1,000 mg by mouth See admin instructions. 250m in the morning and 100109m at bedtime  . Emollient (CERAVE) CREA APPLY 1 APPLICATION ONCE A DAY TO BOTH FEET  . FORA Lancets MISC USE TO TEST BLOOD SUGAR 3 TIMES A DAY USE TO TEST BLOOD SUGAR AS NEEDED FOR SIGNS/SYMPTOMS OF HYPOGLYCEMIA (SHAKINESS, SWEATING, BLURRED VIS  . furosemide (LASIX) 20 MG tablet Take 20 mg by mouth daily as needed (for weight gain > 3 lbs).  . [name] KitchenlipiZIDE (GLUCOTROL XL) 10 MG 24 hr tablet TAKE (1)  TABLET BY MOUTH ONCE DAILY.  . [name] Kitchenlucose blood (FORA V30A BLOOD GLUCOSE TEST) test strip USE TO TEST BLOOD SUGAR 3 TIMES A DAY AS NEEDED FOR SIGNS/ SYMPTOMS OF HYPOGLYCEMIA SHAKINESS, SWEATING, BLURRED VISION. Dx E11.9  . insulin aspart (NOVOLOG FLEXPEN) 100 UNIT/ML FlexPen Inject 10 Units into the skin 3 (three) times daily with meals. Needs to be seen for future refills.  . Insulin Pen Needle (TRUEPLUS PEN NEEDLES) 31G X 8 MM MISC Use daily with Xultophy  . lisinopril (ZESTRIL) 10 MG tablet TAKE (1) TABLET BY MOUTH ONCE DAILY FOR PROTEINURIA  . metFORMIN (GLUCOPHAGE) 1000 MG tablet TAKE 1 TABLET BY MOUTH TWICE DAILY WITH MEALS.  . [name] Kitchenmeprazole (PRILOSEC) 20 MG capsule Take 1 capsule (20 mg total) by mouth daily. (Needs to be seen before next refill)  . PARoxetine (PAXIL) 20 MG tablet Take 20 mg by mouth every morning.  . [name] Kitchenhenylephrine-DM-GG (TUSSIN CF PO) Take 5 mLs by mouth 4 (four) times daily as needed.  . [name] KitchenUEtiapine (SEROQUEL XR) 300 MG 24 hr tablet Take 300 mg by mouth daily. Taken daily at 4pm  . simvastatin (ZOCOR) 40 MG tablet TAKE (1) TABLET BY MOUTH ONCE DAILY.  . tamsulosin (FLOMAX) 0.4 MG CAPS capsule TAKE 1 CAPSULE BY MOUTH ONCE A DAY.  . [name] Kitchenolnaftate (TINACTIN) 1 % spray Apply topically 2 (two) times daily.  . [name] Kitchenriamcinolone ointment (KENALOG) 0.5 % APPLY TOPICALLY TWICE DAILY. (Patient taking differently: Apply 1 application topically 2 (two) times daily. )  .  VASCEPA 1 g capsule TAKE (2) CAPSULES BY MOUTH TWICE DAILY.  . XULTOPHY 100-3.6 UNIT-MG/ML SOPN INJECT 45 UNITS INTO SKIN ONCE DAILY.   No facility-administered encounter medications on file as of 08/10/2019.    Goals    . Client will talk with LCSW about feelings of anger at being at [name] (pt-stated)     Current Barriers:  [name] Kitchen Mental Health Concerns in patient with Chronic Diagnoses of CKD, Type 2 DM, HTN, GERD, and Depression . Social Isolation  . Self Harm History  Clinical Social Work Clinical Goal(s):   [name] Kitchen Over the next 30 days, client will work with LCSW to address concerns related to client's anger at being placed at [place]  Interventions:  Previously talked with client about his feelings of anger at being placed at [place]  Talked with client about strategies for managing anger outbursts of client  Talked with client about recreational activities of choice (watching birds, watching favorite TV shows, take a short walk,listens to music on his radio))   Talked with client about the needs of his mother  Talked with client about his doing art drawings  Talked with client about his participation in group activities at [place]  Patient Self Care Activities:  . Attends all scheduled provider appointments . Attends church or other social activities  Plan:  . Patient will call LCSW , as needed, to discuss client feelings of anger and managing feelings of anger  . Patient will use strategies discussed to help him manage anger outbursts . Patient will attend appointments as scheduled with psychiatrist. . Patient will communicate needs to care provider staff at [place] . LCSW to call client in next 4 weeks to discuss client management of anger issues regarding current placement  Initial goal documentation      Follow Up Plan: LCSW to call client in next 4 weeks to discuss client management of anger issues regarding current placement  [name].[name] MSW, LCSW Licensed Clinical Social Worker [facility] [phone]

## 2019-08-10 NOTE — Patient Instructions (Addendum)
Licensed Clinical Social Worker Visit Information  Goals we discussed today:  Goals    . Client will talk with LCSW about feelings of anger at being at [name] (pt-stated)     Current Barriers:  [name] Kitchen Mental Health Concerns in patient with Chronic Diagnoses of CKD, Type 2 DM, HTN, GERD, and Depression . Social Isolation  . Self Harm History  Clinical Social Work Clinical Goal(s):  [name] Kitchen Over the next 30 days, client will work with LCSW to address concerns related to client's anger at being placed at [place]  Interventions:  Previously talked with client about his feelings of anger at being placed at [place]  Talked with client about strategies for managing anger outbursts of client  Talked with client about recreational activities of choice (watching birds, watching favorite TV shows, take a short walk,listens to music on his radio))   Talked with client about the needs of his mother  Talked with client about his doing art drawings  Talked with client about his participation in group activities at [place]  Patient Self Care Activities:  . Attends all scheduled provider appointments . Attends church or other social activities  Plan:  . Patient will call LCSW , as needed, to discuss client feelings of anger and managing feelings of anger  . Patient will use strategies discussed to help him manage anger outbursts . Patient will attend appointments as scheduled with psychiatrist. . Patient will communicate needs to care provider staff at [facility] . LCSW to call client in next 4 weeks to discuss client management of anger issues regarding current placement  Initial goal documentation       Materials Provided: No  Follow Up Plan: LCSW to call client in next 4 weeks to discuss client management of anger issues regarding current placement  The patient verbalized understanding of instructions provided today and declined a print copy  of patient instruction materials.   [name].Keir Foland MSW, LCSW Licensed Clinical Social Worker [facility] [phone]

## 2019-08-25 ENCOUNTER — Other Ambulatory Visit: Payer: Self-pay | Admitting: Family Medicine

## 2019-08-29 ENCOUNTER — Other Ambulatory Visit: Payer: Self-pay | Admitting: Family Medicine

## 2019-08-30 ENCOUNTER — Other Ambulatory Visit: Payer: Self-pay | Admitting: Family Medicine

## 2019-08-31 ENCOUNTER — Other Ambulatory Visit: Payer: Self-pay | Admitting: Family Medicine

## 2019-09-05 ENCOUNTER — Other Ambulatory Visit: Payer: Medicare Other

## 2019-09-05 ENCOUNTER — Other Ambulatory Visit: Payer: Self-pay

## 2019-09-05 DIAGNOSIS — N1831 Chronic kidney disease, stage 3a: Secondary | ICD-10-CM | POA: Diagnosis not present

## 2019-09-05 DIAGNOSIS — D631 Anemia in chronic kidney disease: Secondary | ICD-10-CM | POA: Diagnosis not present

## 2019-09-05 DIAGNOSIS — R809 Proteinuria, unspecified: Secondary | ICD-10-CM | POA: Diagnosis not present

## 2019-09-05 DIAGNOSIS — Z79899 Other long term (current) drug therapy: Secondary | ICD-10-CM | POA: Diagnosis not present

## 2019-09-07 DIAGNOSIS — E875 Hyperkalemia: Secondary | ICD-10-CM | POA: Diagnosis not present

## 2019-09-07 DIAGNOSIS — E872 Acidosis: Secondary | ICD-10-CM | POA: Diagnosis not present

## 2019-09-07 DIAGNOSIS — R809 Proteinuria, unspecified: Secondary | ICD-10-CM | POA: Diagnosis not present

## 2019-09-07 DIAGNOSIS — E1129 Type 2 diabetes mellitus with other diabetic kidney complication: Secondary | ICD-10-CM | POA: Diagnosis not present

## 2019-09-07 DIAGNOSIS — E211 Secondary hyperparathyroidism, not elsewhere classified: Secondary | ICD-10-CM | POA: Diagnosis not present

## 2019-09-07 DIAGNOSIS — N189 Chronic kidney disease, unspecified: Secondary | ICD-10-CM | POA: Diagnosis not present

## 2019-09-07 DIAGNOSIS — D631 Anemia in chronic kidney disease: Secondary | ICD-10-CM | POA: Diagnosis not present

## 2019-09-07 DIAGNOSIS — E1122 Type 2 diabetes mellitus with diabetic chronic kidney disease: Secondary | ICD-10-CM | POA: Diagnosis not present

## 2019-09-09 ENCOUNTER — Other Ambulatory Visit: Payer: Self-pay

## 2019-09-09 MED ORDER — OMEPRAZOLE 20 MG PO CPDR: 20.0000 mg | DELAYED_RELEASE_CAPSULE | Freq: Every day | ORAL | 0 refills | Status: DC

## 2019-09-09 NOTE — Telephone Encounter (Signed)
Patient has upcoming apt - 30 day supply given to patient.

## 2019-09-13 ENCOUNTER — Ambulatory Visit (INDEPENDENT_AMBULATORY_CARE_PROVIDER_SITE_OTHER): Payer: Medicare Other | Admitting: Licensed Clinical Social Worker

## 2019-09-13 DIAGNOSIS — K219 Gastro-esophageal reflux disease without esophagitis: Secondary | ICD-10-CM

## 2019-09-13 DIAGNOSIS — I10 Essential (primary) hypertension: Secondary | ICD-10-CM

## 2019-09-13 DIAGNOSIS — F3341 Major depressive disorder, recurrent, in partial remission: Secondary | ICD-10-CM

## 2019-09-13 DIAGNOSIS — E781 Pure hyperglyceridemia: Secondary | ICD-10-CM | POA: Diagnosis not present

## 2019-09-13 DIAGNOSIS — I152 Hypertension secondary to endocrine disorders: Secondary | ICD-10-CM

## 2019-09-13 DIAGNOSIS — E1169 Type 2 diabetes mellitus with other specified complication: Secondary | ICD-10-CM | POA: Diagnosis not present

## 2019-09-13 DIAGNOSIS — F79 Unspecified intellectual disabilities: Secondary | ICD-10-CM

## 2019-09-13 DIAGNOSIS — E1159 Type 2 diabetes mellitus with other circulatory complications: Secondary | ICD-10-CM

## 2019-09-13 NOTE — Chronic Care Management (AMB) (Signed)
Chronic Care Management    Clinical Social Work Follow Up Note  09/13/2019 Name: David Zamora MRN: [id number] DOB: 1961/02/27  David Zamora is a 59 y.o. year old male who is a primary care patient of Dettinger, Fransisca Kaufmann, MD. The CCM team was consulted for assistance with [facility] .   Review of patient status, including review of consultants reports, other relevant assessments, and collaboration with appropriate care team members and the patient's provider was performed as part of comprehensive patient evaluation and provision of chronic care management services.    SDOH (Social Determinants of Health) assessments performed: Yes;risk for tobacco use; risk for depression    Office Visit from 02/02/2017 in [place]  PHQ-9 Total Score  7      Outpatient Encounter Medications as of 09/13/2019  Medication Sig  . blood glucose meter kit and supplies KIT Dispense based on patient and insurance preference. Use up to four times daily as directed. (FOR ICD-9 250.00, 250.01).  [name] Kitchen buPROPion (WELLBUTRIN XL) 150 MG 24 hr tablet Take 150 mg by mouth every morning.   . busPIRone (BUSPAR) 10 MG tablet Take 10 mg by mouth 2 (two) times daily.  . Carboxymeth-Glycerin-Polysorb (REFRESH OPTIVE ADVANCED) 0.5-1-0.5 % SOLN Apply 1 drop to eye 4 (four) times daily.  . Cholecalciferol (VITAMIN D3) 50000 units TABS Take 50,000 Units by mouth every Saturday.   . divalproex (DEPAKOTE ER) 250 MG 24 hr tablet Take 250-1,000 mg by mouth See admin instructions. 241m in the morning and 10058m at bedtime  . Emollient (CERAVE) CREA APPLY 1 APPLICATION ONCE A DAY TO BOTH FEET  . FORA Lancets MISC USE TO TEST BLOOD SUGAR 3 TIMES A DAY USE TO TEST BLOOD SUGAR AS NEEDED FOR SIGNS/SYMPTOMS OF HYPOGLYCEMIA (SHAKINESS, SWEATING, BLURRED VIS  . furosemide (LASIX) 20 MG tablet Take 20 mg by mouth daily as needed (for weight gain > 3 lbs).  . [name] KitchenlipiZIDE (GLUCOTROL XL) 10 MG 24 hr tablet TAKE (1)  TABLET BY MOUTH ONCE DAILY.  . [name] Kitchenlucose blood (FORA V30A BLOOD GLUCOSE TEST) test strip USE TO TEST BLOOD SUGAR 3 TIMES A DAY AS NEEDED FOR SIGNS/ SYMPTOMS OF HYPOGLYCEMIA SHAKINESS, SWEATING, BLURRED VISION. Dx E11.9  . insulin aspart (NOVOLOG FLEXPEN) 100 UNIT/ML FlexPen Inject 10 Units into the skin 3 (three) times daily with meals. Needs to be seen for future refills.  . Insulin Pen Needle (TRUEPLUS PEN NEEDLES) 31G X 8 MM MISC USE TO INJECT XULTOPHY DAILY  . lisinopril (ZESTRIL) 10 MG tablet TAKE (1) TABLET BY MOUTH ONCE DAILY FOR PROTEINURIA  . metFORMIN (GLUCOPHAGE) 1000 MG tablet TAKE 1 TABLET BY MOUTH TWICE DAILY WITH MEALS.  . [name] Kitchenmeprazole (PRILOSEC) 20 MG capsule Take 1 capsule (20 mg total) by mouth daily. (Needs to be seen before next refill)  . PARoxetine (PAXIL) 20 MG tablet Take 20 mg by mouth every morning.  . [name] Kitchenhenylephrine-DM-GG (TUSSIN CF PO) Take 5 mLs by mouth 4 (four) times daily as needed.  . [name] KitchenUEtiapine (SEROQUEL XR) 300 MG 24 hr tablet Take 300 mg by mouth daily. Taken daily at 4pm  . simvastatin (ZOCOR) 40 MG tablet TAKE (1) TABLET BY MOUTH ONCE DAILY.  . tamsulosin (FLOMAX) 0.4 MG CAPS capsule TAKE 1 CAPSULE BY MOUTH ONCE A DAY.  . [name] Kitchenolnaftate (TINACTIN) 1 % spray Apply topically 2 (two) times daily.  . [name] Kitchenriamcinolone ointment (KENALOG) 0.5 % APPLY TOPICALLY TWICE DAILY.  . [name] KitchenASCEPA 1 g capsule TAKE (2) CAPSULES BY MOUTH TWICE  DAILY.  . XULTOPHY 100-3.6 UNIT-MG/ML SOPN INJECT 45 UNITS INTO SKIN ONCE DAILY.   No facility-administered encounter medications on file as of 09/13/2019.    Goals    . Client will talk with LCSW about feelings of anger at being at [facility] (pt-stated)     Current Barriers:  [name] Kitchen Mental Health Concerns in patient with Chronic Diagnoses of CKD, Type 2 DM, HTN, GERD, and Depression . Social Isolation  . Self Harm History  Clinical Social Work Clinical Goal(s):  [name] Kitchen Over the next 30 days, client will work with LCSW to address concerns related to  client's anger at being placed at [facility]  Interventions:  Talked with client about his participation in group activities at [facility]  Talked with David Zamora about health needs of his mother  Talked with David Zamora about recent fall on his knee (he said he is resting in bed now and has some knee pain;client said he fell about 4 days ago)  Talked with client about activities at the facility  Collaborated with [facility] Triage nurse about nursing needs of client  Patient Self Care Activities:  . Attends all scheduled provider appointments . Attends church or other social activities  Plan:  . Patient will call LCSW , as needed, to discuss client feelings of anger and managing feelings of anger  . Patient will use strategies discussed to help him manage anger outbursts . Patient will attend appointments as scheduled with psychiatrist. . Patient will communicate needs to care provider staff at [facility] . LCSW to call client in next 4 weeks to discuss client management of anger issues regarding current placement  Initial goal documentation    Follow Up Plan: LCSW to call client in next 4 weeks to discuss client management of anger issues regarding current placement  David Zamora.David Zamora MSW, LCSW Licensed Clinical Social Worker [facility] [phone]

## 2019-09-13 NOTE — Patient Instructions (Addendum)
Licensed Clinical Social Worker Visit Information  Goals we discussed today:    Talked with client about his participation in group activities at [place]  Talked with [name] about health needs of his mother  Talked with [name] about recent fall on his knee (he said he is resting in bed now and has some knee pain;client said he fell about 4 days ago)  Talked with client about activities at the facility   Collaborated with [facility] Triage nurse about nursing needs of client  Materials Provided: No  Follow Up Plan: LCSW to call client in next 4 weeks to discuss client management of anger issues regarding current placement  The patient verbalized understanding of instructions provided today and declined a print copy of patient instruction materials.   [name].David Zamora MSW, LCSW Licensed Clinical Social Worker [facility] [phone]

## 2019-09-15 ENCOUNTER — Other Ambulatory Visit: Payer: Self-pay

## 2019-09-15 ENCOUNTER — Other Ambulatory Visit: Payer: Medicare Other

## 2019-09-15 DIAGNOSIS — E1122 Type 2 diabetes mellitus with diabetic chronic kidney disease: Secondary | ICD-10-CM | POA: Diagnosis not present

## 2019-09-15 DIAGNOSIS — D631 Anemia in chronic kidney disease: Secondary | ICD-10-CM | POA: Diagnosis not present

## 2019-09-15 DIAGNOSIS — E872 Acidosis: Secondary | ICD-10-CM | POA: Diagnosis not present

## 2019-09-15 DIAGNOSIS — E211 Secondary hyperparathyroidism, not elsewhere classified: Secondary | ICD-10-CM | POA: Diagnosis not present

## 2019-09-15 DIAGNOSIS — N189 Chronic kidney disease, unspecified: Secondary | ICD-10-CM | POA: Diagnosis not present

## 2019-09-15 DIAGNOSIS — E1129 Type 2 diabetes mellitus with other diabetic kidney complication: Secondary | ICD-10-CM | POA: Diagnosis not present

## 2019-09-15 DIAGNOSIS — E875 Hyperkalemia: Secondary | ICD-10-CM | POA: Diagnosis not present

## 2019-09-15 DIAGNOSIS — R809 Proteinuria, unspecified: Secondary | ICD-10-CM | POA: Diagnosis not present

## 2019-09-16 ENCOUNTER — Ambulatory Visit: Payer: Medicare Other | Admitting: Nurse Practitioner

## 2019-09-20 ENCOUNTER — Encounter: Payer: Self-pay | Admitting: Family Medicine

## 2019-09-22 ENCOUNTER — Ambulatory Visit (INDEPENDENT_AMBULATORY_CARE_PROVIDER_SITE_OTHER): Payer: Medicare Other | Admitting: Family Medicine

## 2019-09-22 ENCOUNTER — Encounter: Payer: Self-pay | Admitting: Family Medicine

## 2019-09-22 ENCOUNTER — Other Ambulatory Visit: Payer: Self-pay

## 2019-09-22 VITALS — BP 108/74 | HR 85 | Temp 97.7°F | Ht 65.0 in | Wt 218.5 lb

## 2019-09-22 DIAGNOSIS — N1832 Chronic kidney disease, stage 3b: Secondary | ICD-10-CM | POA: Diagnosis not present

## 2019-09-22 DIAGNOSIS — E162 Hypoglycemia, unspecified: Secondary | ICD-10-CM

## 2019-09-22 DIAGNOSIS — E1122 Type 2 diabetes mellitus with diabetic chronic kidney disease: Secondary | ICD-10-CM | POA: Diagnosis not present

## 2019-09-22 DIAGNOSIS — E781 Pure hyperglyceridemia: Secondary | ICD-10-CM | POA: Diagnosis not present

## 2019-09-22 DIAGNOSIS — I152 Hypertension secondary to endocrine disorders: Secondary | ICD-10-CM

## 2019-09-22 DIAGNOSIS — E1169 Type 2 diabetes mellitus with other specified complication: Secondary | ICD-10-CM

## 2019-09-22 DIAGNOSIS — N183 Chronic kidney disease, stage 3 unspecified: Secondary | ICD-10-CM | POA: Diagnosis not present

## 2019-09-22 DIAGNOSIS — K219 Gastro-esophageal reflux disease without esophagitis: Secondary | ICD-10-CM | POA: Diagnosis not present

## 2019-09-22 DIAGNOSIS — S86911A Strain of unspecified muscle(s) and tendon(s) at lower leg level, right leg, initial encounter: Secondary | ICD-10-CM

## 2019-09-22 DIAGNOSIS — E1159 Type 2 diabetes mellitus with other circulatory complications: Secondary | ICD-10-CM

## 2019-09-22 DIAGNOSIS — I10 Essential (primary) hypertension: Secondary | ICD-10-CM | POA: Diagnosis not present

## 2019-09-22 LAB — BAYER DCA HB A1C WAIVED: HB A1C (BAYER DCA - WAIVED): 6.4 % (ref <7.0)

## 2019-09-22 MED ORDER — FREESTYLE LIBRE 14 DAY SENSOR MISC: 1.0000 | 3 refills | Status: DC

## 2019-09-22 MED ORDER — IBUPROFEN 200 MG PO TABS: 400.0000 mg | ORAL_TABLET | Freq: Every day | ORAL | 0 refills | Status: AC

## 2019-09-22 MED ORDER — NOVOLOG FLEXPEN 100 UNIT/ML ~~LOC~~ SOPN: 8.0000 [IU] | PEN_INJECTOR | Freq: Three times a day (TID) | SUBCUTANEOUS | 3 refills | Status: DC | PRN

## 2019-09-22 MED ORDER — FREESTYLE LIBRE 14 DAY READER DEVI: 1.0000 | Freq: Four times a day (QID) | 1 refills | Status: DC

## 2019-09-22 NOTE — Progress Notes (Addendum)
BP 108/74   Pulse 85   Temp 97.7 F (36.5 C) (Temporal)   Ht _0  (1.651 m)   Wt 218 lb 8 oz (99.1 kg)   BMI 36.36 kg/m    Subjective:   Patient ID: David Zamora, male    DOB: 08-14-1960, 59 y.o.   MRN: [id number]  HPI: David Zamora is a 59 y.o. male presenting on 09/22/2019 for Medical Management of Chronic Issues   HPI Type 2 diabetes mellitus Patient comes in today for recheck of his diabetes. Patient has been currently taking NovoLog 3 times daily, has not been using it all of the time.  10 units, Xultophy 45 and glipizide and metformin. Patient is currently on an ACE inhibitor/ARB. Patient has seen an ophthalmologist this year. Patient denies any issues with their feet. The symptom started onset as an adult hypertension hyperlipidemia and CKD stage III ARE RELATED TO DM.   Hypertension Patient is currently on lisinopril, and their blood pressure today is 108/74. Patient denies any lightheadedness or dizziness. Patient denies headaches, blurred vision, chest pains, shortness of breath, or weakness. Denies any side effects from medication and is content with current medication.   GERD Patient is currently on omeprazole.  She denies any major symptoms or abdominal pain or belching or burping. She denies any blood in her stool or lightheadedness or dizziness.   Relevant past medical, surgical, family and social history reviewed and updated as indicated. Interim medical history since our last visit reviewed. Allergies and medications reviewed and updated.  Review of Systems  Constitutional: Negative for chills and fever.  Respiratory: Negative for shortness of breath and wheezing.   Cardiovascular: Negative for chest pain and leg swelling.  Musculoskeletal: Negative for back pain and gait problem.  Skin: Negative for rash.  Neurological: Negative for dizziness, weakness and light-headedness.  All other systems reviewed and are negative.   Per HPI unless specifically  indicated above   Allergies as of 09/22/2019      Reactions   Soap    White soaps      Medication List       Accurate as of September 22, 2019  9:55 AM. If you have any questions, ask your nurse or doctor.        blood glucose meter kit and supplies Kit Dispense based on patient and insurance preference. Use up to four times daily as directed. (FOR ICD-9 250.00, 250.01).   buPROPion 150 MG 24 hr tablet Commonly known as: WELLBUTRIN XL Take 150 mg by mouth every morning.   busPIRone 10 MG tablet Commonly known as: BUSPAR Take 10 mg by mouth 2 (two) times daily.   CeraVe Crea APPLY 1 APPLICATION ONCE A DAY TO BOTH FEET   divalproex 250 MG 24 hr tablet Commonly known as: DEPAKOTE ER Take 250-1,000 mg by mouth See admin instructions. 239m in the morning and 10045m at bedtime   fluticasone 50 MCG/ACT nasal spray Commonly known as: FLONASE   FORA Lancets Misc USE TO TEST BLOOD SUGAR 3 TIMES A DAY USE TO TEST BLOOD SUGAR AS NEEDED FOR SIGNS/SYMPTOMS OF HYPOGLYCEMIA (SHAKINESS, SWEATING, BLURRED VIS   FORA V30a Blood Glucose Test test strip Generic drug: glucose blood USE TO TEST BLOOD SUGAR 3 TIMES A DAY AS NEEDED FOR SIGNS/ SYMPTOMS OF HYPOGLYCEMIA SHAKINESS, SWEATING, BLURRED VISION. Dx E11.9   furosemide 20 MG tablet Commonly known as: LASIX Take 20 mg by mouth daily as needed (for weight gain > 3 lbs).  glipiZIDE 10 MG 24 hr tablet Commonly known as: GLUCOTROL XL TAKE (1) TABLET BY MOUTH ONCE DAILY.   lisinopril 10 MG tablet Commonly known as: ZESTRIL TAKE (1) TABLET BY MOUTH ONCE DAILY FOR PROTEINURIA   metFORMIN 1000 MG tablet Commonly known as: GLUCOPHAGE TAKE 1 TABLET BY MOUTH TWICE DAILY WITH MEALS.   NovoLOG FlexPen 100 UNIT/ML FlexPen Generic drug: insulin aspart Inject 10 Units into the skin 3 (three) times daily with meals. Needs to be seen for future refills.   omeprazole 20 MG capsule Commonly known as: PRILOSEC Take 1 capsule (20 mg total) by  mouth daily. (Needs to be seen before next refill)   PARoxetine 20 MG tablet Commonly known as: PAXIL Take 20 mg by mouth every morning.   QUEtiapine 300 MG 24 hr tablet Commonly known as: SEROQUEL XR Take 300 mg by mouth daily. Taken daily at 4pm   Refresh Optive Advanced 0.5-1-0.5 % Soln Generic drug: Carboxymeth-Glycerin-Polysorb Apply 1 drop to eye 4 (four) times daily.   simvastatin 40 MG tablet Commonly known as: ZOCOR TAKE (1) TABLET BY MOUTH ONCE DAILY.   tamsulosin 0.4 MG Caps capsule Commonly known as: FLOMAX TAKE 1 CAPSULE BY MOUTH ONCE A DAY.   tolnaftate 1 % spray Commonly known as: TINACTIN Apply topically 2 (two) times daily.   triamcinolone ointment 0.5 % Commonly known as: KENALOG APPLY TOPICALLY TWICE DAILY.   TRUEplus Pen Needles 31G X 8 MM Misc Generic drug: Insulin Pen Needle USE TO INJECT XULTOPHY DAILY   TUSSIN CF PO Take 5 mLs by mouth 4 (four) times daily as needed.   Vascepa 1 g capsule Generic drug: icosapent Ethyl TAKE (2) CAPSULES BY MOUTH TWICE DAILY.   Vitamin D3 1.25 MG (50000 UT) Tabs Take 50,000 Units by mouth every Saturday.   Xultophy 100-3.6 UNIT-MG/ML Sopn Generic drug: Insulin Degludec-Liraglutide INJECT 45 UNITS INTO SKIN ONCE DAILY.        Objective:   Ht _0  (1.651 m)   Wt 218 lb 8 oz (99.1 kg)   BMI 36.36 kg/m   Wt Readings from Last 3 Encounters:  09/22/19 218 lb 8 oz (99.1 kg)  05/13/19 217 lb (98.4 kg)  11/29/18 221 lb (100.2 kg)    Physical Exam Vitals and nursing note reviewed.  Constitutional:      General: He is not in acute distress.    Appearance: He is well-developed. He is not diaphoretic.  Eyes:     General: No scleral icterus.    Conjunctiva/sclera: Conjunctivae normal.  Neck:     Thyroid: No thyromegaly.  Cardiovascular:     Rate and Rhythm: Normal rate and regular rhythm.     Heart sounds: Normal heart sounds. No murmur heard.   Pulmonary:     Effort: Pulmonary effort is normal.  No respiratory distress.     Breath sounds: Normal breath sounds. No wheezing.  Musculoskeletal:        General: Normal range of motion.     Cervical back: Neck supple.  Lymphadenopathy:     Cervical: No cervical adenopathy.  Skin:    General: Skin is warm and dry.     Findings: No rash.  Neurological:     Mental Status: He is alert and oriented to person, place, and time.     Coordination: Coordination normal.  Psychiatric:        Behavior: Behavior normal.       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  A1c 6.4, is doing well except for there are some concerns that he is having hypoglycemic episodes, hopefully the freestyle Elenor Legato will give [place]. -patient is testing blood sugar 4-6 times daily -patient is injecting insulin 4 times daily -patient is making adjustments to insulin based on blood sugar readings -patient would benefit from CGM  system (I.e. Libre or Dexcom)  To see if he really is having hypoglycemic episodes.   Follow up plan: Return in about 3 months (around 12/23/2019), or if symptoms worsen or fail to improve, for Diabetes recheck.  Counseling provided for all of the vaccine components Orders Placed This Encounter  Procedures  . Bayer [facility] Hb A1c [place], MD [facility] 09/22/2019, 9:55 AM

## 2019-09-23 ENCOUNTER — Telehealth: Payer: Self-pay | Admitting: Family Medicine

## 2019-09-23 LAB — BMP8+EGFR
BUN/Creatinine Ratio: 13 (ref 10–24)
BUN: 28 mg/dL — ABNORMAL HIGH (ref 8–27)
CO2: 24 mmol/L (ref 20–29)
Calcium: 9.5 mg/dL (ref 8.6–10.2)
Chloride: 105 mmol/L (ref 96–106)
Creatinine, Ser: 2.16 mg/dL — ABNORMAL HIGH (ref 0.76–1.27)
GFR calc Af Amer: 37 mL/min/{1.73_m2} — ABNORMAL LOW (ref >59)
GFR calc non Af Amer: 32 mL/min/{1.73_m2} — ABNORMAL LOW (ref >59)
Glucose: 97 mg/dL (ref 65–99)
Potassium: 5.8 mmol/L — ABNORMAL HIGH (ref 3.5–5.2)
Sodium: 142 mmol/L (ref 134–144)

## 2019-09-23 NOTE — Telephone Encounter (Signed)
David Zamora can you see if there is a way that we could possibly help this guy get freestyle libre, I do think he is going to come in to see you, I think we set him up an appointment with you.

## 2019-09-23 NOTE — Telephone Encounter (Signed)
Pharmacy states the test strips and lancets for the Freestyle Orlene Plum is to expensive with Medicare and requesting that another brand be sent in for the pt.

## 2019-09-28 ENCOUNTER — Other Ambulatory Visit: Payer: Self-pay

## 2019-09-28 ENCOUNTER — Ambulatory Visit (INDEPENDENT_AMBULATORY_CARE_PROVIDER_SITE_OTHER): Payer: Medicare Other | Admitting: Pharmacist

## 2019-09-28 DIAGNOSIS — E1169 Type 2 diabetes mellitus with other specified complication: Secondary | ICD-10-CM | POA: Diagnosis not present

## 2019-09-28 DIAGNOSIS — E1122 Type 2 diabetes mellitus with diabetic chronic kidney disease: Secondary | ICD-10-CM

## 2019-09-28 DIAGNOSIS — E162 Hypoglycemia, unspecified: Secondary | ICD-10-CM | POA: Diagnosis not present

## 2019-09-28 DIAGNOSIS — E781 Pure hyperglyceridemia: Secondary | ICD-10-CM | POA: Diagnosis not present

## 2019-09-28 DIAGNOSIS — N183 Chronic kidney disease, stage 3 unspecified: Secondary | ICD-10-CM

## 2019-09-28 MED ORDER — GLIPIZIDE ER 5 MG PO TB24: 5.0000 mg | ORAL_TABLET | Freq: Every day | ORAL | 4 refills | Status: DC

## 2019-09-28 MED ORDER — "TEGADERM FILM 6""X8"" MISC": 3 refills | Status: AC

## 2019-09-28 MED ORDER — METFORMIN HCL 500 MG PO TABS: 500.0000 mg | ORAL_TABLET | Freq: Two times a day (BID) | ORAL | 3 refills | Status: DC

## 2019-09-28 MED ORDER — FREESTYLE LIBRE 14 DAY SENSOR MISC: 1.0000 | 3 refills | Status: DC

## 2019-09-28 MED ORDER — FREESTYLE LIBRE 14 DAY READER DEVI: 1.0000 | Freq: Four times a day (QID) | 1 refills | Status: DC

## 2019-09-28 NOTE — Progress Notes (Signed)
    09/28/2019 Name: David Zamora MRN: [id number] DOB: 01/31/1961   S:  70 yoM Presents for diabetes evaluation, education, and management.  Caregiver was present for this visit. Patient was referred and last seen by Primary Care Provider on 09/22/19.  Insurance coverage/medication affordability: MEDICARE/MEDICAID  Patient reports adherence with medications.  Patient is given medications as administered by group home. . Current diabetes medications include: Xutolphy, glipizide, novolog, metfomin . Current hypertension medications include: lisinopril Goal 130/80 . Current hyperlipidemia medications include: simvastatin   Patient reports hypoglycemic events.   Patient reported dietary habits: Eats 2-3 meals/day  Patient-reported exercise habits: n/a  O:  Lab Results  Component Value Date   HGBA1C 6.4 09/22/2019   Lipid Panel     Component Value Date/Time   CHOL 146 05/18/2019 1538   TRIG 257 (H) 05/18/2019 1538   HDL 33 (L) 05/18/2019 1538   CHOLHDL 4.4 05/18/2019 1538   LDLCALC 71 05/18/2019 1538   Home fasting blood sugars: FBG BG 273 ate lunch after 12pm---BG was 65 so corrected with cookies Lows at lunchtime    A/P:  Diabetes T2DM currently having hypoglycemia. Discussed appropriate hypoglycemia management plan.    Decrease Xutolphy to 40 units in the AM  Decrease metformin to 500 mg BID per renal function  Decrease glipizide XL to 5mg  with breakfast   Libre 2 system sample placed-Extensively discussed pathophysiology of diabetes, recommended lifestyle interventions, dietary effects on blood sugar control  -Counseled on s/sx of and management of hypoglycemia   Written patient instructions provided.  Total time in face to face counseling 30 minutes.    Regina Eck, PharmD, BCPS Clinical Pharmacist, [place]  II Phone [phone]

## 2019-10-04 ENCOUNTER — Ambulatory Visit (INDEPENDENT_AMBULATORY_CARE_PROVIDER_SITE_OTHER): Payer: Medicare Other | Admitting: Pharmacist

## 2019-10-04 ENCOUNTER — Other Ambulatory Visit: Payer: Self-pay

## 2019-10-04 DIAGNOSIS — E781 Pure hyperglyceridemia: Secondary | ICD-10-CM

## 2019-10-04 DIAGNOSIS — E1169 Type 2 diabetes mellitus with other specified complication: Secondary | ICD-10-CM

## 2019-10-04 NOTE — Progress Notes (Signed)
     09/14/2019 Name: David Zamora MRN: [id number] DOB: 04/20/1960   S:  15 yoM Presents for diabetes evaluation, education, and management.  Caregiver was present for this visit.  Patient is still having hypoglycemia. Patient was referred and last seen by Primary Care Provider on 09/22/19.  Insurance coverage/medication affordability: MEDICARE/MEDICAID  Patient reports adherence with medications.  Patient is given medications as administered by group home.  Current diabetes medications include: Xutolphy, glipizide, novolog, metfomin  Current hypertension medications include: lisinopril Goal 130/80  Current hyperlipidemia medications include: simvastatin   Patient reports hypoglycemic events.   Patient reported dietary habits: Eats 2-3 meals/day  Patient-reported exercise habits: n/a    O:  Lab Results  Component Value Date   HGBA1C 6.4 09/22/2019   Lipid Panel     Component Value Date/Time   CHOL 146 05/18/2019 1538   TRIG 257 (H) 05/18/2019 1538   HDL 33 (L) 05/18/2019 1538   CHOLHDL 4.4 05/18/2019 1538   LDLCALC 71 05/18/2019 1538   Lows at lunchtime--2 episodes of hypoglycemia is 1 week     A/P:  Diabetes T2DM currently having hypoglycemia. Discussed appropriate hypoglycemia management plan.    DECREASE xulotphy to 35 units in the AM  CONTINUE metformin to 500 mg BID per renal fcn  DISCONTINUE glipizide XL  CONTINUE Libre2 CGM system   Extensively discussed pathophysiology of diabetes, recommended lifestyle interventions, dietary effects on blood sugar control  -Counseled on s/sx of and management of hypoglycemia   Written patient instructions provided.  Total time in face to face counseling 25 minutes.   Follow up Pharmacist in 2 weeks Regina Eck, PharmD, BCPS Clinical Pharmacist, [place]  II Phone [phone]

## 2019-10-09 ENCOUNTER — Encounter: Payer: Self-pay | Admitting: Pharmacist

## 2019-10-14 ENCOUNTER — Other Ambulatory Visit: Payer: Self-pay | Admitting: Family Medicine

## 2019-10-18 ENCOUNTER — Ambulatory Visit (INDEPENDENT_AMBULATORY_CARE_PROVIDER_SITE_OTHER): Payer: Medicare Other | Admitting: Licensed Clinical Social Worker

## 2019-10-18 DIAGNOSIS — E1159 Type 2 diabetes mellitus with other circulatory complications: Secondary | ICD-10-CM

## 2019-10-18 DIAGNOSIS — E781 Pure hyperglyceridemia: Secondary | ICD-10-CM

## 2019-10-18 DIAGNOSIS — K219 Gastro-esophageal reflux disease without esophagitis: Secondary | ICD-10-CM

## 2019-10-18 DIAGNOSIS — F3341 Major depressive disorder, recurrent, in partial remission: Secondary | ICD-10-CM | POA: Diagnosis not present

## 2019-10-18 DIAGNOSIS — N1832 Chronic kidney disease, stage 3b: Secondary | ICD-10-CM

## 2019-10-18 DIAGNOSIS — I10 Essential (primary) hypertension: Secondary | ICD-10-CM

## 2019-10-18 DIAGNOSIS — E1169 Type 2 diabetes mellitus with other specified complication: Secondary | ICD-10-CM | POA: Diagnosis not present

## 2019-10-18 DIAGNOSIS — I152 Hypertension secondary to endocrine disorders: Secondary | ICD-10-CM

## 2019-10-18 NOTE — Patient Instructions (Addendum)
Licensed Clinical Education officer, museum Visit Information  Goals we discussed today:      Client will talk with LCSW about feelings of anger at being at [name] (pt-stated)        Current Barriers:   Mental Health Concerns in patient with Chronic Diagnoses of CKD, Type 2 DM, HTN, GERD, and Depression  Social Isolation   Self Harm History  Clinical Social Work Clinical Goal(s):   Over the next 30 days, client will work with LCSW to address concerns related to client's anger at being placed at [facility]  Interventions:    Talked with client about his participation in group activities at [facility]  Talked with Aldo about health needs of his mother  Talked with client about activities at the facility  Talked with client about pain issues in his right knee  Talked with Gearald about recreational activities (he said that group from the Group home went to see a movie recently)  Talked with Misty about sleeping issues of client  Talked with Tycho about anger management for client  Talked with Melroy about strategies for managing anger issues  Talked with Kasandra Knudsen about appetite of client  Collaborated with Lottie Dawson, Pharm. D today about Free style Elenor Legato for client (collaboration was requested by client today. Almyra Free said she would call supplier and check on status of [place] for client)  Patient Self Care Activities:   Attends all scheduled provider appointments  Attends church or other social activities  Plan:   Patient will call LCSW , as needed, to discuss client feelings of anger and managing feelings of anger   Patient will use strategies discussed to help him manage anger outbursts  Patient will attend appointments as scheduled with psychiatrist.  Patient will communicate needs to care provider staff at [facility]  LCSW to call client in next 4 weeks to discuss client management of anger issues regarding current  placement  Initial goal documentation   Follow Up Plan: LCSW to call client in next 4 weeks to discuss client management of anger issues regarding current placement  Materials Provided: No  The patient verbalized understanding of instructions provided today and declined a print copy of patient instruction materials.   [name].Rilley Poulter MSW, LCSW Licensed Clinical Social Worker [facility] Care Management [phone]

## 2019-10-18 NOTE — Chronic Care Management (AMB) (Signed)
Chronic Care Management    Clinical Social Work Follow Up Note  10/18/2019 Name: David Zamora MRN: [id number] DOB: 1961-01-04  David Zamora is a 59 y.o. year old male who is a primary care patient of Dettinger, Fransisca Kaufmann, MD. The CCM team was consulted for assistance with [employer] .   Review of patient status, including review of consultants reports, other relevant assessments, and collaboration with appropriate care team members and the patient's provider was performed as part of comprehensive patient evaluation and provision of chronic care management services.    SDOH (Social Determinants of Health) assessments performed: No; risk for tobacco use; risk for depression; risk for stress; risk for social isolation    Office Visit from 09/22/2019 in [facility]  PHQ-9 Total Score 6       GAD 7 : Generalized Anxiety Score 09/22/2019  Nervous, Anxious, on Edge 1  Control/stop worrying 0  Worry too much - different things 0  Trouble relaxing 0  Restless 0  Easily annoyed or irritable 1  Afraid - awful might happen 1  Total GAD 7 Score 3  Anxiety Difficulty Somewhat difficult    Outpatient Encounter Medications as of 10/18/2019  Medication Sig  . blood glucose meter kit and supplies KIT Dispense based on patient and insurance preference. Use up to four times daily as directed. (FOR ICD-9 250.00, 250.01).  [name] Kitchen buPROPion (WELLBUTRIN XL) 150 MG 24 hr tablet Take 150 mg by mouth every morning.   . busPIRone (BUSPAR) 10 MG tablet Take 10 mg by mouth 2 (two) times daily.  . Carboxymeth-Glycerin-Polysorb (REFRESH OPTIVE ADVANCED) 0.5-1-0.5 % SOLN Apply 1 drop to eye 4 (four) times daily.  . Cholecalciferol (VITAMIN D3) 50000 units TABS Take 50,000 Units by mouth every Saturday.   . Continuous Blood Gluc Receiver (FREESTYLE LIBRE 14 DAY READER) DEVI 1 each by Does not apply route 4 (four) times daily.  . Continuous Blood Gluc Sensor (FREESTYLE LIBRE 14 DAY SENSOR)  MISC 1 each by Does not apply route every 14 (fourteen) days.  . divalproex (DEPAKOTE ER) 250 MG 24 hr tablet Take 250-1,000 mg by mouth See admin instructions. 243m in the morning and 10040m at bedtime  . Emollient (CERAVE) CREA APPLY 1 APPLICATION ONCE A DAY TO BOTH FEET  . fluticasone (FLONASE) 50 MCG/ACT nasal spray 1 SPRAY EACH NOSTRIL TWICE DAILY AS NEEDED FOR ALLERGIES OR RHINITIS.  . [name] KitchenORA Lancets MISC USE TO TEST BLOOD SUGAR 3 TIMES A DAY USE TO TEST BLOOD SUGAR AS NEEDED FOR SIGNS/SYMPTOMS OF HYPOGLYCEMIA (SHAKINESS, SWEATING, BLURRED VIS  . furosemide (LASIX) 20 MG tablet Take 20 mg by mouth daily as needed (for weight gain > 3 lbs).  . [name] Kitchenlucose blood (FORA V30A BLOOD GLUCOSE TEST) test strip USE TO TEST BLOOD SUGAR 3 TIMES A DAY AS NEEDED FOR SIGNS/ SYMPTOMS OF HYPOGLYCEMIA SHAKINESS, SWEATING, BLURRED VISION. Dx E11.9  . ibuprofen (MOTRIN IB) 200 MG tablet Take 2 tablets (400 mg total) by mouth daily.  . insulin aspart (NOVOLOG FLEXPEN) 100 UNIT/ML FlexPen Inject 8 Units into the skin 3 (three) times daily as needed for high blood sugar (Give for high sugars with meals, if <100 then hold).  . Insulin Degludec-Liraglutide (XULTOPHY) 100-3.6 UNIT-MG/ML SOPN Inject 35 Units into the skin daily.  . Insulin Pen Needle (TRUEPLUS PEN NEEDLES) 31G X 8 MM MISC USE TO INJECT XULTOPHY DAILY  . lisinopril (ZESTRIL) 10 MG tablet TAKE (1) TABLET BY MOUTH ONCE DAILY FOR PROTEINURIA  .  metFORMIN (GLUCOPHAGE) 500 MG tablet Take 1 tablet (500 mg total) by mouth 2 (two) times daily with a meal.  . omeprazole (PRILOSEC) 20 MG capsule Take 1 capsule (20 mg total) by mouth daily. (Needs to be seen before next refill)  . PARoxetine (PAXIL) 20 MG tablet Take 20 mg by mouth every morning.  [name] Kitchen Phenylephrine-DM-GG (TUSSIN CF PO) Take 5 mLs by mouth 4 (four) times daily as needed.  [name] Kitchen QUEtiapine (SEROQUEL XR) 300 MG 24 hr tablet Take 300 mg by mouth daily. Taken daily at 4pm  . simvastatin (ZOCOR) 40 MG tablet TAKE  (1) TABLET BY MOUTH ONCE DAILY.  . tamsulosin (FLOMAX) 0.4 MG CAPS capsule TAKE 1 CAPSULE BY MOUTH ONCE A DAY.  [name] Kitchen tolnaftate (TINACTIN) 1 % spray Apply topically 2 (two) times daily.  . Transparent Dressings (TEGADERM FILM 6"X8") MISC Use to cover free style libre sensor  . triamcinolone ointment (KENALOG) 0.5 % APPLY TOPICALLY TWICE DAILY.  [name] Kitchen VASCEPA 1 g capsule TAKE (2) CAPSULES BY MOUTH TWICE DAILY.   No facility-administered encounter medications on file as of 10/18/2019.    Goals    .  Client will talk with LCSW about feelings of anger at being at [name] (pt-stated)      Current Barriers:  [name] Kitchen Mental Health Concerns in patient with Chronic Diagnoses of CKD, Type 2 DM, HTN, GERD, and Depression . Social Isolation  . Self Harm History  Clinical Social Work Clinical Goal(s):  [name] Kitchen Over the next 30 days, client will work with LCSW to address concerns related to client's anger at being placed at [facility]  Interventions:    Talked with client about his participation in group activities at [facility]  Talked with [place] about health needs of his mother  Talked with client about activities at the facility  Talked with client about pain issues in his right knee  Talked with Genesis about recreational activities (he said that group from the Group home went to see a movie recently)  Talked with Deago about sleeping issues of client  Talked with Favor about anger management for client  Talked with Japhet about strategies for managing anger issues  Talked with Kasandra Knudsen about appetite of client  Collaborated with Lottie Dawson, Pharm. D today about Free style Elenor Legato for client (collaboration was requested by client today. Almyra Free said she would call supplier and check on status of [place] for client)  Patient Self Care Activities:  . Attends all scheduled provider appointments . Attends church or other social activities  Plan:  . Patient will call LCSW , as  needed, to discuss client feelings of anger and managing feelings of anger  . Patient will use strategies discussed to help him manage anger outbursts . Patient will attend appointments as scheduled with psychiatrist. . Patient will communicate needs to care provider staff at [facility] . LCSW to call client in next 4 weeks to discuss client management of anger issues regarding current placement  Initial goal documentation   Follow Up Plan: LCSW to call client in next 4 weeks to discuss client management of anger issues regarding current placement  [name].Charmon Thorson MSW, LCSW Licensed Clinical Social Worker [facility] Care Management [phone]

## 2019-10-28 ENCOUNTER — Telehealth: Payer: Self-pay | Admitting: Pharmacist

## 2019-10-28 NOTE — Telephone Encounter (Signed)
Incoming fax from [facility] [phone] Requires CGM questions/response Form completed and faxed back to [facility]

## 2019-11-09 ENCOUNTER — Other Ambulatory Visit: Payer: Self-pay | Admitting: Family Medicine

## 2019-11-11 ENCOUNTER — Other Ambulatory Visit: Payer: Medicare Other

## 2019-11-11 ENCOUNTER — Other Ambulatory Visit: Payer: Self-pay

## 2019-11-11 DIAGNOSIS — E211 Secondary hyperparathyroidism, not elsewhere classified: Secondary | ICD-10-CM | POA: Diagnosis not present

## 2019-11-11 DIAGNOSIS — N189 Chronic kidney disease, unspecified: Secondary | ICD-10-CM | POA: Diagnosis not present

## 2019-11-11 DIAGNOSIS — D631 Anemia in chronic kidney disease: Secondary | ICD-10-CM | POA: Diagnosis not present

## 2019-11-11 DIAGNOSIS — E872 Acidosis: Secondary | ICD-10-CM | POA: Diagnosis not present

## 2019-11-11 DIAGNOSIS — R809 Proteinuria, unspecified: Secondary | ICD-10-CM | POA: Diagnosis not present

## 2019-11-11 DIAGNOSIS — E875 Hyperkalemia: Secondary | ICD-10-CM | POA: Diagnosis not present

## 2019-11-11 DIAGNOSIS — E1129 Type 2 diabetes mellitus with other diabetic kidney complication: Secondary | ICD-10-CM | POA: Diagnosis not present

## 2019-11-11 DIAGNOSIS — E1122 Type 2 diabetes mellitus with diabetic chronic kidney disease: Secondary | ICD-10-CM | POA: Diagnosis not present

## 2019-11-14 DIAGNOSIS — E1165 Type 2 diabetes mellitus with hyperglycemia: Secondary | ICD-10-CM | POA: Diagnosis not present

## 2019-11-18 ENCOUNTER — Ambulatory Visit (INDEPENDENT_AMBULATORY_CARE_PROVIDER_SITE_OTHER): Payer: Medicare Other | Admitting: Licensed Clinical Social Worker

## 2019-11-18 DIAGNOSIS — I10 Essential (primary) hypertension: Secondary | ICD-10-CM

## 2019-11-18 DIAGNOSIS — K219 Gastro-esophageal reflux disease without esophagitis: Secondary | ICD-10-CM

## 2019-11-18 DIAGNOSIS — I152 Hypertension secondary to endocrine disorders: Secondary | ICD-10-CM

## 2019-11-18 DIAGNOSIS — F3341 Major depressive disorder, recurrent, in partial remission: Secondary | ICD-10-CM | POA: Diagnosis not present

## 2019-11-18 DIAGNOSIS — E781 Pure hyperglyceridemia: Secondary | ICD-10-CM | POA: Diagnosis not present

## 2019-11-18 DIAGNOSIS — E1169 Type 2 diabetes mellitus with other specified complication: Secondary | ICD-10-CM

## 2019-11-18 DIAGNOSIS — E1159 Type 2 diabetes mellitus with other circulatory complications: Secondary | ICD-10-CM | POA: Diagnosis not present

## 2019-11-18 DIAGNOSIS — N1832 Chronic kidney disease, stage 3b: Secondary | ICD-10-CM

## 2019-11-18 NOTE — Patient Instructions (Addendum)
Licensed Clinical Education officer, museum Visit Information  Goals we discussed today:     Client will talk with LCSW about feelings of anger at being at [name] (pt-stated)         Current Barriers:   Mental Health Concerns in patient with Chronic Diagnoses of CKD, Type 2 DM, HTN, GERD, and Depression  Social Isolation   Self Harm History  Clinical Social Work Clinical Goal(s):   Over the next 30 days, client will work with LCSW to address concerns related to client's anger at being placed at [facility]  Interventions:  Talked with Windel about his support from [facility] pharmacist, Dr. Lottie Dawson  Talked with client about strategies for managing anger outbursts of client  Talked with client about social support network   Talked with client about recreational activities of choice (watching birds, listening to music, watching favorite TV shows)  Talked with client about pain issues in his knee (he said he is resting more in bed )  Talked with Sony about ambulation (he said his knee hurts but he moves slowly to walk and can walk short distances)  Talked with Virgel about his recent visit to see his mother  Talked with Brendt about his use of CGM.  Talked with Kevron about his support from [place], [place]  Patient Self Care Activities:   Attends all scheduled provider appointments  Attends church or other social activities  Plan:   Patient will call LCSW , as needed, to discuss client feelings of anger and managing feelings of anger   Patient will use strategies discussed to help him manage anger outbursts  Patient will attend appointments as scheduled with psychiatrist.  Patient will communicate needs to care provider staff at [facility]  LCSW to call client in next 4 weeks to discuss client management of anger issues regarding current placement  Initial goal documentation   Follow Up Plan: LCSW to call client in next 4 weeks to  discuss client management of anger issues regarding current placement  Materials Provided: No  The patient verbalized understanding of instructions provided today and declined a print copy of patient instruction materials.   [name].Darbie Biancardi MSW, LCSW Licensed Clinical Social Worker [facility] Care Management [phone]

## 2019-11-18 NOTE — Chronic Care Management (AMB) (Signed)
Chronic Care Management    Clinical Social Work Follow Up Note  11/18/2019 Name: David Zamora MRN: [id number] DOB: June 19, 1960  David Zamora is a 59 y.o. year old male who is a primary care patient of Dettinger, Fransisca Kaufmann, MD. The CCM team was consulted for assistance with [employer] .   Review of patient status, including review of consultants reports, other relevant assessments, and collaboration with appropriate care team members and the patient's provider was performed as part of comprehensive patient evaluation and provision of chronic care management services.    SDOH (Social Determinants of Health) assessments performed: No; risk of tobacco use; risk of depression; risk of physical inactivity;risk of stress    Office Visit from 09/22/2019 in [facility]  PHQ-9 Total Score 6     GAD 7 : Generalized Anxiety Score 09/22/2019  Nervous, Anxious, on Edge 1  Control/stop worrying 0  Worry too much - different things 0  Trouble relaxing 0  Restless 0  Easily annoyed or irritable 1  Afraid - awful might happen 1  Total GAD 7 Score 3  Anxiety Difficulty Somewhat difficult    Outpatient Encounter Medications as of 11/18/2019  Medication Sig  . blood glucose meter kit and supplies KIT Dispense based on patient and insurance preference. Use up to four times daily as directed. (FOR ICD-9 250.00, 250.01).  [name] Kitchen buPROPion (WELLBUTRIN XL) 150 MG 24 hr tablet Take 150 mg by mouth every morning.   . busPIRone (BUSPAR) 10 MG tablet Take 10 mg by mouth 2 (two) times daily.  . Carboxymeth-Glycerin-Polysorb (REFRESH OPTIVE ADVANCED) 0.5-1-0.5 % SOLN Apply 1 drop to eye 4 (four) times daily.  . Cholecalciferol (VITAMIN D3) 50000 units TABS Take 50,000 Units by mouth every Saturday.   . Continuous Blood Gluc Receiver (FREESTYLE LIBRE 14 DAY READER) DEVI 1 each by Does not apply route 4 (four) times daily.  . Continuous Blood Gluc Sensor (FREESTYLE LIBRE 14 DAY SENSOR) MISC  1 each by Does not apply route every 14 (fourteen) days.  . divalproex (DEPAKOTE ER) 250 MG 24 hr tablet Take 250-1,000 mg by mouth See admin instructions. 227m in the morning and 10045m at bedtime  . Emollient (CERAVE) CREA APPLY 1 APPLICATION ONCE A DAY TO BOTH FEET  . fluticasone (FLONASE) 50 MCG/ACT nasal spray 1 SPRAY EACH NOSTRIL TWICE DAILY AS NEEDED FOR ALLERGIES OR RHINITIS.  . [name] KitchenORA Lancets MISC USE TO TEST BLOOD SUGAR 3 TIMES A DAY USE TO TEST BLOOD SUGAR AS NEEDED FOR SIGNS/SYMPTOMS OF HYPOGLYCEMIA (SHAKINESS, SWEATING, BLURRED VIS  . furosemide (LASIX) 20 MG tablet Take 20 mg by mouth daily as needed (for weight gain > 3 lbs).  . [name] Kitchenlucose blood (FORA V30A BLOOD GLUCOSE TEST) test strip USE TO TEST BLOOD SUGAR 3 TIMES A DAY AS NEEDED FOR SIGNS/ SYMPTOMS OF HYPOGLYCEMIA SHAKINESS, SWEATING, BLURRED VISION. Dx E11.9  . ibuprofen (MOTRIN IB) 200 MG tablet Take 2 tablets (400 mg total) by mouth daily.  . insulin aspart (NOVOLOG FLEXPEN) 100 UNIT/ML FlexPen Inject 8 Units into the skin 3 (three) times daily as needed for high blood sugar (Give for high sugars with meals, if <100 then hold).  . Insulin Pen Needle (TRUEPLUS PEN NEEDLES) 31G X 8 MM MISC USE TO INJECT XULTOPHY DAILY  . lisinopril (ZESTRIL) 10 MG tablet TAKE (1) TABLET BY MOUTH ONCE DAILY FOR PROTEINURIA  . metFORMIN (GLUCOPHAGE) 500 MG tablet Take 1 tablet (500 mg total) by mouth 2 (two) times daily  with a meal.  . omeprazole (PRILOSEC) 20 MG capsule TAKE 1 CAPSULE BY MOUTH ONCE DAILY.  [name] Kitchen PARoxetine (PAXIL) 20 MG tablet Take 20 mg by mouth every morning.  [name] Kitchen Phenylephrine-DM-GG (TUSSIN CF PO) Take 5 mLs by mouth 4 (four) times daily as needed.  [name] Kitchen QUEtiapine (SEROQUEL XR) 300 MG 24 hr tablet Take 300 mg by mouth daily. Taken daily at 4pm  . simvastatin (ZOCOR) 40 MG tablet TAKE (1) TABLET BY MOUTH ONCE DAILY.  . tamsulosin (FLOMAX) 0.4 MG CAPS capsule TAKE 1 CAPSULE BY MOUTH ONCE A DAY.  [name] Kitchen tolnaftate (TINACTIN) 1 % spray Apply  topically 2 (two) times daily.  . Transparent Dressings (TEGADERM FILM 6"X8") MISC Use to cover free style libre sensor  . triamcinolone ointment (KENALOG) 0.5 % APPLY TOPICALLY TWICE DAILY.  [name] Kitchen VASCEPA 1 g capsule TAKE (2) CAPSULES BY MOUTH TWICE DAILY.  . XULTOPHY 100-3.6 UNIT-MG/ML SOPN INJECT 35 UNITS INTO SKIN EVERY MORNING.   No facility-administered encounter medications on file as of 11/18/2019.     Goals    .  Client will talk with LCSW about feelings of anger at being at [name] (pt-stated)      Current Barriers:  [name] Kitchen Mental Health Concerns in patient with Chronic Diagnoses of CKD, Type 2 DM, HTN, GERD, and Depression . Social Isolation  . Self Harm History  Clinical Social Work Clinical Goal(s):  [name] Kitchen Over the next 30 days, client will work with LCSW to address concerns related to client's anger at being placed at [facility]  Interventions: . Talked with Alec about his support from [facility] pharmacist, Dr. Lottie Dawson . Talked with client about strategies for managing anger outbursts of client . Talked with client about social support network  . Talked with client about recreational activities of choice (watching birds, listening to music, watching favorite TV shows) . Talked with client about pain issues in his knee (he said he is resting more in bed ) . Talked with [place] about ambulation (he said his knee hurts but he moves slowly to walk and can walk short distances) . Talked with Darrik about his recent visit to see his mother . Talked with Jamel about his use of CGM. . Talked with Zymarion about his support from [place], Melissa Price  Patient Self Care Activities:  . Attends all scheduled provider appointments . Attends church or other social activities  Plan:  . Patient will call LCSW , as needed, to discuss client feelings of anger and managing feelings of anger  . Patient will use strategies discussed to help him manage anger outbursts . Patient will  attend appointments as scheduled with psychiatrist. . Patient will communicate needs to care provider staff at [facility] . LCSW to call client in next 4 weeks to discuss client management of anger issues regarding current placement  Initial goal documentation   Follow Up Plan: LCSW to call client in next 4 weeks to discuss client management of anger issues regarding current placement  [name].Thi Klich MSW, LCSW Licensed Clinical Social Worker [facility] Care [facility] [phone]

## 2019-11-23 DIAGNOSIS — F319 Bipolar disorder, unspecified: Secondary | ICD-10-CM | POA: Diagnosis not present

## 2019-11-24 ENCOUNTER — Other Ambulatory Visit: Payer: Self-pay

## 2019-11-24 ENCOUNTER — Other Ambulatory Visit: Payer: Medicare Other

## 2019-11-24 DIAGNOSIS — Z79899 Other long term (current) drug therapy: Secondary | ICD-10-CM | POA: Diagnosis not present

## 2019-12-13 DIAGNOSIS — L851 Acquired keratosis [keratoderma] palmaris et plantaris: Secondary | ICD-10-CM | POA: Diagnosis not present

## 2019-12-13 DIAGNOSIS — B351 Tinea unguium: Secondary | ICD-10-CM | POA: Diagnosis not present

## 2019-12-13 DIAGNOSIS — E1142 Type 2 diabetes mellitus with diabetic polyneuropathy: Secondary | ICD-10-CM | POA: Diagnosis not present

## 2019-12-22 ENCOUNTER — Telehealth: Payer: Self-pay | Admitting: Pharmacist

## 2019-12-22 NOTE — Telephone Encounter (Signed)
Resubmitted paperwork for patient's CGM Elenor Legato) through [facility]  Fax number: [phone]

## 2019-12-23 ENCOUNTER — Ambulatory Visit: Payer: Medicare Other | Admitting: Family Medicine

## 2019-12-26 ENCOUNTER — Ambulatory Visit (INDEPENDENT_AMBULATORY_CARE_PROVIDER_SITE_OTHER): Payer: Medicare Other | Admitting: Licensed Clinical Social Worker

## 2019-12-26 DIAGNOSIS — E1169 Type 2 diabetes mellitus with other specified complication: Secondary | ICD-10-CM | POA: Diagnosis not present

## 2019-12-26 DIAGNOSIS — N1832 Chronic kidney disease, stage 3b: Secondary | ICD-10-CM | POA: Diagnosis not present

## 2019-12-26 DIAGNOSIS — E1159 Type 2 diabetes mellitus with other circulatory complications: Secondary | ICD-10-CM

## 2019-12-26 DIAGNOSIS — I10 Essential (primary) hypertension: Secondary | ICD-10-CM | POA: Diagnosis not present

## 2019-12-26 DIAGNOSIS — F3341 Major depressive disorder, recurrent, in partial remission: Secondary | ICD-10-CM | POA: Diagnosis not present

## 2019-12-26 DIAGNOSIS — E781 Pure hyperglyceridemia: Secondary | ICD-10-CM | POA: Diagnosis not present

## 2019-12-26 DIAGNOSIS — K219 Gastro-esophageal reflux disease without esophagitis: Secondary | ICD-10-CM

## 2019-12-26 DIAGNOSIS — Z23 Encounter for immunization: Secondary | ICD-10-CM | POA: Diagnosis not present

## 2019-12-26 DIAGNOSIS — E1122 Type 2 diabetes mellitus with diabetic chronic kidney disease: Secondary | ICD-10-CM | POA: Diagnosis not present

## 2019-12-26 DIAGNOSIS — N183 Chronic kidney disease, stage 3 unspecified: Secondary | ICD-10-CM | POA: Diagnosis not present

## 2019-12-26 DIAGNOSIS — I152 Hypertension secondary to endocrine disorders: Secondary | ICD-10-CM

## 2019-12-26 NOTE — Patient Instructions (Addendum)
Licensed Clinical Social Worker Visit Information  Goals we discussed today:   .  Client will talk with LCSW about feelings of anger at being at [name] (pt-stated)        Current Barriers:   Mental Health Concerns in patient with Chronic Diagnoses of CKD, Type 2 DM, HTN, GERD, and Depression  Social Isolation   Self Harm History  Clinical Social Work Clinical Goal(s):   Over the next 30 days, client will work with LCSW to address concerns related to client's anger at being placed at [facility]  Interventions:  Talked with Kirtan about his support from [facility] pharmacist, Dr. Lottie Dawson  Talked with client about strategies for managing anger outbursts of client  Talked with client about social support network   Talked with client about recreational activities of choice (watching birds, listening to music, watching favorite TV shows)  Talked with client about pain issues in his knee (he said he is resting more in bed )  Talked with Vitaliy about ambulation (he said his knee hurts but he moves slowly to walk and can walk short distances)  Talked with Dashon about his support from [facility], Melissa Price  Talked with Kasandra Knudsen about vision of client  Talked with Jaylenn about recreational outings in [place] (said he went recently on an outing to see horses/different animals)  Patient Self Care Activities:   Attends all scheduled provider appointments  Attends church or other social activities  Plan:   Patient will call LCSW , as needed, to discuss client feelings of anger and managing feelings of anger   Patient will use strategies discussed to help him manage anger outbursts  Patient will attend appointments as scheduled with psychiatrist.  Patient will communicate needs to care provider staff at [facility]  LCSW to call client in next 4 weeks to discuss client management of anger issues regarding current placement  Initial goal documentation    Follow Up Plan: LCSW to call client in next 4 weeks to discuss client management of anger issues regarding current placement  Materials Provided: No  The patient verbalized understanding of instructions provided today and declined a print copy of patient instruction materials.   [name].Yulianna Folse MSW, LCSW Licensed Clinical Social Worker [facility] [phone]

## 2019-12-26 NOTE — Chronic Care Management (AMB) (Signed)
Chronic Care Management    Clinical Social Work Follow Up Note  12/26/2019 Name: David Zamora MRN: [id number] DOB: May 27, 1960  David Zamora is a 59 y.o. year old male who is a primary care patient of Dettinger, Fransisca Kaufmann, MD. The CCM team was consulted for assistance with [employer] .   Review of patient status, including review of consultants reports, other relevant assessments, and collaboration with appropriate care team members and the patient's provider was performed as part of comprehensive patient evaluation and provision of chronic care management services.    SDOH (Social Determinants of Health) assessments performed: No; risk for tobacco use;; risk for depression; risk for stress; risk for physical inactivity; risk for transport needs    Office Visit from 09/22/2019 in [facility]  PHQ-9 Total Score 6     GAD 7 : Generalized Anxiety Score 09/22/2019  Nervous, Anxious, on Edge 1  Control/stop worrying 0  Worry too much - different things 0  Trouble relaxing 0  Restless 0  Easily annoyed or irritable 1  Afraid - awful might happen 1  Total GAD 7 Score 3  Anxiety Difficulty Somewhat difficult    Outpatient Encounter Medications as of 12/26/2019  Medication Sig  . blood glucose meter kit and supplies KIT Dispense based on patient and insurance preference. Use up to four times daily as directed. (FOR ICD-9 250.00, 250.01).  [name] Kitchen buPROPion (WELLBUTRIN XL) 150 MG 24 hr tablet Take 150 mg by mouth every morning.   . busPIRone (BUSPAR) 10 MG tablet Take 10 mg by mouth 2 (two) times daily.  . Carboxymeth-Glycerin-Polysorb (REFRESH OPTIVE ADVANCED) 0.5-1-0.5 % SOLN Apply 1 drop to eye 4 (four) times daily.  . Cholecalciferol (VITAMIN D3) 50000 units TABS Take 50,000 Units by mouth every Saturday.   . Continuous Blood Gluc Receiver (FREESTYLE LIBRE 14 DAY READER) DEVI 1 each by Does not apply route 4 (four) times daily.  . Continuous Blood Gluc Sensor  (FREESTYLE LIBRE 14 DAY SENSOR) MISC 1 each by Does not apply route every 14 (fourteen) days.  . divalproex (DEPAKOTE ER) 250 MG 24 hr tablet Take 250-1,000 mg by mouth See admin instructions. 270m in the morning and 10082m at bedtime  . Emollient (CERAVE) CREA APPLY 1 APPLICATION ONCE A DAY TO BOTH FEET  . fluticasone (FLONASE) 50 MCG/ACT nasal spray 1 SPRAY EACH NOSTRIL TWICE DAILY AS NEEDED FOR ALLERGIES OR RHINITIS.  . [name] KitchenORA Lancets MISC USE TO TEST BLOOD SUGAR 3 TIMES A DAY USE TO TEST BLOOD SUGAR AS NEEDED FOR SIGNS/SYMPTOMS OF HYPOGLYCEMIA (SHAKINESS, SWEATING, BLURRED VIS  . furosemide (LASIX) 20 MG tablet Take 20 mg by mouth daily as needed (for weight gain > 3 lbs).  . [name] Kitchenlucose blood (FORA V30A BLOOD GLUCOSE TEST) test strip USE TO TEST BLOOD SUGAR 3 TIMES A DAY AS NEEDED FOR SIGNS/ SYMPTOMS OF HYPOGLYCEMIA SHAKINESS, SWEATING, BLURRED VISION. Dx E11.9  . ibuprofen (MOTRIN IB) 200 MG tablet Take 2 tablets (400 mg total) by mouth daily.  . insulin aspart (NOVOLOG FLEXPEN) 100 UNIT/ML FlexPen Inject 8 Units into the skin 3 (three) times daily as needed for high blood sugar (Give for high sugars with meals, if <100 then hold).  . Insulin Pen Needle (TRUEPLUS PEN NEEDLES) 31G X 8 MM MISC USE TO INJECT XULTOPHY DAILY  . lisinopril (ZESTRIL) 10 MG tablet TAKE (1) TABLET BY MOUTH ONCE DAILY FOR PROTEINURIA  . metFORMIN (GLUCOPHAGE) 500 MG tablet Take 1 tablet (500 mg total) by  mouth 2 (two) times daily with a meal.  . omeprazole (PRILOSEC) 20 MG capsule TAKE 1 CAPSULE BY MOUTH ONCE DAILY.  [name] Kitchen PARoxetine (PAXIL) 20 MG tablet Take 20 mg by mouth every morning.  [name] Kitchen Phenylephrine-DM-GG (TUSSIN CF PO) Take 5 mLs by mouth 4 (four) times daily as needed.  [name] Kitchen QUEtiapine (SEROQUEL XR) 300 MG 24 hr tablet Take 300 mg by mouth daily. Taken daily at 4pm  . simvastatin (ZOCOR) 40 MG tablet TAKE (1) TABLET BY MOUTH ONCE DAILY.  . tamsulosin (FLOMAX) 0.4 MG CAPS capsule TAKE 1 CAPSULE BY MOUTH ONCE A DAY.  [name] Kitchen  tolnaftate (TINACTIN) 1 % spray Apply topically 2 (two) times daily.  . Transparent Dressings (TEGADERM FILM 6"X8") MISC Use to cover free style libre sensor  . triamcinolone ointment (KENALOG) 0.5 % APPLY TOPICALLY TWICE DAILY.  [name] Kitchen VASCEPA 1 g capsule TAKE (2) CAPSULES BY MOUTH TWICE DAILY.  . XULTOPHY 100-3.6 UNIT-MG/ML SOPN INJECT 35 UNITS INTO SKIN EVERY MORNING.   No facility-administered encounter medications on file as of 12/26/2019.    Goals    .  Client will talk with LCSW about feelings of anger at being at [name] (pt-stated)      Current Barriers:  [name] Kitchen Mental Health Concerns in patient with Chronic Diagnoses of CKD, Type 2 DM, HTN, GERD, and Depression . Social Isolation  . Self Harm History  Clinical Social Work Clinical Goal(s):  [name] Kitchen Over the next 30 days, client will work with LCSW to address concerns related to client's anger at being placed at [facility]  Interventions:  Talked with Calven about his support from [facility] pharmacist, Dr. Lottie Dawson  Talked with client about strategies for managing anger outbursts of client  Talked with client about social support network   Talked with client about recreational activities of choice (watching birds, listening to music, watching favorite TV shows)  Talked with client about pain issues in his knee (he said he is resting more in bed )  Talked with Jerimah about ambulation (he said his knee hurts but he moves slowly to walk and can walk short distances)  Talked with Damarco about his support from [place], [name]  Talked with Kasandra Knudsen about vision of client  Talked with Griffin about recreational outings in [place] (said he went recently on an outing to see horses/different animals)  Patient Self Care Activities:  . Attends all scheduled provider appointments . Attends church or other social activities  Plan:  . Patient will call LCSW , as needed, to discuss client feelings of anger and managing feelings of  anger  . Patient will use strategies discussed to help him manage anger outbursts . Patient will attend appointments as scheduled with psychiatrist. . Patient will communicate needs to care provider staff at [facility] . LCSW to call client in next 4 weeks to discuss client management of anger issues regarding current placement  Initial goal documentation   Follow Up Plan: LCSW to call client in next 4 weeks to discuss client management of anger issues regarding current placement  [name].Dearius Hoffmann MSW, LCSW Licensed Clinical Social Worker [facility] [phone]

## 2019-12-27 ENCOUNTER — Encounter: Payer: Self-pay | Admitting: Family Medicine

## 2019-12-27 ENCOUNTER — Other Ambulatory Visit: Payer: Self-pay

## 2019-12-27 ENCOUNTER — Ambulatory Visit (INDEPENDENT_AMBULATORY_CARE_PROVIDER_SITE_OTHER): Payer: Medicare Other | Admitting: Family Medicine

## 2019-12-27 VITALS — BP 131/88 | HR 96 | Temp 98.0°F | Ht 65.0 in | Wt 218.0 lb

## 2019-12-27 DIAGNOSIS — I152 Hypertension secondary to endocrine disorders: Secondary | ICD-10-CM

## 2019-12-27 DIAGNOSIS — N1832 Chronic kidney disease, stage 3b: Secondary | ICD-10-CM | POA: Diagnosis not present

## 2019-12-27 DIAGNOSIS — N183 Chronic kidney disease, stage 3 unspecified: Secondary | ICD-10-CM | POA: Diagnosis not present

## 2019-12-27 DIAGNOSIS — E781 Pure hyperglyceridemia: Secondary | ICD-10-CM

## 2019-12-27 DIAGNOSIS — E1122 Type 2 diabetes mellitus with diabetic chronic kidney disease: Secondary | ICD-10-CM

## 2019-12-27 DIAGNOSIS — E1169 Type 2 diabetes mellitus with other specified complication: Secondary | ICD-10-CM

## 2019-12-27 DIAGNOSIS — Z23 Encounter for immunization: Secondary | ICD-10-CM | POA: Diagnosis not present

## 2019-12-27 DIAGNOSIS — I10 Essential (primary) hypertension: Secondary | ICD-10-CM | POA: Diagnosis not present

## 2019-12-27 DIAGNOSIS — E1159 Type 2 diabetes mellitus with other circulatory complications: Secondary | ICD-10-CM | POA: Diagnosis not present

## 2019-12-27 LAB — BAYER DCA HB A1C WAIVED: HB A1C (BAYER DCA - WAIVED): 7.5 % — ABNORMAL HIGH (ref <7.0)

## 2019-12-27 MED ORDER — DAPAGLIFLOZIN PROPANEDIOL 5 MG PO TABS: 5.0000 mg | ORAL_TABLET | Freq: Every day | ORAL | 3 refills | Status: DC

## 2019-12-27 NOTE — Progress Notes (Addendum)
BP 131/88   Pulse 96   Temp 98 F (36.7 C)   Ht '5\' 5"'  (1.651 m)   Wt 218 lb (98.9 kg)   SpO2 96%   BMI 36.28 kg/m    Subjective:   Patient ID: David Zamora, male    DOB: 02-17-1961, 59 y.o.   MRN: [id number]  HPI: David Zamora is a 59 y.o. male presenting on 12/27/2019 for Medical Management of Chronic Issues and Diabetes   HPI Type 2 diabetes mellitus Patient comes in today for recheck of his diabetes. Patient has been currently taking Xultophy and NovoLog and Metformin and A1c is 7.5 today, he is sneaking food and eating things that he should not per his caretaker.. Patient is currently on an ACE inhibitor/ARB. Patient has not seen an ophthalmologist this year. Patient denies any issues with their feet. The symptom started onset as an adult CKD and hypertension and hyperlipidemia ARE RELATED TO DM   Hypertension Patient is currently on lisinopril, and their blood pressure today is 131/88. Patient denies any lightheadedness or dizziness. Patient denies headaches, blurred vision, chest pains, shortness of breath, or weakness. Denies any side effects from medication and is content with current medication.   Relevant past medical, surgical, family and social history reviewed and updated as indicated. Interim medical history since our last visit reviewed. Allergies and medications reviewed and updated.  Review of Systems  Constitutional: Positive for fatigue. Negative for chills and fever.  Eyes: Negative for visual disturbance.  Respiratory: Negative for shortness of breath and wheezing.   Cardiovascular: Negative for chest pain and leg swelling.  Musculoskeletal: Negative for back pain and gait problem.  Skin: Negative for rash.  Neurological: Negative for dizziness, weakness and numbness.  All other systems reviewed and are negative.   Per HPI unless specifically indicated above   Allergies as of 12/27/2019      Reactions   Soap    White soaps      Medication List        Accurate as of December 27, 2019 10:08 AM. If you have any questions, ask your nurse or doctor.        blood glucose meter kit and supplies Kit Dispense based on patient and insurance preference. Use up to four times daily as directed. (FOR ICD-9 250.00, 250.01).   buPROPion 150 MG 24 hr tablet Commonly known as: WELLBUTRIN XL Take 150 mg by mouth every morning.   busPIRone 10 MG tablet Commonly known as: BUSPAR Take 10 mg by mouth 2 (two) times daily.   CeraVe Crea APPLY 1 APPLICATION ONCE A DAY TO BOTH FEET   divalproex 250 MG 24 hr tablet Commonly known as: DEPAKOTE ER Take 250-1,000 mg by mouth See admin instructions. 264m in the morning and 10031m at bedtime   fluticasone 50 MCG/ACT nasal spray Commonly known as: FLONASE 1 SPRAY EACH NOSTRIL TWICE DAILY AS NEEDED FOR ALLERGIES OR RHINITIS.   FORA Lancets Misc USE TO TEST BLOOD SUGAR 3 TIMES A DAY USE TO TEST BLOOD SUGAR AS NEEDED FOR SIGNS/SYMPTOMS OF HYPOGLYCEMIA (SHAKINESS, SWEATING, BLURRED VIS   FORA V30a Blood Glucose Test test strip Generic drug: glucose blood USE TO TEST BLOOD SUGAR 3 TIMES A DAY AS NEEDED FOR SIGNS/ SYMPTOMS OF HYPOGLYCEMIA SHAKINESS, SWEATING, BLURRED VISION. Dx E11.9   FreeStyle Libre 14 Day Reader DeKerrin Mo each by Does not apply route 4 (four) times daily.   FreeStyle Libre 14 Day Sensor Misc 1 each by Does  not apply route every 14 (fourteen) days.   furosemide 20 MG tablet Commonly known as: LASIX Take 20 mg by mouth daily as needed (for weight gain > 3 lbs).   ibuprofen 200 MG tablet Commonly known as: Motrin IB Take 2 tablets (400 mg total) by mouth daily.   lisinopril 10 MG tablet Commonly known as: ZESTRIL TAKE (1) TABLET BY MOUTH ONCE DAILY FOR PROTEINURIA   metFORMIN 500 MG tablet Commonly known as: GLUCOPHAGE Take 1 tablet (500 mg total) by mouth 2 (two) times daily with a meal.   NovoLOG FlexPen 100 UNIT/ML FlexPen Generic drug: insulin aspart Inject 8 Units  into the skin 3 (three) times daily as needed for high blood sugar (Give for high sugars with meals, if <100 then hold).   omeprazole 20 MG capsule Commonly known as: PRILOSEC TAKE 1 CAPSULE BY MOUTH ONCE DAILY.   PARoxetine 20 MG tablet Commonly known as: PAXIL Take 20 mg by mouth every morning.   QUEtiapine 300 MG 24 hr tablet Commonly known as: SEROQUEL XR Take 300 mg by mouth daily. Taken daily at 4pm   Refresh Optive Advanced 0.5-1-0.5 % Soln Generic drug: Carboxymeth-Glycerin-Polysorb Apply 1 drop to eye 4 (four) times daily.   simvastatin 40 MG tablet Commonly known as: ZOCOR TAKE (1) TABLET BY MOUTH ONCE DAILY.   tamsulosin 0.4 MG Caps capsule Commonly known as: FLOMAX TAKE 1 CAPSULE BY MOUTH ONCE A DAY.   Tegaderm Film 6"x8" Misc Use to cover free style libre sensor   tolnaftate 1 % spray Commonly known as: TINACTIN Apply topically 2 (two) times daily.   triamcinolone ointment 0.5 % Commonly known as: KENALOG APPLY TOPICALLY TWICE DAILY.   TRUEplus Pen Needles 31G X 8 MM Misc Generic drug: Insulin Pen Needle USE TO INJECT XULTOPHY DAILY   TUSSIN CF PO Take 5 mLs by mouth 4 (four) times daily as needed.   Vascepa 1 g capsule Generic drug: icosapent Ethyl TAKE (2) CAPSULES BY MOUTH TWICE DAILY.   Vitamin D3 1.25 MG (50000 UT) Tabs Take 50,000 Units by mouth every Saturday.   Xultophy 100-3.6 UNIT-MG/ML Sopn Generic drug: Insulin Degludec-Liraglutide INJECT 35 UNITS INTO SKIN EVERY MORNING.        Objective:   BP 131/88   Pulse 96   Temp 98 F (36.7 C)   Ht '5\' 5"'  (1.651 m)   Wt 218 lb (98.9 kg)   SpO2 96%   BMI 36.28 kg/m   Wt Readings from Last 3 Encounters:  12/27/19 218 lb (98.9 kg)  09/22/19 218 lb 8 oz (99.1 kg)  05/13/19 217 lb (98.4 kg)    Physical Exam Vitals and nursing note reviewed.  Constitutional:      General: He is not in acute distress.    Appearance: He is well-developed. He is not diaphoretic.  Eyes:     General:  No scleral icterus.    Conjunctiva/sclera: Conjunctivae normal.  Neck:     Thyroid: No thyromegaly.  Cardiovascular:     Rate and Rhythm: Normal rate and regular rhythm.     Heart sounds: Normal heart sounds. No murmur heard.   Pulmonary:     Effort: Pulmonary effort is normal. No respiratory distress.     Breath sounds: Normal breath sounds. No wheezing.  Musculoskeletal:        General: Normal range of motion.     Cervical back: Neck supple.  Lymphadenopathy:     Cervical: No cervical adenopathy.  Skin:    General:  Skin is warm and dry.     Findings: No rash.  Neurological:     Mental Status: He is alert and oriented to person, place, and time.     Coordination: Coordination normal.  Psychiatric:        Behavior: Behavior normal.     Results for orders placed or performed in visit on 12/27/19  Bayer DCA Hb A1c Waived  Result Value Ref Range   HB A1C (BAYER DCA - WAIVED) 7.5 (H) <7.0 %    Assessment & Plan:   Problem List Items Addressed This Visit      Cardiovascular and Mediastinum   Hypertension associated with diabetes ([place])   Relevant Medications   dapagliflozin propanediol (FARXIGA) 5 MG TABS tablet     Endocrine   Controlled diabetes mellitus ([place]) - Primary   Relevant Medications   dapagliflozin propanediol (FARXIGA) 5 MG TABS tablet   Other Relevant Orders   Bayer DCA Hb A1c Waived (Completed)   TSH   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)   Relevant Medications   dapagliflozin propanediol (FARXIGA) 5 MG TABS tablet   Other Relevant Orders   TSH     Genitourinary   Chronic kidney disease (CKD), stage III (moderate)    Other Visit Diagnoses    Flu vaccine need       Relevant Orders   Flu Vaccine QUAD 6+ mos PF IM (Fluarix Quad PF)      Will give sample for farxiga for patient to try, see if blood sugars can come down, we will have to recheck kidney function but if still having issues then we may have to back off on the  Metformin again but for now continue.  Patient continues to sneak food and that is one of the biggest issues that he fights with diabetes.  This office visit was a visit to discuss patient's diabetic management and because she is out of control and using insulin 4 times daily and having to check her blood sugars 4 times daily I believe she would be a good candidate for a continuous subcutaneous glucose monitor such as freestyle libre.  Follow up plan: Return in about 3 months (around 03/27/2020), or if symptoms worsen or fail to improve, for Diabetes recheck, bring meter with him the next time..  Counseling provided for all of the vaccine components Orders Placed This Encounter  Procedures  . Flu Vaccine QUAD 6+ mos PF IM (Fluarix Quad PF)  . Bayer DCA Hb A1c Waived  . TSH    Caryl Pina, MD [facility] 12/27/2019, 10:08 AM

## 2019-12-28 LAB — TSH: TSH: 2.85 u[IU]/mL (ref 0.450–4.500)

## 2020-01-19 ENCOUNTER — Other Ambulatory Visit: Payer: Self-pay | Admitting: Family Medicine

## 2020-01-19 DIAGNOSIS — E1169 Type 2 diabetes mellitus with other specified complication: Secondary | ICD-10-CM

## 2020-01-19 DIAGNOSIS — E781 Pure hyperglyceridemia: Secondary | ICD-10-CM

## 2020-01-27 ENCOUNTER — Telehealth: Payer: Medicare Other

## 2020-02-07 DIAGNOSIS — F319 Bipolar disorder, unspecified: Secondary | ICD-10-CM | POA: Diagnosis not present

## 2020-02-21 ENCOUNTER — Other Ambulatory Visit: Payer: Self-pay

## 2020-02-21 ENCOUNTER — Other Ambulatory Visit: Payer: Medicare Other

## 2020-02-21 DIAGNOSIS — E1129 Type 2 diabetes mellitus with other diabetic kidney complication: Secondary | ICD-10-CM | POA: Diagnosis not present

## 2020-02-21 DIAGNOSIS — E1122 Type 2 diabetes mellitus with diabetic chronic kidney disease: Secondary | ICD-10-CM | POA: Diagnosis not present

## 2020-02-21 DIAGNOSIS — E559 Vitamin D deficiency, unspecified: Secondary | ICD-10-CM | POA: Diagnosis not present

## 2020-02-21 DIAGNOSIS — E1142 Type 2 diabetes mellitus with diabetic polyneuropathy: Secondary | ICD-10-CM | POA: Diagnosis not present

## 2020-02-21 DIAGNOSIS — D631 Anemia in chronic kidney disease: Secondary | ICD-10-CM | POA: Diagnosis not present

## 2020-02-21 DIAGNOSIS — N189 Chronic kidney disease, unspecified: Secondary | ICD-10-CM | POA: Diagnosis not present

## 2020-02-21 DIAGNOSIS — R809 Proteinuria, unspecified: Secondary | ICD-10-CM | POA: Diagnosis not present

## 2020-02-21 DIAGNOSIS — L851 Acquired keratosis [keratoderma] palmaris et plantaris: Secondary | ICD-10-CM | POA: Diagnosis not present

## 2020-02-21 DIAGNOSIS — Z79899 Other long term (current) drug therapy: Secondary | ICD-10-CM | POA: Diagnosis not present

## 2020-02-21 DIAGNOSIS — B351 Tinea unguium: Secondary | ICD-10-CM | POA: Diagnosis not present

## 2020-02-21 DIAGNOSIS — E875 Hyperkalemia: Secondary | ICD-10-CM | POA: Diagnosis not present

## 2020-02-22 ENCOUNTER — Other Ambulatory Visit: Payer: Self-pay | Admitting: Family Medicine

## 2020-03-05 ENCOUNTER — Telehealth: Payer: Medicare Other

## 2020-03-26 ENCOUNTER — Ambulatory Visit (INDEPENDENT_AMBULATORY_CARE_PROVIDER_SITE_OTHER): Payer: Medicare Other | Admitting: Family Medicine

## 2020-03-26 ENCOUNTER — Encounter: Payer: Self-pay | Admitting: Family Medicine

## 2020-03-26 ENCOUNTER — Telehealth: Payer: Self-pay

## 2020-03-26 ENCOUNTER — Other Ambulatory Visit: Payer: Self-pay

## 2020-03-26 VITALS — BP 124/77 | HR 96 | Temp 97.0°F | Ht 65.0 in | Wt 217.0 lb

## 2020-03-26 DIAGNOSIS — E1159 Type 2 diabetes mellitus with other circulatory complications: Secondary | ICD-10-CM | POA: Diagnosis not present

## 2020-03-26 DIAGNOSIS — E1169 Type 2 diabetes mellitus with other specified complication: Secondary | ICD-10-CM | POA: Diagnosis not present

## 2020-03-26 DIAGNOSIS — E781 Pure hyperglyceridemia: Secondary | ICD-10-CM | POA: Diagnosis not present

## 2020-03-26 DIAGNOSIS — N183 Chronic kidney disease, stage 3 unspecified: Secondary | ICD-10-CM | POA: Diagnosis not present

## 2020-03-26 DIAGNOSIS — E1122 Type 2 diabetes mellitus with diabetic chronic kidney disease: Secondary | ICD-10-CM | POA: Diagnosis not present

## 2020-03-26 DIAGNOSIS — I152 Hypertension secondary to endocrine disorders: Secondary | ICD-10-CM | POA: Diagnosis not present

## 2020-03-26 DIAGNOSIS — Z1211 Encounter for screening for malignant neoplasm of colon: Secondary | ICD-10-CM | POA: Diagnosis not present

## 2020-03-26 DIAGNOSIS — N1832 Chronic kidney disease, stage 3b: Secondary | ICD-10-CM

## 2020-03-26 LAB — BAYER DCA HB A1C WAIVED: HB A1C (BAYER DCA - WAIVED): 7.6 % — ABNORMAL HIGH (ref <7.0)

## 2020-03-26 MED ORDER — OZEMPIC (1 MG/DOSE) 4 MG/3ML ~~LOC~~ SOPN: 4.0000 mg | PEN_INJECTOR | SUBCUTANEOUS | 3 refills | Status: DC

## 2020-03-26 MED ORDER — OZEMPIC (0.25 OR 0.5 MG/DOSE) 2 MG/1.5ML ~~LOC~~ SOPN: 0.5000 mg | PEN_INJECTOR | Freq: Every day | SUBCUTANEOUS | 3 refills | Status: DC

## 2020-03-26 MED ORDER — METFORMIN HCL 500 MG PO TABS
500.0000 mg | ORAL_TABLET | Freq: Two times a day (BID) | ORAL | 3 refills | Status: DC
Start: 2020-03-26 — End: 2020-04-17

## 2020-03-26 MED ORDER — FLUTICASONE PROPIONATE 50 MCG/ACT NA SUSP
NASAL | 99 refills | Status: DC
Start: 2020-03-26 — End: 2021-04-19

## 2020-03-26 MED ORDER — LISINOPRIL 10 MG PO TABS
ORAL_TABLET | ORAL | 3 refills | Status: DC
Start: 2020-03-26 — End: 2021-04-19

## 2020-03-26 MED ORDER — NOVOLOG FLEXPEN 100 UNIT/ML ~~LOC~~ SOPN: PEN_INJECTOR | SUBCUTANEOUS | 0 refills | Status: DC

## 2020-03-26 MED ORDER — OMEPRAZOLE 20 MG PO CPDR: 20.0000 mg | DELAYED_RELEASE_CAPSULE | Freq: Every day | ORAL | 3 refills | Status: DC

## 2020-03-26 MED ORDER — FUROSEMIDE 20 MG PO TABS
ORAL_TABLET | ORAL | 99 refills | Status: DC
Start: 2020-03-26 — End: 2023-03-09

## 2020-03-26 MED ORDER — SIMVASTATIN 40 MG PO TABS: ORAL_TABLET | ORAL | 30 refills | Status: DC

## 2020-03-26 MED ORDER — INSULIN DEGLUDEC 100 UNIT/ML ~~LOC~~ SOPN: 30.0000 [IU] | PEN_INJECTOR | Freq: Every day | SUBCUTANEOUS | 3 refills | Status: DC

## 2020-03-26 NOTE — Patient Instructions (Signed)
Take 30 units of Tresiba from the samples and then continue with 30 units of Tresiba.  Start with samples of Trulicity 1.5 mg weekly When you get prescription for Ozempic 4 mg go ahead and transition onto that for 4 mg of Ozempic weekly.

## 2020-03-26 NOTE — Addendum Note (Signed)
Addended by: Caryl Pina on: 03/26/2020 02:41 PM   Modules accepted: Orders

## 2020-03-26 NOTE — Telephone Encounter (Signed)
I sent the omeprazole is once daily 20 mg, is he on a different dose or what is wrong with the prescription.  Please call and then let me know.

## 2020-03-26 NOTE — Telephone Encounter (Signed)
Pharmacy states it was for a different medication but it already has been corrected.

## 2020-03-26 NOTE — Progress Notes (Signed)
BP 124/77   Pulse 96   Temp (!) 97 F (36.1 C)   Ht _0  (1.651 m)   Wt 217 lb (98.4 kg)   SpO2 97%   BMI 36.11 kg/m    Subjective:   Patient ID: David Zamora, male    DOB: 1961-04-06, 59 y.o.   MRN: [id number]  HPI: David Zamora is a 59 y.o. male presenting on 03/26/2020 for Medical Management of Chronic Issues and Diabetes   HPI Type 2 diabetes mellitus Patient comes in today for recheck of his diabetes. Patient has been currently taking Metformin and [place] and Xultophy. Patient is currently on an ACE inhibitor/ARB. Patient has seen an ophthalmologist this year. Patient denies any issues with their feet. The symptom started onset as an adult hypertension hyperlipidemia and CKD stage III ARE RELATED TO DM   Hypertension Patient is currently on furosemide, and their blood pressure today is 124/77. Patient denies any lightheadedness or dizziness. Patient denies headaches, blurred vision, chest pains, shortness of breath, or weakness. Denies any side effects from medication and is content with current medication.   Hyperlipidemia Patient is coming in for recheck of his hyperlipidemia. The patient is currently taking Vascepa simvastatin. They deny any issues with myalgias or history of liver damage from it. They deny any focal numbness or weakness or chest pain.   Relevant past medical, surgical, family and social history reviewed and updated as indicated. Interim medical history since our last visit reviewed. Allergies and medications reviewed and updated.  Review of Systems  Constitutional: Negative for chills and fever.  Respiratory: Negative for shortness of breath and wheezing.   Cardiovascular: Negative for chest pain and leg swelling.  Musculoskeletal: Negative for back pain and gait problem.  Skin: Negative for rash.  Neurological: Negative for dizziness, weakness and light-headedness.  All other systems reviewed and are negative.   Per HPI unless specifically  indicated above   Allergies as of 03/26/2020      Reactions   Soap    White soaps      Medication List       Accurate as of March 26, 2020  8:45 AM. If you have any questions, ask your nurse or doctor.        blood glucose meter kit and supplies Kit Dispense based on patient and insurance preference. Use up to four times daily as directed. (FOR ICD-9 250.00, 250.01).   buPROPion 150 MG 24 hr tablet Commonly known as: WELLBUTRIN XL Take 150 mg by mouth every morning.   busPIRone 10 MG tablet Commonly known as: BUSPAR Take 10 mg by mouth 2 (two) times daily.   Carboxymeth-Glycerin-Polysorb 0.5-1-0.5 % Soln Apply 1 drop to eye 4 (four) times daily.   CeraVe Crea APPLY 1 APPLICATION ONCE A DAY TO BOTH FEET   dapagliflozin propanediol 5 MG Tabs tablet Commonly known as: FARXIGA Take 1 tablet (5 mg total) by mouth daily before breakfast.   divalproex 250 MG 24 hr tablet Commonly known as: DEPAKOTE ER Take 250-1,000 mg by mouth See admin instructions. 256m in the morning and 10051m at bedtime   fluticasone 50 MCG/ACT nasal spray Commonly known as: FLONASE 1 SPRAY EACH NOSTRIL TWICE DAILY AS NEEDED FOR ALLERGIES OR RHINITIS.   FORA Lancets Misc USE TO TEST BLOOD SUGAR 3 TIMES A DAY USE TO TEST BLOOD SUGAR AS NEEDED FOR SIGNS/SYMPTOMS OF HYPOGLYCEMIA (SHAKINESS, SWEATING, BLURRED VIS   FORA V30a Blood Glucose Test test strip Generic drug: glucose  blood USE TO TEST BLOOD SUGAR 3 TIMES A DAY AS NEEDED FOR SIGNS/ SYMPTOMS OF HYPOGLYCEMIA SHAKINESS, SWEATING, BLURRED VISION. Dx E11.9   FreeStyle Libre 14 Day Reader Kerrin Mo 1 each by Does not apply route 4 (four) times daily.   FreeStyle Libre 14 Day Sensor Misc 1 each by Does not apply route every 14 (fourteen) days.   furosemide 20 MG tablet Commonly known as: LASIX TAKE 1 TABLET BY MOUTH ONCE DAILY AS NEEDED FOR WEIGHT GAIN GREATER THAN 3 LBS   ibuprofen 200 MG tablet Commonly known as: Motrin IB Take 2 tablets  (400 mg total) by mouth daily.   lisinopril 10 MG tablet Commonly known as: ZESTRIL TAKE (1) TABLET BY MOUTH ONCE DAILY FOR PROTEINURIA   metFORMIN 500 MG tablet Commonly known as: GLUCOPHAGE Take 1 tablet (500 mg total) by mouth 2 (two) times daily with a meal.   NovoLOG FlexPen 100 UNIT/ML FlexPen Generic drug: insulin aspart INJECT 8 UNITS INTO THE SIN 3 TIMES DAILY AS NEEDED FOR HIGH BLOOD SUGAR. (GIVE FOR HIGH SUGARS WITH MEALS, IF<100 THEN HOLD).   omeprazole 20 MG capsule Commonly known as: PRILOSEC TAKE 1 CAPSULE BY MOUTH ONCE DAILY.   PARoxetine 20 MG tablet Commonly known as: PAXIL Take 20 mg by mouth every morning.   QUEtiapine 300 MG 24 hr tablet Commonly known as: SEROQUEL XR Take 300 mg by mouth daily. Taken daily at 4pm   simvastatin 40 MG tablet Commonly known as: ZOCOR TAKE (1) TABLET BY MOUTH ONCE DAILY.   tamsulosin 0.4 MG Caps capsule Commonly known as: FLOMAX TAKE 1 CAPSULE BY MOUTH ONCE A DAY.   Tegaderm Film 6"x8" Misc Use to cover free style libre sensor   tolnaftate 1 % spray Commonly known as: TINACTIN Apply topically 2 (two) times daily.   triamcinolone ointment 0.5 % Commonly known as: KENALOG APPLY TOPICALLY TWICE DAILY.   TRUEplus Zamora Needles 31G X 8 MM Misc Generic drug: Insulin Zamora Needle USE TO INJECT XULTOPHY DAILY   TUSSIN CF PO Take 5 mLs by mouth 4 (four) times daily as needed.   Vascepa 1 g capsule Generic drug: icosapent Ethyl TAKE (2) CAPSULES BY MOUTH TWICE DAILY.   Vitamin D3 1.25 MG (50000 UT) Tabs Take 50,000 Units by mouth every Saturday.   Xultophy 100-3.6 UNIT-MG/ML Sopn Generic drug: Insulin Degludec-Liraglutide INJECT 35 UNITS INTO SKIN EVERY MORNING.        Objective:   BP 124/77   Pulse 96   Temp (!) 97 F (36.1 C)   Ht _0  (1.651 m)   Wt 217 lb (98.4 kg)   SpO2 97%   BMI 36.11 kg/m   Wt Readings from Last 3 Encounters:  03/26/20 217 lb (98.4 kg)  12/27/19 218 lb (98.9 kg)  09/22/19  218 lb 8 oz (99.1 kg)    Physical Exam Vitals and nursing note reviewed.  Constitutional:      General: He is not in acute distress.    Appearance: He is well-developed and well-nourished. He is not diaphoretic.  Eyes:     General: No scleral icterus.    Extraocular Movements: EOM normal.     Conjunctiva/sclera: Conjunctivae normal.  Neck:     Thyroid: No thyromegaly.  Cardiovascular:     Rate and Rhythm: Normal rate and regular rhythm.     Pulses: Intact distal pulses.     Heart sounds: Normal heart sounds. No murmur heard.   Pulmonary:     Effort: Pulmonary effort is  normal. No respiratory distress.     Breath sounds: Normal breath sounds. No wheezing.  Musculoskeletal:        General: No edema. Normal range of motion.     Cervical back: Neck supple.  Lymphadenopathy:     Cervical: No cervical adenopathy.  Skin:    General: Skin is warm and dry.     Findings: No rash.  Neurological:     Mental Status: He is alert and oriented to person, place, and time.     Coordination: Coordination normal.  Psychiatric:        Mood and Affect: Mood and affect normal.        Behavior: Behavior normal.       Assessment & Plan:   Problem List Items Addressed This Visit      Cardiovascular and Mediastinum   Hypertension associated with diabetes (HCC)   Relevant Medications   furosemide (LASIX) 20 MG tablet   lisinopril (ZESTRIL) 10 MG tablet   metFORMIN (GLUCOPHAGE) 500 MG tablet   insulin aspart (NOVOLOG FLEXPEN) 100 UNIT/ML FlexPen   simvastatin (ZOCOR) 40 MG tablet   insulin degludec (TRESIBA) 100 UNIT/ML FlexTouch Zamora   Semaglutide, 1 MG/DOSE, (OZEMPIC, 1 MG/DOSE,) 4 MG/3ML SOPN   Other Relevant Orders   CMP14+EGFR   Lipid panel     Endocrine   Controlled diabetes mellitus (HCC)   Relevant Medications   lisinopril (ZESTRIL) 10 MG tablet   metFORMIN (GLUCOPHAGE) 500 MG tablet   insulin aspart (NOVOLOG FLEXPEN) 100 UNIT/ML FlexPen   simvastatin (ZOCOR) 40 MG tablet    insulin degludec (TRESIBA) 100 UNIT/ML FlexTouch Zamora   Semaglutide, 1 MG/DOSE, (OZEMPIC, 1 MG/DOSE,) 4 MG/3ML SOPN   Other Relevant Orders   CMP14+EGFR   Lipid panel   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) - Primary   Relevant Medications   furosemide (LASIX) 20 MG tablet   lisinopril (ZESTRIL) 10 MG tablet   metFORMIN (GLUCOPHAGE) 500 MG tablet   insulin aspart (NOVOLOG FLEXPEN) 100 UNIT/ML FlexPen   simvastatin (ZOCOR) 40 MG tablet   insulin degludec (TRESIBA) 100 UNIT/ML FlexTouch Zamora   Semaglutide, 1 MG/DOSE, (OZEMPIC, 1 MG/DOSE,) 4 MG/3ML SOPN   Other Relevant Orders   Bayer DCA Hb A1c Waived   CBC with Differential/Platelet   CMP14+EGFR   Lipid panel     Genitourinary   Chronic kidney disease (CKD), stage III (moderate) (HCC)   Relevant Orders   CMP14+EGFR   VITAMIN D 25 Hydroxy (Vit-D Deficiency, Fractures)    Other Visit Diagnoses    Colon cancer screening       Relevant Orders   Cologuard      Gave samples for Trulicity 1.5 mg to bridge and a sample of Tresiba, sent a prescription for Tresiba and Ozempic 4 mg.  A1c 7.6 elevated.  Patient brought CGM today and it shows that he is running low very frequently, almost every day getting down to the 50s and 60s and then running very high into the 300s especially in the evening with snacking. Follow up plan: Return in about 3 months (around 06/24/2020), or if symptoms worsen or fail to improve, for Diabetes recheck, also set up a 1 to 2-week check with [facility] clinical pharmacist.  Counseling provided for all of the vaccine components Orders Placed This Encounter  Procedures  . B[facility] Hb A1c Waived    Caryl Pina, MD [facility] 03/26/2020, 8:45 AM

## 2020-03-27 LAB — CBC WITH DIFFERENTIAL/PLATELET
Basophils Absolute: 0 10*3/uL (ref 0.0–0.2)
Basos: 0 %
EOS (ABSOLUTE): 0.3 10*3/uL (ref 0.0–0.4)
Eos: 3 %
Hematocrit: 37.2 % — ABNORMAL LOW (ref 37.5–51.0)
Hemoglobin: 12.8 g/dL — ABNORMAL LOW (ref 13.0–17.7)
Immature Grans (Abs): 0 10*3/uL (ref 0.0–0.1)
Immature Granulocytes: 1 %
Lymphocytes Absolute: 1.7 10*3/uL (ref 0.7–3.1)
Lymphs: 24 %
MCH: 32.7 pg (ref 26.6–33.0)
MCHC: 34.4 g/dL (ref 31.5–35.7)
MCV: 95 fL (ref 79–97)
Monocytes Absolute: 0.6 10*3/uL (ref 0.1–0.9)
Monocytes: 8 %
Neutrophils Absolute: 4.6 10*3/uL (ref 1.4–7.0)
Neutrophils: 64 %
Platelets: 217 10*3/uL (ref 150–450)
RBC: 3.92 x10E6/uL — ABNORMAL LOW (ref 4.14–5.80)
RDW: 13.8 % (ref 11.6–15.4)
WBC: 7.3 10*3/uL (ref 3.4–10.8)

## 2020-03-27 LAB — VITAMIN D 25 HYDROXY (VIT D DEFICIENCY, FRACTURES): Vit D, 25-Hydroxy: 22.3 ng/mL — ABNORMAL LOW (ref 30.0–100.0)

## 2020-03-27 LAB — CMP14+EGFR
ALT: 9 IU/L (ref 0–44)
AST: 11 IU/L (ref 0–40)
Albumin/Globulin Ratio: 1.4 (ref 1.2–2.2)
Albumin: 3.9 g/dL (ref 3.8–4.8)
Alkaline Phosphatase: 60 IU/L (ref 44–121)
BUN/Creatinine Ratio: 11 (ref 10–24)
BUN: 29 mg/dL — ABNORMAL HIGH (ref 8–27)
Bilirubin Total: <0.2 mg/dL (ref 0.0–1.2)
CO2: 20 mmol/L (ref 20–29)
Calcium: 9.1 mg/dL (ref 8.6–10.2)
Chloride: 107 mmol/L — ABNORMAL HIGH (ref 96–106)
Creatinine, Ser: 2.61 mg/dL — ABNORMAL HIGH (ref 0.76–1.27)
GFR calc Af Amer: 29 mL/min/{1.73_m2} — ABNORMAL LOW (ref >59)
GFR calc non Af Amer: 25 mL/min/{1.73_m2} — ABNORMAL LOW (ref >59)
Globulin, Total: 2.7 g/dL (ref 1.5–4.5)
Glucose: 167 mg/dL — ABNORMAL HIGH (ref 65–99)
Potassium: 5.5 mmol/L — ABNORMAL HIGH (ref 3.5–5.2)
Sodium: 141 mmol/L (ref 134–144)
Total Protein: 6.6 g/dL (ref 6.0–8.5)

## 2020-03-27 LAB — LIPID PANEL
Chol/HDL Ratio: 4.4 ratio (ref 0.0–5.0)
Cholesterol, Total: 158 mg/dL (ref 100–199)
HDL: 36 mg/dL — ABNORMAL LOW (ref >39)
LDL Chol Calc (NIH): 73 mg/dL (ref 0–99)
Triglycerides: 304 mg/dL — ABNORMAL HIGH (ref 0–149)
VLDL Cholesterol Cal: 49 mg/dL — ABNORMAL HIGH (ref 5–40)

## 2020-03-30 ENCOUNTER — Other Ambulatory Visit: Payer: Self-pay

## 2020-03-30 ENCOUNTER — Telehealth: Payer: Self-pay

## 2020-03-30 MED ORDER — VITAMIN D (ERGOCALCIFEROL) 1.25 MG (50000 UNIT) PO CAPS: 50000.0000 [IU] | ORAL_CAPSULE | ORAL | 1 refills | Status: DC

## 2020-03-30 NOTE — Telephone Encounter (Signed)
Yes go ahead and give her either a verbal or written order to discontinue the Xultophy, I did discontinue it.  What ever she needs.

## 2020-03-30 NOTE — Telephone Encounter (Signed)
[place] called and said there had been a recent change to David Zamora's medications and you had discontinued his Xultophy.  She needs an order to discontinue this sent to her.  Please advise.

## 2020-04-02 NOTE — Telephone Encounter (Signed)
Order faxed to d/c Xultophy to [phone] per [place]

## 2020-04-09 ENCOUNTER — Ambulatory Visit (INDEPENDENT_AMBULATORY_CARE_PROVIDER_SITE_OTHER): Payer: Medicare Other | Admitting: Pharmacist

## 2020-04-09 ENCOUNTER — Other Ambulatory Visit: Payer: Self-pay

## 2020-04-09 DIAGNOSIS — E119 Type 2 diabetes mellitus without complications: Secondary | ICD-10-CM | POA: Diagnosis not present

## 2020-04-09 MED ORDER — OZEMPIC (1 MG/DOSE) 2 MG/1.5ML ~~LOC~~ SOPN: 1.0000 mg | PEN_INJECTOR | SUBCUTANEOUS | 2 refills | Status: DC

## 2020-04-09 NOTE — Progress Notes (Signed)
    04/09/2020 Name: DESHON HSIAO MRN: [id number] DOB: 1960-11-16   S:  62 yoM Presents for diabetes evaluation, education, and management.  He resides in group home and his caregiver is with him for his visit today. Patient was referred and last seen by Primary Care Provider on 03/26/20.  Insurance coverage/medication affordability: medicare/medicaid  Patient reports adherence with medications. . Current diabetes medications include: ozempic, tresiba, novolog, farxiga . Current hypertension medications include: lisinopril Goal 130/80 . Current hyperlipidemia medications include: vascepa,simvastatin Patient denies hypoglycemic events.   Patient reported dietary habits: Eats 3 meals/day Caregiver reports balanced meals 3x daily, however patient is sneaking food and not telling staff.  . Snacks:patient is sneaking in snacks/desserts/cookies/sodas  . Drinks:drinking at least 30 oz of water per day; sneaking sodas  Patient-reported exercise habits: walks dog  Patient reports neuropathy (nerve pain).  O:  Lab Results  Component Value Date   HGBA1C 7.6 (H) 03/26/2020    Lipid Panel     Component Value Date/Time   CHOL 158 03/26/2020 0930   TRIG 304 (H) 03/26/2020 0930   HDL 36 (L) 03/26/2020 0930   CHOLHDL 4.4 03/26/2020 0930   LDLCALC 73 03/26/2020 0930    Libre report would not download, however I reviewed this data Average BG 176--patient having highs every evening above 200 due to sneaking in sweets/sodas/etc Home fasting blood sugars: 120-200 2 hour post-meal/random blood sugars: 200+.    Clinical Atherosclerotic Cardiovascular Disease (ASCVD): No   The 10-year ASCVD risk score Mikey Bussing DC Jr., et al., 2013) is: 19.4%   Values used to calculate the score:     Age: 51 years     Sex: Male     Is Non-Hispanic African American: No     Diabetic: Yes     Tobacco smoker: No     Systolic Blood Pressure: 357 mmHg     Is BP treated: Yes     HDL Cholesterol: 36 mg/dL      Total Cholesterol: 158 mg/dL   A/P:  Diabetes T2DM currently uncontrolled.Patient is adherent with medication because staff oversees this. Control is suboptimal due to diet/lifestyle.  -Continue Tresiba and Novolog  -Continue Farxiga--will increase to 10mg  daily at next visit  -Increase Ozempic to 1mg  after 0.5mg  sq weekly pen is completed.  Instructions printed on RX  -Discontinue metformin due to GFR 29  -Extensively discussed pathophysiology of diabetes, recommended lifestyle interventions, dietary effects on blood sugar control  -Counseled on s/sx of and management of hypoglycemia  -Next A1C anticipated 06/25/20.  Written patient instructions provided.  Total time in face to face counseling 20 minutes.   Follow up PCP Clinic Visit ON 06/25/20.   Regina Eck, PharmD, BCPS Clinical Pharmacist, [place]  II Phone [phone]

## 2020-04-11 ENCOUNTER — Telehealth: Payer: Medicare Other

## 2020-04-11 ENCOUNTER — Other Ambulatory Visit: Payer: Self-pay | Admitting: Family Medicine

## 2020-04-11 DIAGNOSIS — Z1211 Encounter for screening for malignant neoplasm of colon: Secondary | ICD-10-CM | POA: Diagnosis not present

## 2020-04-17 ENCOUNTER — Telehealth: Payer: Self-pay | Admitting: Pharmacist

## 2020-04-17 MED ORDER — DAPAGLIFLOZIN PROPANEDIOL 10 MG PO TABS: 10.0000 mg | ORAL_TABLET | Freq: Every day | ORAL | 1 refills | Status: DC

## 2020-04-17 NOTE — Telephone Encounter (Signed)
Most recent GFR 29 D/c metformin (order faxed to pharmacy and left up front for patients caregiver to pick up) We will increase Farxiga to 10mg  daily to aid with BG lowering and CKD protection

## 2020-04-19 LAB — COLOGUARD: COLOGUARD: NEGATIVE

## 2020-04-19 LAB — EXTERNAL GENERIC LAB PROCEDURE: COLOGUARD: NEGATIVE

## 2020-04-23 LAB — COLOGUARD: Cologuard: NEGATIVE

## 2020-05-07 ENCOUNTER — Other Ambulatory Visit: Payer: Self-pay

## 2020-05-07 ENCOUNTER — Ambulatory Visit (INDEPENDENT_AMBULATORY_CARE_PROVIDER_SITE_OTHER): Payer: Medicare Other | Admitting: Family Medicine

## 2020-05-07 ENCOUNTER — Other Ambulatory Visit: Payer: Self-pay | Admitting: Family Medicine

## 2020-05-07 ENCOUNTER — Encounter: Payer: Self-pay | Admitting: Family Medicine

## 2020-05-07 VITALS — BP 125/77 | HR 116 | Ht 65.0 in

## 2020-05-07 DIAGNOSIS — R251 Tremor, unspecified: Secondary | ICD-10-CM

## 2020-05-07 DIAGNOSIS — A084 Viral intestinal infection, unspecified: Secondary | ICD-10-CM

## 2020-05-07 MED ORDER — LOPERAMIDE HCL 2 MG PO TABS: 2.0000 mg | ORAL_TABLET | Freq: Four times a day (QID) | ORAL | 0 refills | Status: AC | PRN

## 2020-05-07 NOTE — Progress Notes (Signed)
BP 125/77   Pulse (!) 116   Ht '5\' 5"'  (1.651 m)   SpO2 93%   BMI 36.11 kg/m    Subjective:   Patient ID: David Zamora, male    DOB: 1960/09/08, 60 y.o.   MRN: [id number]  HPI: David Zamora is a 60 y.o. male presenting on 05/07/2020 for Tremors (Bilateral hands)   HPI Tremors Patient has been having a lot of tremors of been going on and off over the past year.  He has been dealing with this but it has been worsening.  He does see a psychiatrist and is on medicines for psychotropic help but none of them changed recently.  He is on an atypical antidepressant which could be a possibility but his tremors appear more essential tremor in nature.  We will do a referral to neurology  Patient comes in complaining of diarrhea that started last night and has had some loose bowel movements and some accidents that happened last night and today.  He says he has a mild amount of abdominal pain.  He denies any fevers or chills or blood in his stool.  Relevant past medical, surgical, family and social history reviewed and updated as indicated. Interim medical history since our last visit reviewed. Allergies and medications reviewed and updated.  Review of Systems  Constitutional: Negative for chills and fever.  Eyes: Negative for visual disturbance.  Respiratory: Negative for shortness of breath and wheezing.   Cardiovascular: Negative for chest pain and leg swelling.  Gastrointestinal: Positive for abdominal pain and diarrhea. Negative for blood in stool, constipation, nausea and vomiting.  Musculoskeletal: Negative for back pain and gait problem.  Skin: Negative for rash.  Neurological: Positive for tremors. Negative for weakness.  All other systems reviewed and are negative.   Per HPI unless specifically indicated above   Allergies as of 05/07/2020      Reactions   Soap    White soaps      Medication List       Accurate as of May 07, 2020  2:50 PM. If you have any questions,  ask your nurse or doctor.        blood glucose meter kit and supplies Kit Dispense based on patient and insurance preference. Use up to four times daily as directed. (FOR ICD-9 250.00, 250.01).   buPROPion 150 MG 24 hr tablet Commonly known as: WELLBUTRIN XL Take 150 mg by mouth every morning.   busPIRone 10 MG tablet Commonly known as: BUSPAR Take 10 mg by mouth 2 (two) times daily.   Carboxymeth-Glycerin-Polysorb 0.5-1-0.5 % Soln Apply 1 drop to eye 4 (four) times daily.   CeraVe Crea APPLY 1 APPLICATION ONCE A DAY TO BOTH FEET   dapagliflozin propanediol 10 MG Tabs tablet Commonly known as: FARXIGA Take 1 tablet (10 mg total) by mouth daily before breakfast.   divalproex 250 MG 24 hr tablet Commonly known as: DEPAKOTE ER Take 250-1,000 mg by mouth See admin instructions. 212m in the morning and 10064m at bedtime   fluticasone 50 MCG/ACT nasal spray Commonly known as: FLONASE 1 SPRAY EACH NOSTRIL TWICE DAILY AS NEEDED FOR ALLERGIES OR RHINITIS.   FORA Lancets Misc USE TO TEST BLOOD SUGAR 3 TIMES A DAY USE TO TEST BLOOD SUGAR AS NEEDED FOR SIGNS/SYMPTOMS OF HYPOGLYCEMIA (SHAKINESS, SWEATING, BLURRED VIS   FORA V30a Blood Glucose Test test strip Generic drug: glucose blood USE TO TEST BLOOD SUGAR 3 TIMES A DAY AS NEEDED FOR SIGNS/ SYMPTOMS  OF HYPOGLYCEMIA SHAKINESS, SWEATING, BLURRED VISION. Dx E11.9   FreeStyle Libre 14 Day Reader Kerrin Mo 1 each by Does not apply route 4 (four) times daily.   FreeStyle Libre 14 Day Sensor Misc 1 each by Does not apply route every 14 (fourteen) days.   furosemide 20 MG tablet Commonly known as: LASIX TAKE 1 TABLET BY MOUTH ONCE DAILY AS NEEDED FOR WEIGHT GAIN GREATER THAN 3 LBS   ibuprofen 200 MG tablet Commonly known as: Motrin IB Take 2 tablets (400 mg total) by mouth daily.   insulin degludec 100 UNIT/ML FlexTouch Pen Commonly known as: TRESIBA Inject 30 Units into the skin at bedtime.   lisinopril 10 MG tablet Commonly  known as: ZESTRIL TAKE (1) TABLET BY MOUTH ONCE DAILY FOR PROTEINURIA   NovoLOG FlexPen 100 UNIT/ML FlexPen Generic drug: insulin aspart INJECT 8 UNITS INTO THE SIN 3 TIMES DAILY AS NEEDED FOR HIGH BLOOD SUGAR. (GIVE FOR HIGH SUGARS WITH MEALS, IF<100 THEN HOLD).   omeprazole 20 MG capsule Commonly known as: PRILOSEC Take 1 capsule (20 mg total) by mouth daily.   Ozempic (1 MG/DOSE) 2 MG/1.5ML Sopn Generic drug: Semaglutide (1 MG/DOSE) Inject 1 mg into the skin once a week. Start 1 week after you finish 0.35m pen.  Discontinue 0.560mOzempic   PARoxetine 20 MG tablet Commonly known as: PAXIL Take 20 mg by mouth every morning.   QUEtiapine 300 MG 24 hr tablet Commonly known as: SEROQUEL XR Take 300 mg by mouth daily. Taken daily at 4pm   simvastatin 40 MG tablet Commonly known as: ZOCOR One tablet daily   tamsulosin 0.4 MG Caps capsule Commonly known as: FLOMAX TAKE 1 CAPSULE BY MOUTH ONCE A DAY.   Tegaderm Film 6"x8" Misc Use to cover free style libre sensor   tolnaftate 1 % spray Commonly known as: TINACTIN Apply topically 2 (two) times daily.   triamcinolone ointment 0.5 % Commonly known as: KENALOG APPLY TOPICALLY TWICE DAILY.   TRUEplus Pen Needles 31G X 8 MM Misc Generic drug: Insulin Pen Needle USE TO INJECT XULTOPHY DAILY   TUSSIN CF PO Take 5 mLs by mouth 4 (four) times daily as needed.   Vascepa 1 g capsule Generic drug: icosapent Ethyl TAKE (2) CAPSULES BY MOUTH TWICE DAILY.   Vitamin D (Ergocalciferol) 1.25 MG (50000 UNIT) Caps capsule Commonly known as: DRISDOL Take 1 capsule (50,000 Units total) by mouth every 7 (seven) days.   Vitamin D3 1.25 MG (50000 UT) Tabs Take 50,000 Units by mouth every Saturday.        Objective:   BP 125/77   Pulse (!) 116   Ht '5\' 5"'  (1.651 m)   SpO2 93%   BMI 36.11 kg/m   Wt Readings from Last 3 Encounters:  03/26/20 217 lb (98.4 kg)  12/27/19 218 lb (98.9 kg)  09/22/19 218 lb 8 oz (99.1 kg)     Physical Exam Vitals and nursing note reviewed.  Constitutional:      General: He is not in acute distress.    Appearance: He is well-developed and well-nourished. He is not diaphoretic.  Eyes:     General: No scleral icterus.    Extraocular Movements: EOM normal.     Conjunctiva/sclera: Conjunctivae normal.  Neck:     Thyroid: No thyromegaly.  Cardiovascular:     Rate and Rhythm: Normal rate and regular rhythm.     Pulses: Intact distal pulses.     Heart sounds: Normal heart sounds. No murmur heard.   Pulmonary:  Effort: Pulmonary effort is normal. No respiratory distress.     Breath sounds: Normal breath sounds. No wheezing.  Musculoskeletal:        General: No edema.     Cervical back: Neck supple.  Lymphadenopathy:     Cervical: No cervical adenopathy.  Skin:    General: Skin is warm and dry.     Findings: No rash.  Neurological:     Mental Status: He is alert and oriented to person, place, and time.     Motor: Tremor (Tremor at rest and with intention) present.     Coordination: Coordination normal.  Psychiatric:        Mood and Affect: Mood and affect normal.        Behavior: Behavior normal.       Assessment & Plan:   Problem List Items Addressed This Visit   None   Visit Diagnoses    Viral gastroenteritis    -  Primary   Relevant Medications   loperamide (IMODIUM A-D) 2 MG tablet   Tremor       Relevant Orders   Ambulatory referral to Neurology      Conservative management with Imodium for gastroenteritis and diarrhea.  Will refer to neurology for tremor, could be essential tremor but want to make sure it is not in the other type of tremor. Follow up plan: Return if symptoms worsen or fail to improve.  Counseling provided for all of the vaccine components No orders of the defined types were placed in this encounter.   Caryl Pina, MD [facility] 05/07/2020, 2:50 PM

## 2020-05-11 DIAGNOSIS — L851 Acquired keratosis [keratoderma] palmaris et plantaris: Secondary | ICD-10-CM | POA: Diagnosis not present

## 2020-05-11 DIAGNOSIS — E1142 Type 2 diabetes mellitus with diabetic polyneuropathy: Secondary | ICD-10-CM | POA: Diagnosis not present

## 2020-05-11 DIAGNOSIS — B351 Tinea unguium: Secondary | ICD-10-CM | POA: Diagnosis not present

## 2020-05-14 ENCOUNTER — Ambulatory Visit (INDEPENDENT_AMBULATORY_CARE_PROVIDER_SITE_OTHER): Payer: Medicare Other | Admitting: *Deleted

## 2020-05-14 DIAGNOSIS — Z0000 Encounter for general adult medical examination without abnormal findings: Secondary | ICD-10-CM
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
## 2020-05-14 NOTE — Patient Instructions (Signed)
  [place] Maintenance Summary and Written Plan of Care  Mr. David Zamora ,  Thank you for allowing me to perform your Medicare Annual Wellness Visit and for your ongoing commitment to your health.   Health Maintenance & Immunization History Health Maintenance  Topic Date Due  . OPHTHALMOLOGY EXAM  06/25/2020 (Originally 11/29/2019)  . HEMOGLOBIN A1C  09/24/2020  . FOOT EXAM  12/26/2020  . TETANUS/TDAP  04/12/2021  . Fecal DNA (Cologuard)  04/12/2023  . INFLUENZA VACCINE  Completed  . PNEUMOCOCCAL POLYSACCHARIDE VACCINE AGE 65-64 HIGH RISK  Completed  . COVID-19 Vaccine  Completed  . Hepatitis C Screening  Completed  . HIV Screening  Completed   Immunization History  Administered Date(s) Administered  . Influenza,inj,Quad PF,6+ Mos 03/26/2016, 01/15/2017, 03/22/2018, 03/02/2019, 12/26/2019  . Moderna Sars-Covid-2 Vaccination 06/20/2019, 07/19/2019, 02/24/2020  . Pneumococcal Conjugate-13 07/22/2016  . Pneumococcal Polysaccharide-23 05/06/2018  . Tdap 04/13/2011    These are the patient goals that we discussed: Goals Addressed            This Visit's Progress   . AWV       05/14/2020 AWV Goal: Exercise for General Health   Patient will verbalize understanding of the benefits of increased physical activity:  Exercising regularly is important. It will improve your overall fitness, flexibility, and endurance.  Regular exercise also will improve your overall health. It can help you control your weight, reduce stress, and improve your bone density.  Over the next year, patient will increase physical activity as tolerated with a goal of at least 150 minutes of moderate physical activity per week.   You can tell that you are exercising at a moderate intensity if your heart starts beating faster and you start breathing faster but can still hold a conversation.  Moderate-intensity exercise ideas include:  Walking 1 mile (1.6 km) in about 15  minutes  Biking  Hiking  Golfing  Dancing  Water aerobics  Patient will verbalize understanding of everyday activities that increase physical activity by providing examples like the following: ? Yard work, such as: ? Pushing a Conservation officer, nature ? Raking and bagging leaves ? Washing your car ? Pushing a stroller ? Shoveling snow ? Gardening ? Washing windows or floors  Patient will be able to explain general safety guidelines for exercising:   Before you start a new exercise program, talk with your health care provider.  Do not exercise so much that you hurt yourself, feel dizzy, or get very short of breath.  Wear comfortable clothes and wear shoes with good support.  Drink plenty of water while you exercise to prevent dehydration or heat stroke.  Work out until your breathing and your heartbeat get faster.         This is a list of Health Maintenance Items that are overdue or due now: There are no preventive care reminders to display for this patient.   Orders/Referrals Placed Today: No orders of the defined types were placed in this encounter.  (Contact our referral department at [phone] if you have not spoken with someone about your referral appointment within the next 5 days)    Follow-up Plan . Follow-up with David Zamora, Fransisca Kaufmann, MD as planned . Schedule yearly eye exam

## 2020-05-16 ENCOUNTER — Telehealth: Payer: Medicare Other

## 2020-05-24 ENCOUNTER — Encounter: Payer: Self-pay | Admitting: Family Medicine

## 2020-05-24 DIAGNOSIS — R251 Tremor, unspecified: Secondary | ICD-10-CM

## 2020-05-24 DIAGNOSIS — E1169 Type 2 diabetes mellitus with other specified complication: Secondary | ICD-10-CM

## 2020-05-28 NOTE — Progress Notes (Signed)
Assessment/Plan:   1.  Tremor, drug-induced  -Patient's tremor is multifactorial, and likely drug-induced.  He has 2 different types of tremor.  His most disabling tremor is likely from Depakote.  This is the action tremor that is seen on his examination.  He also has a little bit of a rest tremor on the left, most evident when he ambulates.  This is likely from quetiapine. 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  I talked to the patient and caregiver about this today.  I also talked to them about the fact that tremor induced by other medications generally does not respond well to attempts at treating it with additional medications.I would worry in him about more polypharmacy (he is already on a lot of medication).  He and I did talk about weighted gloves and weighted utensils.  He will talk about this with his other providers as well.    I certainly did not recommend that he change any of his medications from a psychiatry standpoint.  He will follow up with me on an as-needed basis, or if new neurologic symptoms arise.   Subjective:   David Zamora was seen today in the movement disorders clinic for neurologic consultation at the request of Dettinger, Fransisca Kaufmann, MD.  The consultation is for the evaluation of tremor.  Prior medical records made available to me are reviewed.  Patient is present with his caregiverwho supplements history.  Patient is a 60 year old male who lives in a group home who complains about tremor.  Tremor: Yes.   , caregiver thought that related to BS but they noted that even if BS good, he has tremor.  How long has it been going on? 2+ year  At rest or with activation?  activation  When is it noted the most?  Pouring, eating, writing  Fam hx of tremor?  No.  Located where?  Bilateral UE  Affected by caffeine:  No. (1 cup coffee/day and 1  soda per week)  Affected by alcohol:  Doesn't drink alcohol  Affected by stress:  Yes.    Affected by fatigue:  Yes.    Spills soup if on spoon:  Yes.    Spills glass of liquid if full:  Yes.    Affects ADL's (tying shoes, brushing teeth, etc):  No.  Tremor inducing meds:  Yes.  , VPA - been on it for years, Seroquel XR, 300 mg daily - pt not sure how long hes been on it  Other Specific Symptoms:  Voice: no change Sleep: sleeps well Wet Pillows: No. Postural symptoms:  No.  Falls?  No. Bradykinesia symptoms: no bradykinesia noted Loss of smell:  No. Loss of taste:  No. Urinary Incontinence:  No., has some frequency Trouble with ADL's:  No.  Trouble buttoning clothing: No.     ALLERGIES:   Allergies  Allergen Reactions  . Soap     White soaps    CURRENT MEDICATIONS:  Current Outpatient Medications  Medication Instructions  . ATHLETES FOOT SPRAY 1 % AERO APPLY TOPICALLY 2 TIMES A DAY.  . blood glucose meter kit and supplies KIT Dispense based on patient and insurance preference. Use up to four times daily as directed. (FOR ICD-9 250.00, 250.01).  [name] Kitchen buPROPion (WELLBUTRIN XL) 150 mg, Oral, Every morning  . busPIRone (BUSPAR) 10 mg, Oral, 2 times daily  . Carboxymeth-Glycerin-Polysorb  0.5-1-0.5 % SOLN 1 drop, Ophthalmic, 4 times daily  . Continuous Blood Gluc Receiver (FREESTYLE LIBRE 14 DAY READER) DEVI 1 each, Does not apply, 4 times daily  . Continuous Blood Gluc Sensor (FREESTYLE LIBRE 14 DAY SENSOR) MISC 1 each, Does not apply, Every 14 days  . dapagliflozin propanediol (FARXIGA) 10 mg, Oral, Daily before breakfast  . divalproex (DEPAKOTE ER) 250-1,000 mg, Oral, See admin instructions, 26m in the morning and 100100m at bedtime  . Emollient (CERAVE) CREA APPLY 1 APPLICATION ONCE A DAY TO BOTH FEET  . fluticasone (FLONASE) 50 MCG/ACT nasal spray 1 SPRAY EACH NOSTRIL TWICE DAILY AS NEEDED FOR ALLERGIES OR RHINITIS.  . [name] KitchenORA Lancets MISC USE TO TEST BLOOD SUGAR 3 TIMES A  DAY USE TO TEST BLOOD SUGAR AS NEEDED FOR SIGNS/SYMPTOMS OF HYPOGLYCEMIA (SHAKINESS, SWEATING, BLURRED VIS  . furosemide (LASIX) 20 MG tablet TAKE 1 TABLET BY MOUTH ONCE DAILY AS NEEDED FOR WEIGHT GAIN GREATER THAN 3 LBS  . glucose blood (FORA V30A BLOOD GLUCOSE TEST) test strip USE TO TEST BLOOD SUGAR 3 TIMES A DAY AS NEEDED FOR SIGNS/ SYMPTOMS OF HYPOGLYCEMIA SHAKINESS, SWEATING, BLURRED VISION. Dx E11.9  . ibuprofen (MOTRIN IB) 400 mg, Oral, Daily  . insulin aspart (NOVOLOG FLEXPEN) 100 UNIT/ML FlexPen INJECT 8 UNITS INTO THE SIN 3 TIMES DAILY AS NEEDED FOR HIGH BLOOD SUGAR. (GIVE FOR HIGH SUGARS WITH MEALS, IF<100 THEN HOLD).  . [name] Kitchennsulin degludec (TRESIBA) 30 Units, Subcutaneous, Daily at bedtime  . lisinopril (ZESTRIL) 10 MG tablet TAKE (1) TABLET BY MOUTH ONCE DAILY FOR PROTEINURIA  . loperamide (IMODIUM A-D) 2 mg, Oral, 4 times daily PRN  . omeprazole (PRILOSEC) 20 mg, Oral, Daily  . Ozempic (1 MG/DOSE) 1 mg, Subcutaneous, Weekly, Start 1 week after you finish 0.79m62men.  Discontinue 0.79mg76mempic  . PARoxetine (PAXIL) 20 mg, Every morning  . Phenylephrine-DM-GG (TUSSIN CF PO) 5 mLs, Oral, 4 times daily PRN  . QUEtiapine (SEROQUEL XR) 300 mg, Oral, Daily, Taken daily at 4pm   . simvastatin (ZOCOR) 40 MG tablet One tablet daily  . tamsulosin (FLOMAX) 0.4 MG CAPS capsule TAKE 1 CAPSULE BY MOUTH ONCE A DAY.  . toMarland Kitchennaftate (TINACTIN) 1 % spray Topical, 2 times daily  . Transparent Dressings (TEGADERM FILM 6"X8") MISC Use to cover free style libre sensor  . triamcinolone ointment (KENALOG) 0.5 % APPLY TOPICALLY TWICE DAILY.  . TRMarland KitchenEPLUS PEN NEEDLES 31G X 8 MM MISC USE TO INJECT XULTOPHY DAILY  . VASCEPA 1 g capsule TAKE (2) CAPSULES BY MOUTH TWICE DAILY.  . ViMarland Kitchenamin D (Ergocalciferol) (DRISDOL) 50,000 Units, Oral, Every 7 days  . Vitamin D3 50,000 Units, Oral, Every Sat    Objective:   PHYSICAL EXAMINATION:    VITALS:  There were no vitals filed for this visit.  GEN:  The patient appears  stated age and is in NAD. HEENT:  Normocephalic, atraumatic.  The mucous membranes are moist. The superficial temporal arteries are without ropiness or tenderness. CV:  RRR Lungs:  CTAB Neck/HEME:  There are no carotid bruits bilaterally.  Neurological examination:  Orientation: The patient is alert and oriented x3.  Cranial nerves: There is good facial symmetry.  Extraocular muscles are intact. The visual fields are full to confrontational testing. The speech is fluent and clear. Soft palate rises symmetrically and there is no tongue deviation. Hearing is intact to conversational tone. Sensation: Sensation is intact to light touch throughout (facial, trunk, extremities). Vibration is intact at the bilateral big toe. There is  no extinction with double simultaneous stimulation.  Motor: Strength is 5/5 in the bilateral upper and lower extremities.   Shoulder shrug is equal and symmetric.  There is no pronator drift. Deep tendon reflexes: Deep tendon reflexes are 2/4 at the bilateral biceps, triceps, brachioradialis, patella and achilles. Plantar responses are downgoing bilaterally.  Movement examination: Tone: There is normal tone in the bilateral upper extremities.  The tone in the lower extremities is normal.  Abnormal movements: there is rare LUE rest tremor.  There is L hand tremor with ambulation.  There is postural tremor bilaterally, L>R.  He has trouble with archimedes spirals, L>R.  he has some difficulty when asked to pour a full glass of water from one glass to another. Coordination:  There is no decremation with RAM's, with any form of RAMS, including alternating supination and pronation of the forearm, hand opening and closing, finger taps, heel taps and toe taps. Gait and Station: The patient has no difficulty arising out of a deep-seated chair without the use of the hands. The patient's stride length is good.   I have reviewed and interpreted the following labs independently    Chemistry      Component Value Date/Time   NA 141 03/26/2020 0930   NA 133 (L) 02/20/2013 1229   K 5.5 (H) 03/26/2020 0930   K 4.1 02/20/2013 1229   CL 107 (H) 03/26/2020 0930   CL 100 02/20/2013 1229   CO2 20 03/26/2020 0930   CO2 28 02/20/2013 1229   BUN 29 (H) 03/26/2020 0930   BUN 27 (H) 02/20/2013 1229   CREATININE 2.61 (H) 03/26/2020 0930   CREATININE 1.60 (H) 02/20/2013 1229      Component Value Date/Time   CALCIUM 9.1 03/26/2020 0930   CALCIUM 9.1 02/20/2013 1229   ALKPHOS 60 03/26/2020 0930   ALKPHOS 121 02/20/2013 1229   AST 11 03/26/2020 0930   AST 31 02/20/2013 1229   ALT 9 03/26/2020 0930   ALT 26 02/20/2013 1229   BILITOT <0.2 03/26/2020 0930   BILITOT 0.3 02/20/2013 1229      Lab Results  Component Value Date   TSH 2.850 12/27/2019   Lab Results  Component Value Date   WBC 7.3 03/26/2020   HGB 12.8 (L) 03/26/2020   HCT 37.2 (L) 03/26/2020   MCV 95 03/26/2020   PLT 217 03/26/2020      Total time spent on today's visit was 51 minutes, including both face-to-face time and nonface-to-face time.  Time included that spent on review of records (prior notes available to me/labs/imaging if pertinent), discussing treatment and goals, answering patient's questions and coordinating care.  Cc:  Dettinger, Fransisca Kaufmann, MD

## 2020-05-29 ENCOUNTER — Encounter: Payer: Self-pay | Admitting: Neurology

## 2020-05-29 ENCOUNTER — Other Ambulatory Visit: Payer: Self-pay

## 2020-05-29 ENCOUNTER — Ambulatory Visit (INDEPENDENT_AMBULATORY_CARE_PROVIDER_SITE_OTHER): Payer: Medicare Other | Admitting: Neurology

## 2020-05-29 VITALS — BP 112/68 | HR 76 | Ht 71.0 in | Wt 203.0 lb

## 2020-05-29 DIAGNOSIS — G251 Drug-induced tremor: Secondary | ICD-10-CM

## 2020-05-29 NOTE — Patient Instructions (Signed)
1.  Consider getting hand weights via amaz[employer]: wvly[employer]com  2.  Consider using weighted spoons and forks  3.  Let us know if new neurologic issues arise.    The physicians and staff at [facility] are committed to providing excellent care. You may receive a survey requesting feedback about your experience at our office. We strive to receive "very good" responses to the survey questions. If you feel that your experience would prevent you from giving the office a "very good " response, please contact our office to try to remedy the situation. We may be reached at [phone]. Thank you for taking the time out of your busy day to complete the survey.

## 2020-05-30 DIAGNOSIS — F319 Bipolar disorder, unspecified: Secondary | ICD-10-CM | POA: Diagnosis not present

## 2020-06-12 ENCOUNTER — Other Ambulatory Visit: Payer: Self-pay | Admitting: Family Medicine

## 2020-06-13 DIAGNOSIS — F319 Bipolar disorder, unspecified: Secondary | ICD-10-CM | POA: Diagnosis not present

## 2020-06-14 ENCOUNTER — Ambulatory Visit (INDEPENDENT_AMBULATORY_CARE_PROVIDER_SITE_OTHER): Payer: Medicare Other | Admitting: Nurse Practitioner

## 2020-06-14 ENCOUNTER — Other Ambulatory Visit: Payer: Self-pay

## 2020-06-14 ENCOUNTER — Encounter: Payer: Self-pay | Admitting: Nurse Practitioner

## 2020-06-14 VITALS — BP 111/74 | HR 96 | Ht 71.0 in | Wt 202.0 lb

## 2020-06-14 DIAGNOSIS — Z794 Long term (current) use of insulin: Secondary | ICD-10-CM | POA: Diagnosis not present

## 2020-06-14 DIAGNOSIS — I152 Hypertension secondary to endocrine disorders: Secondary | ICD-10-CM

## 2020-06-14 DIAGNOSIS — E781 Pure hyperglyceridemia: Secondary | ICD-10-CM

## 2020-06-14 DIAGNOSIS — N184 Chronic kidney disease, stage 4 (severe): Secondary | ICD-10-CM | POA: Diagnosis not present

## 2020-06-14 DIAGNOSIS — E1169 Type 2 diabetes mellitus with other specified complication: Secondary | ICD-10-CM | POA: Diagnosis not present

## 2020-06-14 DIAGNOSIS — E1159 Type 2 diabetes mellitus with other circulatory complications: Secondary | ICD-10-CM | POA: Diagnosis not present

## 2020-06-14 DIAGNOSIS — E1122 Type 2 diabetes mellitus with diabetic chronic kidney disease: Secondary | ICD-10-CM

## 2020-06-14 DIAGNOSIS — E782 Mixed hyperlipidemia: Secondary | ICD-10-CM

## 2020-06-14 MED ORDER — NOVOLOG FLEXPEN 100 UNIT/ML ~~LOC~~ SOPN: 8.0000 [IU] | PEN_INJECTOR | Freq: Three times a day (TID) | SUBCUTANEOUS | 3 refills | Status: DC

## 2020-06-14 NOTE — Patient Instructions (Signed)
Diabetes Mellitus and Nutrition, Adult When you have diabetes, or diabetes mellitus, it is very important to have healthy eating habits because your blood sugar (glucose) levels are greatly affected by what you eat and drink. Eating healthy foods in the right amounts, at about the same times every day, can help you:  Control your blood glucose.  Lower your risk of heart disease.  Improve your blood pressure.  Reach or maintain a healthy weight. What can affect my meal plan? Every person with diabetes is different, and each person has different needs for a meal plan. Your health care provider may recommend that you work with a dietitian to make a meal plan that is best for you. Your meal plan may vary depending on factors such as:  The calories you need.  The medicines you take.  Your weight.  Your blood glucose, blood pressure, and cholesterol levels.  Your activity level.  Other health conditions you have, such as heart or kidney disease. How do carbohydrates affect me? Carbohydrates, also called carbs, affect your blood glucose level more than any other type of food. Eating carbs naturally raises the amount of glucose in your blood. Carb counting is a method for keeping track of how many carbs you eat. Counting carbs is important to keep your blood glucose at a healthy level, especially if you use insulin or take certain oral diabetes medicines. It is important to know how many carbs you can safely have in each meal. This is different for every person. Your dietitian can help you calculate how many carbs you should have at each meal and for each snack. How does alcohol affect me? Alcohol can cause a sudden decrease in blood glucose (hypoglycemia), especially if you use insulin or take certain oral diabetes medicines. Hypoglycemia can be a life-threatening condition. Symptoms of hypoglycemia, such as sleepiness, dizziness, and confusion, are similar to symptoms of having too much  alcohol.  Do not drink alcohol if: ? Your health care provider tells you not to drink. ? You are pregnant, may be pregnant, or are planning to become pregnant.  If you drink alcohol: ? Do not drink on an empty stomach. ? Limit how much you use to:  0-1 drink a day for women.  0-2 drinks a day for men. ? Be aware of how much alcohol is in your drink. In the [place] one drink equals one 12 oz bottle of beer (355 mL), one 5 oz glass of wine (148 mL), or one 1 oz glass of hard liquor (44 mL). ? Keep yourself hydrated with water, diet soda, or unsweetened iced tea.  Keep in mind that regular soda, juice, and other mixers may contain a lot of sugar and must be counted as carbs. What are tips for following this plan? Reading food labels  Start by checking the serving size on the "Nutrition Facts" label of packaged foods and drinks. The amount of calories, carbs, fats, and other nutrients listed on the label is based on one serving of the item. Many items contain more than one serving per package.  Check the total grams (g) of carbs in one serving. You can calculate the number of servings of carbs in one serving by dividing the total carbs by 15. For example, if a food has 30 g of total carbs per serving, it would be equal to 2 servings of carbs.  Check the number of grams (g) of saturated fats and trans fats in one serving. Choose foods that have   a low amount or none of these fats.  Check the number of milligrams (mg) of salt (sodium) in one serving. Most people should limit total sodium intake to less than 2,300 mg per day.  Always check the nutrition information of foods labeled as "low-fat" or "nonfat." These foods may be higher in added sugar or refined carbs and should be avoided.  Talk to your dietitian to identify your daily goals for nutrients listed on the label. Shopping  Avoid buying canned, pre-made, or processed foods. These foods tend to be high in fat, sodium, and added  sugar.  Shop around the outside edge of the grocery store. This is where you will most often find fresh fruits and vegetables, bulk grains, fresh meats, and fresh dairy. Cooking  Use low-heat cooking methods, such as baking, instead of high-heat cooking methods like deep frying.  Cook using healthy oils, such as olive, canola, or sunflower oil.  Avoid cooking with butter, cream, or high-fat meats. Meal planning  Eat meals and snacks regularly, preferably at the same times every day. Avoid going long periods of time without eating.  Eat foods that are high in fiber, such as fresh fruits, vegetables, beans, and whole grains. Talk with your dietitian about how many servings of carbs you can eat at each meal.  Eat 4-6 oz (112-168 g) of lean protein each day, such as lean meat, chicken, fish, eggs, or tofu. One ounce (oz) of lean protein is equal to: ? 1 oz (28 g) of meat, chicken, or fish. ? 1 egg. ?  cup (62 g) of tofu.  Eat some foods each day that contain healthy fats, such as avocado, nuts, seeds, and fish.   What foods should I eat? Fruits Berries. Apples. Oranges. Peaches. Apricots. Plums. Grapes. Mango. Papaya. Pomegranate. Kiwi. Cherries. Vegetables Lettuce. Spinach. Leafy greens, including kale, chard, collard greens, and mustard greens. Beets. Cauliflower. Cabbage. Broccoli. Carrots. Green beans. Tomatoes. Peppers. Onions. Cucumbers. Brussels sprouts. Grains Whole grains, such as whole-wheat or whole-grain bread, crackers, tortillas, cereal, and pasta. Unsweetened oatmeal. Quinoa. Brown or wild rice. Meats and other proteins Seafood. Poultry without skin. Lean cuts of poultry and beef. Tofu. Nuts. Seeds. Dairy Low-fat or fat-free dairy products such as milk, yogurt, and cheese. The items listed above may not be a complete list of foods and beverages you can eat. Contact a dietitian for more information. What foods should I avoid? Fruits Fruits canned with  syrup. Vegetables Canned vegetables. Frozen vegetables with butter or cream sauce. Grains Refined white flour and flour products such as bread, pasta, snack foods, and cereals. Avoid all processed foods. Meats and other proteins Fatty cuts of meat. Poultry with skin. Breaded or fried meats. Processed meat. Avoid saturated fats. Dairy Full-fat yogurt, cheese, or milk. Beverages Sweetened drinks, such as soda or iced tea. The items listed above may not be a complete list of foods and beverages you should avoid. Contact a dietitian for more information. Questions to ask a health care provider  Do I need to meet with a diabetes educator?  Do I need to meet with a dietitian?  What number can I call if I have questions?  When are the best times to check my blood glucose? Where to find more information:  American Diabetes Association: diabetes.org  Academy of Nutrition and Dietetics: www.eatright.org  National Institute of Diabetes and Digestive and Kidney Diseases: www.niddk.nih.gov  Association of Diabetes Care and Education Specialists: www.diabeteseducator.org Summary  It is important to have healthy eating   habits because your blood sugar (glucose) levels are greatly affected by what you eat and drink.  A healthy meal plan will help you control your blood glucose and maintain a healthy lifestyle.  Your health care provider may recommend that you work with a dietitian to make a meal plan that is best for you.  Keep in mind that carbohydrates (carbs) and alcohol have immediate effects on your blood glucose levels. It is important to count carbs and to use alcohol carefully. This information is not intended to replace advice given to you by your health care provider. Make sure you discuss any questions you have with your health care provider. Document Revised: 03/08/2019 Document Reviewed: 03/08/2019 Elsevier Patient Education  2021 [employer]  
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
## 2020-06-15 ENCOUNTER — Telehealth: Payer: Medicare Other

## 2020-06-20 ENCOUNTER — Other Ambulatory Visit: Payer: Self-pay | Admitting: Family Medicine

## 2020-06-21 ENCOUNTER — Other Ambulatory Visit: Payer: Self-pay

## 2020-06-21 ENCOUNTER — Other Ambulatory Visit: Payer: Medicare Other

## 2020-06-21 DIAGNOSIS — E1159 Type 2 diabetes mellitus with other circulatory complications: Secondary | ICD-10-CM | POA: Diagnosis not present

## 2020-06-21 DIAGNOSIS — E1169 Type 2 diabetes mellitus with other specified complication: Secondary | ICD-10-CM

## 2020-06-21 DIAGNOSIS — I152 Hypertension secondary to endocrine disorders: Secondary | ICD-10-CM

## 2020-06-21 DIAGNOSIS — E781 Pure hyperglyceridemia: Secondary | ICD-10-CM

## 2020-06-21 DIAGNOSIS — N1832 Chronic kidney disease, stage 3b: Secondary | ICD-10-CM

## 2020-06-21 LAB — BAYER DCA HB A1C WAIVED: HB A1C (BAYER DCA - WAIVED): 7.1 % — ABNORMAL HIGH (ref <7.0)

## 2020-06-22 DIAGNOSIS — D631 Anemia in chronic kidney disease: Secondary | ICD-10-CM | POA: Diagnosis not present

## 2020-06-22 DIAGNOSIS — E1129 Type 2 diabetes mellitus with other diabetic kidney complication: Secondary | ICD-10-CM | POA: Diagnosis not present

## 2020-06-22 DIAGNOSIS — N189 Chronic kidney disease, unspecified: Secondary | ICD-10-CM | POA: Diagnosis not present

## 2020-06-22 DIAGNOSIS — I129 Hypertensive chronic kidney disease with stage 1 through stage 4 chronic kidney disease, or unspecified chronic kidney disease: Secondary | ICD-10-CM | POA: Diagnosis not present

## 2020-06-22 DIAGNOSIS — R809 Proteinuria, unspecified: Secondary | ICD-10-CM | POA: Diagnosis not present

## 2020-06-22 DIAGNOSIS — E1122 Type 2 diabetes mellitus with diabetic chronic kidney disease: Secondary | ICD-10-CM | POA: Diagnosis not present

## 2020-06-22 DIAGNOSIS — E875 Hyperkalemia: Secondary | ICD-10-CM | POA: Diagnosis not present

## 2020-06-22 LAB — CBC WITH DIFFERENTIAL/PLATELET
Basophils Absolute: 0 10*3/uL (ref 0.0–0.2)
Basos: 1 %
EOS (ABSOLUTE): 0.1 10*3/uL (ref 0.0–0.4)
Eos: 2 %
Hematocrit: 36.1 % — ABNORMAL LOW (ref 37.5–51.0)
Hemoglobin: 12.3 g/dL — ABNORMAL LOW (ref 13.0–17.7)
Immature Grans (Abs): 0 10*3/uL (ref 0.0–0.1)
Immature Granulocytes: 0 %
Lymphocytes Absolute: 2.4 10*3/uL (ref 0.7–3.1)
Lymphs: 44 %
MCH: 32.5 pg (ref 26.6–33.0)
MCHC: 34.1 g/dL (ref 31.5–35.7)
MCV: 95 fL (ref 79–97)
Monocytes Absolute: 0.4 10*3/uL (ref 0.1–0.9)
Monocytes: 8 %
Neutrophils Absolute: 2.6 10*3/uL (ref 1.4–7.0)
Neutrophils: 45 %
Platelets: 234 10*3/uL (ref 150–450)
RBC: 3.79 x10E6/uL — ABNORMAL LOW (ref 4.14–5.80)
RDW: 14.1 % (ref 11.6–15.4)
WBC: 5.5 10*3/uL (ref 3.4–10.8)

## 2020-06-22 LAB — LIPID PANEL
Chol/HDL Ratio: 3.2 ratio (ref 0.0–5.0)
Cholesterol, Total: 145 mg/dL (ref 100–199)
HDL: 46 mg/dL (ref >39)
LDL Chol Calc (NIH): 76 mg/dL (ref 0–99)
Triglycerides: 131 mg/dL (ref 0–149)
VLDL Cholesterol Cal: 23 mg/dL (ref 5–40)

## 2020-06-22 LAB — CMP14+EGFR
ALT: 10 IU/L (ref 0–44)
AST: 14 IU/L (ref 0–40)
Albumin/Globulin Ratio: 2.5 — ABNORMAL HIGH (ref 1.2–2.2)
Albumin: 4.3 g/dL (ref 3.8–4.8)
Alkaline Phosphatase: 66 IU/L (ref 44–121)
BUN/Creatinine Ratio: 14 (ref 10–24)
BUN: 29 mg/dL — ABNORMAL HIGH (ref 8–27)
Bilirubin Total: <0.2 mg/dL (ref 0.0–1.2)
CO2: 21 mmol/L (ref 20–29)
Calcium: 9 mg/dL (ref 8.6–10.2)
Chloride: 107 mmol/L — ABNORMAL HIGH (ref 96–106)
Creatinine, Ser: 2.09 mg/dL — ABNORMAL HIGH (ref 0.76–1.27)
Globulin, Total: 1.7 g/dL (ref 1.5–4.5)
Glucose: 70 mg/dL (ref 65–99)
Potassium: 4.7 mmol/L (ref 3.5–5.2)
Sodium: 144 mmol/L (ref 134–144)
Total Protein: 6 g/dL (ref 6.0–8.5)
eGFR: 35 mL/min/{1.73_m2} — ABNORMAL LOW (ref >59)

## 2020-06-22 LAB — VITAMIN D 25 HYDROXY (VIT D DEFICIENCY, FRACTURES): Vit D, 25-Hydroxy: 40 ng/mL (ref 30.0–100.0)

## 2020-06-25 ENCOUNTER — Ambulatory Visit (INDEPENDENT_AMBULATORY_CARE_PROVIDER_SITE_OTHER): Payer: Medicare Other | Admitting: Family Medicine

## 2020-06-25 ENCOUNTER — Encounter: Payer: Self-pay | Admitting: Family Medicine

## 2020-06-25 ENCOUNTER — Other Ambulatory Visit: Payer: Self-pay

## 2020-06-25 VITALS — BP 124/87 | HR 80 | Ht 71.0 in | Wt 200.0 lb

## 2020-06-25 DIAGNOSIS — T7421XA Adult sexual abuse, confirmed, initial encounter: Secondary | ICD-10-CM

## 2020-06-25 DIAGNOSIS — E1159 Type 2 diabetes mellitus with other circulatory complications: Secondary | ICD-10-CM

## 2020-06-25 DIAGNOSIS — N1832 Chronic kidney disease, stage 3b: Secondary | ICD-10-CM

## 2020-06-25 DIAGNOSIS — E1169 Type 2 diabetes mellitus with other specified complication: Secondary | ICD-10-CM | POA: Diagnosis not present

## 2020-06-25 DIAGNOSIS — E781 Pure hyperglyceridemia: Secondary | ICD-10-CM

## 2020-06-25 DIAGNOSIS — I152 Hypertension secondary to endocrine disorders: Secondary | ICD-10-CM | POA: Diagnosis not present
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
## 2020-06-28 ENCOUNTER — Encounter: Payer: Self-pay | Admitting: Nurse Practitioner

## 2020-06-28 ENCOUNTER — Ambulatory Visit: Payer: Medicare Other | Admitting: Nurse Practitioner

## 2020-06-28 ENCOUNTER — Other Ambulatory Visit: Payer: Self-pay

## 2020-06-28 ENCOUNTER — Ambulatory Visit (INDEPENDENT_AMBULATORY_CARE_PROVIDER_SITE_OTHER): Payer: Medicare Other | Admitting: Nurse Practitioner

## 2020-06-28 VITALS — BP 120/77 | HR 74 | Ht 71.0 in | Wt 201.2 lb

## 2020-06-28 DIAGNOSIS — E1159 Type 2 diabetes mellitus with other circulatory complications: Secondary | ICD-10-CM | POA: Diagnosis not present

## 2020-06-28 DIAGNOSIS — N1832 Chronic kidney disease, stage 3b: Secondary | ICD-10-CM

## 2020-06-28 DIAGNOSIS — Z794 Long term (current) use of insulin: Secondary | ICD-10-CM | POA: Diagnosis not present

## 2020-06-28 DIAGNOSIS — E782 Mixed hyperlipidemia: Secondary | ICD-10-CM | POA: Diagnosis not present

## 2020-06-28 DIAGNOSIS — I152 Hypertension secondary to endocrine disorders: Secondary | ICD-10-CM

## 2020-06-28 DIAGNOSIS — E1122 Type 2 diabetes mellitus with diabetic chronic kidney disease: Secondary | ICD-10-CM | POA: Diagnosis not present

## 2020-06-28 NOTE — Patient Instructions (Signed)
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
## 2020-07-12 ENCOUNTER — Telehealth: Payer: Medicare Other

## 2020-07-17 ENCOUNTER — Other Ambulatory Visit: Payer: Self-pay

## 2020-07-17 ENCOUNTER — Telehealth: Payer: Self-pay

## 2020-07-17 DIAGNOSIS — E1169 Type 2 diabetes mellitus with other specified complication: Secondary | ICD-10-CM

## 2020-07-17 MED ORDER — NOVOLOG FLEXPEN 100 UNIT/ML ~~LOC~~ SOPN: 4.0000 [IU] | PEN_INJECTOR | Freq: Three times a day (TID) | SUBCUTANEOUS | 3 refills | Status: DC

## 2020-07-17 MED ORDER — INSULIN DEGLUDEC 100 UNIT/ML ~~LOC~~ SOPN: 15.0000 [IU] | PEN_INJECTOR | Freq: Every day | SUBCUTANEOUS | 3 refills | Status: DC

## 2020-07-17 NOTE — Telephone Encounter (Signed)
Done new scripts are sent

## 2020-07-17 NOTE — Telephone Encounter (Signed)
Can you call [facility]? They are needing some clarification on the patients insulin.

## 2020-07-20 DIAGNOSIS — B351 Tinea unguium: Secondary | ICD-10-CM | POA: Diagnosis not present

## 2020-07-20 DIAGNOSIS — E1142 Type 2 diabetes mellitus with diabetic polyneuropathy: Secondary | ICD-10-CM | POA: Diagnosis not present

## 2020-07-20 DIAGNOSIS — L84 Corns and callosities: Secondary | ICD-10-CM | POA: Diagnosis not present

## 2020-08-01 ENCOUNTER — Telehealth: Payer: Medicare Other

## 2020-08-14 DIAGNOSIS — S8001XA Contusion of right knee, initial encounter: Secondary | ICD-10-CM | POA: Diagnosis not present

## 2020-08-15 ENCOUNTER — Other Ambulatory Visit: Payer: Self-pay

## 2020-08-15 ENCOUNTER — Encounter: Payer: Self-pay | Admitting: Nurse Practitioner

## 2020-08-15 ENCOUNTER — Ambulatory Visit (INDEPENDENT_AMBULATORY_CARE_PROVIDER_SITE_OTHER): Payer: Medicare Other | Admitting: Nurse Practitioner

## 2020-08-15 VITALS — BP 102/72 | HR 86 | Temp 98.1°F | Ht 71.0 in | Wt 196.0 lb

## 2020-08-15 DIAGNOSIS — R42 Dizziness and giddiness: Secondary | ICD-10-CM

## 2020-08-15 MED ORDER — MECLIZINE HCL 12.5 MG PO TABS: 12.5000 mg | ORAL_TABLET | Freq: Three times a day (TID) | ORAL | 1 refills | Status: DC | PRN
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
## 2020-08-15 NOTE — Patient Instructions (Signed)

## 2020-08-15 NOTE — Assessment & Plan Note (Signed)
New onset vertigo, completed orthostatic BP no significant changes found. Education provided to caregiver to observe patient in the future for time of incidence and monitor blood glucose levels. Completed neuro checks patient is intact with no deficits.  Meclizine RX sent to pharmacy, appointment for medication management made with clinical pharmacist.  Follow up with worsening or unresolved symptoms.

## 2020-08-16 ENCOUNTER — Other Ambulatory Visit: Payer: Medicare Other

## 2020-08-16 DIAGNOSIS — I129 Hypertensive chronic kidney disease with stage 1 through stage 4 chronic kidney disease, or unspecified chronic kidney disease: Secondary | ICD-10-CM | POA: Diagnosis not present

## 2020-08-16 DIAGNOSIS — E875 Hyperkalemia: Secondary | ICD-10-CM | POA: Diagnosis not present

## 2020-08-16 DIAGNOSIS — R809 Proteinuria, unspecified: Secondary | ICD-10-CM | POA: Diagnosis not present

## 2020-08-16 DIAGNOSIS — N189 Chronic kidney disease, unspecified: Secondary | ICD-10-CM | POA: Diagnosis not present

## 2020-08-16 DIAGNOSIS — D631 Anemia in chronic kidney disease: Secondary | ICD-10-CM | POA: Diagnosis not present

## 2020-08-16 DIAGNOSIS — E1122 Type 2 diabetes mellitus with diabetic chronic kidney disease: Secondary | ICD-10-CM | POA: Diagnosis not present

## 2020-08-16 DIAGNOSIS — E1129 Type 2 diabetes mellitus with other diabetic kidney complication: Secondary | ICD-10-CM | POA: Diagnosis not present

## 2020-08-23 ENCOUNTER — Ambulatory Visit: Payer: Medicare Other | Admitting: Pharmacist

## 2020-08-23 DIAGNOSIS — I129 Hypertensive chronic kidney disease with stage 1 through stage 4 chronic kidney disease, or unspecified chronic kidney disease: Secondary | ICD-10-CM | POA: Diagnosis not present

## 2020-08-23 DIAGNOSIS — F319 Bipolar disorder, unspecified: Secondary | ICD-10-CM | POA: Diagnosis not present

## 2020-08-23 DIAGNOSIS — E875 Hyperkalemia: Secondary | ICD-10-CM | POA: Diagnosis not present

## 2020-08-23 DIAGNOSIS — R809 Proteinuria, unspecified: Secondary | ICD-10-CM | POA: Diagnosis not present

## 2020-08-23 DIAGNOSIS — E1129 Type 2 diabetes mellitus with other diabetic kidney complication: Secondary | ICD-10-CM | POA: Diagnosis not present

## 2020-08-23 DIAGNOSIS — E1122 Type 2 diabetes mellitus with diabetic chronic kidney disease: Secondary | ICD-10-CM | POA: Diagnosis not present

## 2020-08-23 DIAGNOSIS — N189 Chronic kidney disease, unspecified: Secondary | ICD-10-CM | POA: Diagnosis not present

## 2020-08-30 ENCOUNTER — Other Ambulatory Visit: Payer: Self-pay | Admitting: Family Medicine

## 2020-08-31 ENCOUNTER — Ambulatory Visit: Payer: Medicare Other | Admitting: Pharmacist

## 2020-09-07 ENCOUNTER — Ambulatory Visit (INDEPENDENT_AMBULATORY_CARE_PROVIDER_SITE_OTHER): Payer: Medicare Other | Admitting: Licensed Clinical Social Worker

## 2020-09-07 DIAGNOSIS — E1159 Type 2 diabetes mellitus with other circulatory complications: Secondary | ICD-10-CM | POA: Diagnosis not present

## 2020-09-07 DIAGNOSIS — F3341 Major depressive disorder, recurrent, in partial remission: Secondary | ICD-10-CM | POA: Diagnosis not present

## 2020-09-07 DIAGNOSIS — E781 Pure hyperglyceridemia: Secondary | ICD-10-CM

## 2020-09-07 DIAGNOSIS — E1169 Type 2 diabetes mellitus with other specified complication: Secondary | ICD-10-CM

## 2020-09-07 DIAGNOSIS — N1832 Chronic kidney disease, stage 3b: Secondary | ICD-10-CM

## 2020-09-07 DIAGNOSIS — K219 Gastro-esophageal reflux disease without esophagitis: Secondary | ICD-10-CM

## 2020-09-07 DIAGNOSIS — I152 Hypertension secondary to endocrine disorders: Secondary | ICD-10-CM | POA: Diagnosis not present

## 2020-09-07 NOTE — Chronic Care Management (AMB) (Signed)
Chronic Care Management    Clinical Social Work Note  09/07/2020 Name: David Zamora MRN: [id number] DOB: 08-01-1960  David Zamora is a 60 y.o. year old male who is a primary care patient of Dettinger, Fransisca Kaufmann, MD. The CCM team was consulted to assist the patient with chronic disease management and/or care coordination needs related to: [employer] .   Engaged with patient by telephone for follow up visit in response to provider referral for social work chronic care management and care coordination services.   Consent to Services:  The patient was given information about Chronic Care Management services, agreed to services, and gave verbal consent prior to initiation of services.  Please see initial visit note for detailed documentation.   Patient agreed to services and consent obtained.   Assessment: Review of patient past medical history, allergies, medications, and health status, including review of relevant consultants reports was performed today as part of a comprehensive evaluation and provision of chronic care management and care coordination services.     SDOH (Social Determinants of Health) assessments and interventions performed:  SDOH Interventions   Flowsheet Row Most Recent Value  SDOH Interventions   Depression Interventions/Treatment  Currently on Treatment       Advanced Directives Status: See Vynca application for related entries.  CCM Care Plan  Allergies  Allergen Reactions  . Soap     White soaps    Outpatient Encounter Medications as of 09/07/2020  Medication Sig  . ATHLETES FOOT SPRAY 1 % AERO APPLY TOPICALLY 2 TIMES A DAY.  . blood glucose meter kit and supplies KIT Dispense based on patient and insurance preference. Use up to four times daily as directed. (FOR ICD-9 250.00, 250.01).  [name] Kitchen buPROPion (WELLBUTRIN XL) 150 MG 24 hr tablet Take 150 mg by mouth every morning.  . busPIRone (BUSPAR) 10 MG tablet Take 10 mg by mouth 2 (two) times daily.   . Carboxymeth-Glycerin-Polysorb 0.5-1-0.5 % SOLN Apply 1 drop to eye 4 (four) times daily.  . Continuous Blood Gluc Receiver (FREESTYLE LIBRE 14 DAY READER) DEVI 1 each by Does not apply route 4 (four) times daily.  . Continuous Blood Gluc Sensor (FREESTYLE LIBRE 14 DAY SENSOR) MISC 1 each by Does not apply route every 14 (fourteen) days.  . dapagliflozin propanediol (FARXIGA) 10 MG TABS tablet Take by mouth daily.  . divalproex (DEPAKOTE ER) 250 MG 24 hr tablet Take 250-1,000 mg by mouth See admin instructions. 239m in the morning and 10082m at bedtime  . Emollient (CERAVE) CREA APPLY 1 APPLICATION ONCE A DAY TO BOTH FEET  . fluticasone (FLONASE) 50 MCG/ACT nasal spray 1 SPRAY EACH NOSTRIL TWICE DAILY AS NEEDED FOR ALLERGIES OR RHINITIS.  . [name] KitchenORA Lancets MISC USE TO TEST BLOOD SUGAR 3 TIMES A DAY USE TO TEST BLOOD SUGAR AS NEEDED FOR SIGNS/SYMPTOMS OF HYPOGLYCEMIA (SHAKINESS, SWEATING, BLURRED VIS  . furosemide (LASIX) 20 MG tablet TAKE 1 TABLET BY MOUTH ONCE DAILY AS NEEDED FOR WEIGHT GAIN GREATER THAN 3 LBS  . glucose blood (FORA V30A BLOOD GLUCOSE TEST) test strip USE TO TEST BLOOD SUGAR 3 TIMES A DAY AS NEEDED FOR SIGNS/ SYMPTOMS OF HYPOGLYCEMIA SHAKINESS, SWEATING, BLURRED VISION. Dx E11.9  . ibuprofen (MOTRIN IB) 200 MG tablet Take 2 tablets (400 mg total) by mouth daily.  . insulin aspart (NOVOLOG FLEXPEN) 100 UNIT/ML FlexPen Inject 4-7 Units into the skin 3 (three) times daily with meals. If glucose is above 90 and he is eating.  Follow sliding  scale if glucose is above 150 for additional insulin.  If glucose 151-250 give additional 1 unit for total of 5 units with meal; if glucose 251-350 give additional 2 units for total of 6 units with meal; if glucose 351 or higher give additional 3 units for total of 7 units with meals.  . insulin degludec (TRESIBA) 100 UNIT/ML FlexTouch Pen Inject 15 Units into the skin at bedtime.  [name] Kitchen lisinopril (ZESTRIL) 10 MG tablet TAKE (1) TABLET BY MOUTH ONCE  DAILY FOR PROTEINURIA  . loperamide (IMODIUM A-D) 2 MG tablet Take 1 tablet (2 mg total) by mouth 4 (four) times daily as needed for diarrhea or loose stools.  . meclizine (ANTIVERT) 12.5 MG tablet Take 1 tablet (12.5 mg total) by mouth 3 (three) times daily as needed for dizziness.  [name] Kitchen omeprazole (PRILOSEC) 20 MG capsule Take 1 capsule (20 mg total) by mouth daily.  [name] Kitchen OZEMPIC, 1 MG/DOSE, 4 MG/3ML SOPN INJECT 1 MG INTO THE SKIN ONCE A WEEK. START 1 WEEK AFTER FINISHED WITH O.5MG PEN.  [name] Kitchen PARoxetine (PAXIL) 20 MG tablet Take 20 mg by mouth every morning.  [name] Kitchen Phenylephrine-DM-GG (TUSSIN CF PO) Take 5 mLs by mouth 4 (four) times daily as needed.  [name] Kitchen QUEtiapine (SEROQUEL XR) 300 MG 24 hr tablet Take 300 mg by mouth daily. Taken daily at 4pm  . Semaglutide, 1 MG/DOSE, (OZEMPIC, 1 MG/DOSE,) 2 MG/1.5ML SOPN Inject 1 mg into the skin once a week. Start 1 week after you finish 0.57m pen.  Discontinue 0.578mOzempic  . simvastatin (ZOCOR) 40 MG tablet One tablet daily  . tamsulosin (FLOMAX) 0.4 MG CAPS capsule TAKE 1 CAPSULE BY MOUTH ONCE A DAY.  . [name] Kitchenolnaftate (TINACTIN) 1 % spray Apply topically 2 (two) times daily.  . Transparent Dressings (TEGADERM FILM 6"X8") MISC Use to cover free style libre sensor  . triamcinolone ointment (KENALOG) 0.5 % APPLY TOPICALLY TWICE DAILY.  . [name] KitchenRUEPLUS PEN NEEDLES 31G X 8 MM MISC USE TO INJECT XULTOPHY DAILY  . VASCEPA 1 g capsule TAKE (2) CAPSULES BY MOUTH TWICE DAILY.  . [name] Kitchenitamin D, Ergocalciferol, (DRISDOL) 1.25 MG (50000 UNIT) CAPS capsule Take 50,000 Units by mouth once a week.   No facility-administered encounter medications on file as of 09/07/2020.    Patient Active Problem List   Diagnosis Date Noted  . Vertigo 08/15/2020  . Chronic kidney disease (CKD), stage III (moderate) ([facility]/03/2019  . Type 2 diabetes mellitus with hypertriglyceridemia ([facility]11/10/2016  . Intellectual disability 12/24/2015  . Hypertension associated with diabetes ([facility]09/02/2016  .  Depression 12/24/2015  . GERD (gastroesophageal reflux disease) 12/24/2015    Conditions to be addressed/monitored: Monitor client management of depression   Care Plan : LCSW care plan  Updates made by FoKatha CabalLCSW since 09/07/2020 12:00 AM    Problem: Emotional Distress     Goal: Emotional Health Supported;Manage Depression issues   Start Date: 09/07/2020  Expected End Date: 12/08/2020  This Visit's Progress: On track  Priority: Medium  Note:   Current Barriers:  . Chronic Mental Health needs related to depression issues . Pain issues . Suicidal Ideation/Homicidal Ideation: No  Clinical Social Work Goal(s):  . patient will work with SW monthly by telephone or in person to reduce or manage symptoms related to depression and depression management  . Patient will work with SW monthly to discuss pain issues of client and to discuss mobility issues of client   Interventions: . 1:1 collaboration with Dettinger, JoFransisca KaufmannMD  regarding development and update of comprehensive plan of care as evidenced by provider attestation and co-signature . Talked with client about sleeping issues of client . Talked with client about support  from Dr. Lottie Dawson , Pharmacist at [facility] . Talked with client about weight loss of client( client  said he has lost some weight) . Talked with client about ambulation of client . Talked with client about pain issues of client  . Talked with client about appetite of client . Talked with client about upcoming client medical appointments at [facility]  Patient Self Care Activities:  . Attends all scheduled provider appointments . Performs ADL's independently  Patient Coping Strengths:  . Family . Friends  Patient Self Care Deficits:  . Some Pain issues . Mobility issues  Patient Goals:  - spend time or talk with others at least 2 to 3 times per week - practice relaxation or meditation daily - keep a calendar with appointment dates  Follow Up  Plan: LCSW to call client on 10/23/20       [name].Finnis Colee MSW, LCSW Licensed Clinical Social Worker [facility] Care Management [phone]

## 2020-09-07 NOTE — Patient Instructions (Signed)
Visit Information  PATIENT GOALS: Goals Addressed            This Visit's Progress   . Manage My Emotions       Timeframe:  Short-Term Goal Priority:  Medium Progress: On Track Start Date:     09/07/20                        Expected End Date:   12/08/20                    Follow Up Date 10/23/20   {Manage My Emotions (Patient)  ;Manage Depression Issues    Why is this important?    When you are stressed, down or upset, your body reacts too.   For example, your blood pressure may get higher; you may have a headache or stomachache.   When your emotions get the best of you, your body's ability to fight off cold and flu gets weak.   These steps will help you manage your emotions.     Patient Self Care Activities:  . Attends all scheduled provider appointments . Performs ADL's independently  Patient Coping Strengths:  . Family . Friends  Patient Self Care Deficits:  . Some Pain issues . Mobility issues  Patient Goals:  - spend time or talk with others at least 2 to 3 times per week - practice relaxation or meditation daily - keep a calendar with appointment dates  Follow Up Plan: LCSW to call client on 10/23/20       [name].Tahlia Deamer MSW, LCSW Licensed Clinical Social Worker [facility] Care Management [phone]

## 2020-09-13 ENCOUNTER — Other Ambulatory Visit: Payer: Self-pay

## 2020-09-13 ENCOUNTER — Other Ambulatory Visit: Payer: Medicare Other

## 2020-09-13 DIAGNOSIS — I129 Hypertensive chronic kidney disease with stage 1 through stage 4 chronic kidney disease, or unspecified chronic kidney disease: Secondary | ICD-10-CM | POA: Diagnosis not present

## 2020-09-13 DIAGNOSIS — N189 Chronic kidney disease, unspecified: Secondary | ICD-10-CM | POA: Diagnosis not present

## 2020-09-13 DIAGNOSIS — E1129 Type 2 diabetes mellitus with other diabetic kidney complication: Secondary | ICD-10-CM | POA: Diagnosis not present

## 2020-09-13 DIAGNOSIS — R809 Proteinuria, unspecified: Secondary | ICD-10-CM | POA: Diagnosis not present

## 2020-09-13 DIAGNOSIS — E1122 Type 2 diabetes mellitus with diabetic chronic kidney disease: Secondary | ICD-10-CM | POA: Diagnosis not present

## 2020-09-13 DIAGNOSIS — E875 Hyperkalemia: Secondary | ICD-10-CM | POA: Diagnosis not present

## 2020-09-18 ENCOUNTER — Other Ambulatory Visit: Payer: Self-pay

## 2020-09-18 ENCOUNTER — Ambulatory Visit (INDEPENDENT_AMBULATORY_CARE_PROVIDER_SITE_OTHER): Payer: Medicare Other | Admitting: Pharmacist

## 2020-09-18 DIAGNOSIS — E1169 Type 2 diabetes mellitus with other specified complication: Secondary | ICD-10-CM | POA: Diagnosis not present

## 2020-09-18 DIAGNOSIS — E781 Pure hyperglyceridemia: Secondary | ICD-10-CM | POA: Diagnosis not present

## 2020-09-18 NOTE — Progress Notes (Signed)
    09/18/2020 Name: David Zamora MRN: [id number] DOB: 1960/08/04   S:  60 yoM Presents for diabetes evaluation, education, and management.  He resides in group home and his caregiver is with him for his visit today. Patient was referred and last seen by Primary Care Provider on 06/2020   Insurance coverage/medication affordability: medicare/medicaid   Patient reports adherence with medications. Current diabetes medications include: ozempic, tresiba, novolog, farxiga Current hypertension medications include: lisinopril Goal 130/80 Current hyperlipidemia medications include: vascepa,simvastatin Patient denies hypoglycemic events.   Patient reported dietary habits: Eats 3 meals/day Caregiver reports balanced meals 3x daily, however patient is sneaking food and not telling staff.   Snacks:patient is sneaking in snacks/desserts/cookies/sodas   Drinks:drinking at least 30 oz of water per day; sneaking sodas   Patient-reported exercise habits: walks dog   Patient reports neuropathy (nerve pain).    O:  Lab Results  Component Value Date   HGBA1C 7.1 (H) 06/21/2020    Lipid Panel     Component Value Date/Time   CHOL 145 06/21/2020 1521   TRIG 131 06/21/2020 1521   HDL 46 06/21/2020 1521   CHOLHDL 3.2 06/21/2020 1521   LDLCALC 76 06/21/2020 1521        Clinical Atherosclerotic Cardiovascular Disease (ASCVD): No   The 10-year ASCVD risk score Mikey Bussing DC Jr., et al., 2013) is: 19.9%   Values used to calculate the score:     Age: 60 years     Sex: Male     Is Non-Hispanic African American: No     Diabetic: Yes     Tobacco smoker: No     Systolic Blood Pressure: 0000000 mmHg     Is BP treated: Yes     HDL Cholesterol: 46 mg/dL     Total Cholesterol: 145 mg/dL     A/P:  Diabetes T2DM currently uncontrolled.Patient is adherent with medication because staff oversees this. Control is suboptimal due to diet/lifestyle.   -Continue Tresiba and Novolog  Blood sugars are much  improved-REVIEWED in depth with patient and caregiver  May have to adjust sliding scale--post prandials are still elevated  , however provided additional dietary support and recommendations  -Continue Farxiga '10mg'$  daily gfr 29   -Continue Ozempic to '1mg'$  sq weekly  Denies personal and family history of Medullary thyroid cancer (MTC)   -Extensively discussed pathophysiology of diabetes, recommended lifestyle interventions, dietary effects on blood sugar control  -Counseled on s/sx of and management of hypoglycemia  Written patient instructions provided.  Total time in face to face counseling 25 minutes.   Regina Eck, PharmD, BCPS Clinical Pharmacist, [place]  II Phone [phone]

## 2020-09-27 ENCOUNTER — Other Ambulatory Visit: Payer: Self-pay

## 2020-09-27 ENCOUNTER — Ambulatory Visit (INDEPENDENT_AMBULATORY_CARE_PROVIDER_SITE_OTHER): Payer: Medicare Other | Admitting: Family Medicine

## 2020-09-27 ENCOUNTER — Encounter: Payer: Self-pay | Admitting: Family Medicine

## 2020-09-27 VITALS — BP 104/68 | HR 87 | Ht 71.0 in | Wt 194.0 lb

## 2020-09-27 DIAGNOSIS — E781 Pure hyperglyceridemia: Secondary | ICD-10-CM

## 2020-09-27 DIAGNOSIS — E1159 Type 2 diabetes mellitus with other circulatory complications: Secondary | ICD-10-CM | POA: Diagnosis not present

## 2020-09-27 DIAGNOSIS — E1169 Type 2 diabetes mellitus with other specified complication: Secondary | ICD-10-CM

## 2020-09-27 DIAGNOSIS — N1832 Chronic kidney disease, stage 3b: Secondary | ICD-10-CM

## 2020-09-27 DIAGNOSIS — I152 Hypertension secondary to endocrine disorders: Secondary | ICD-10-CM | POA: Diagnosis not present

## 2020-09-27 LAB — BAYER DCA HB A1C WAIVED: HB A1C (BAYER DCA - WAIVED): 6.9 % (ref <7.0)

## 2020-09-27 NOTE — Progress Notes (Signed)
BP 104/68   Pulse 87   Ht _0  (1.803 m)   Wt 194 lb (88 kg)   SpO2 97%   BMI 27.06 kg/m    Subjective:   Patient ID: David Zamora, male    DOB: 1960-05-22, 60 y.o.   MRN: [id number]  HPI: PHUC KLUTTZ is a 60 y.o. male presenting on 09/27/2020 for Medical Management of Chronic Issues and Diabetes   HPI Type 2 diabetes mellitus Patient comes in today for recheck of his diabetes. Patient has been currently taking NovoLog mealtime and sliding scale and Ozempic and [place]. Patient is currently on an ACE inhibitor/ARB. Patient has seen an ophthalmologist this year. Patient denies any issues with their feet. The symptom started onset as an adult diabetes and hypertension and CKD ARE RELATED TO DM   Hypertension Patient is currently on lisinopril and furosemide, and their blood pressure today is 104/68. Patient denies any lightheadedness or dizziness. Patient denies headaches, blurred vision, chest pains, shortness of breath, or weakness. Denies any side effects from medication and is content with current medication.   Relevant past medical, surgical, family and social history reviewed and updated as indicated. Interim medical history since our last visit reviewed. Allergies and medications reviewed and updated.  Review of Systems  Constitutional:  Negative for chills and fever.  Respiratory:  Negative for shortness of breath and wheezing.   Cardiovascular:  Negative for chest pain and leg swelling.  Musculoskeletal:  Negative for back pain and gait problem.  Skin:  Negative for rash.  Neurological:  Negative for dizziness, weakness and light-headedness.  All other systems reviewed and are negative.  Per HPI unless specifically indicated above   Allergies as of 09/27/2020       Reactions   Soap    White soaps        Medication List        Accurate as of September 27, 2020  1:05 PM. If you have any questions, ask your nurse or doctor.          blood glucose meter  kit and supplies Kit Dispense based on patient and insurance preference. Use up to four times daily as directed. (FOR ICD-9 250.00, 250.01).   buPROPion 150 MG 24 hr tablet Commonly known as: WELLBUTRIN XL Take 150 mg by mouth every morning.   busPIRone 10 MG tablet Commonly known as: BUSPAR Take 10 mg by mouth 2 (two) times daily.   Carboxymeth-Glycerin-Polysorb 0.5-1-0.5 % Soln Apply 1 drop to eye 4 (four) times daily.   CeraVe Crea APPLY 1 APPLICATION ONCE A DAY TO BOTH FEET   dapagliflozin propanediol 10 MG Tabs tablet Commonly known as: FARXIGA Take by mouth daily.   divalproex 250 MG 24 hr tablet Commonly known as: DEPAKOTE ER Take 250-1,000 mg by mouth See admin instructions. 285m in the morning and 10033m at bedtime   fluticasone 50 MCG/ACT nasal spray Commonly known as: FLONASE 1 SPRAY EACH NOSTRIL TWICE DAILY AS NEEDED FOR ALLERGIES OR RHINITIS.   FORA Lancets Misc USE TO TEST BLOOD SUGAR 3 TIMES A DAY USE TO TEST BLOOD SUGAR AS NEEDED FOR SIGNS/SYMPTOMS OF HYPOGLYCEMIA (SHAKINESS, SWEATING, BLURRED VIS   FORA V30a Blood Glucose Test test strip Generic drug: glucose blood USE TO TEST BLOOD SUGAR 3 TIMES A DAY AS NEEDED FOR SIGNS/ SYMPTOMS OF HYPOGLYCEMIA SHAKINESS, SWEATING, BLURRED VISION. Dx E11.9   FreeStyle Libre 14 Day Reader DeKerrin Mo each by Does not apply route 4 (four) times  daily.   FreeStyle Libre 14 Day Sensor Misc 1 each by Does not apply route every 14 (fourteen) days.   furosemide 20 MG tablet Commonly known as: LASIX TAKE 1 TABLET BY MOUTH ONCE DAILY AS NEEDED FOR WEIGHT GAIN GREATER THAN 3 LBS   ibuprofen 200 MG tablet Commonly known as: Motrin IB Take 2 tablets (400 mg total) by mouth daily.   insulin degludec 100 UNIT/ML FlexTouch Pen Commonly known as: TRESIBA Inject 15 Units into the skin at bedtime.   lisinopril 10 MG tablet Commonly known as: ZESTRIL TAKE (1) TABLET BY MOUTH ONCE DAILY FOR PROTEINURIA   loperamide 2 MG  tablet Commonly known as: Imodium A-D Take 1 tablet (2 mg total) by mouth 4 (four) times daily as needed for diarrhea or loose stools.   meclizine 12.5 MG tablet Commonly known as: ANTIVERT Take 1 tablet (12.5 mg total) by mouth 3 (three) times daily as needed for dizziness.   NovoLOG FlexPen 100 UNIT/ML FlexPen Generic drug: insulin aspart Inject 4-7 Units into the skin 3 (three) times daily with meals. If glucose is above 90 and he is eating.  Follow sliding scale if glucose is above 150 for additional insulin.  If glucose 151-250 give additional 1 unit for total of 5 units with meal; if glucose 251-350 give additional 2 units for total of 6 units with meal; if glucose 351 or higher give additional 3 units for total of 7 units with meals.   omeprazole 20 MG capsule Commonly known as: PRILOSEC Take 1 capsule (20 mg total) by mouth daily.   Ozempic (1 MG/DOSE) 2 MG/1.5ML Sopn Generic drug: Semaglutide (1 MG/DOSE) Inject 1 mg into the skin once a week. Start 1 week after you finish 0.23m pen.  Discontinue 0.544mOzempic   Ozempic (1 MG/DOSE) 4 MG/3ML Sopn Generic drug: Semaglutide (1 MG/DOSE) INJECT 1 MG INTO THE SKIN ONCE A WEEK. START 1 WEEK AFTER FINISHED WITH O.5MG PEN.   PARoxetine 20 MG tablet Commonly known as: PAXIL Take 20 mg by mouth every morning.   QUEtiapine 300 MG 24 hr tablet Commonly known as: SEROQUEL XR Take 300 mg by mouth daily. Taken daily at 4pm   simvastatin 40 MG tablet Commonly known as: ZOCOR One tablet daily   tamsulosin 0.4 MG Caps capsule Commonly known as: FLOMAX TAKE 1 CAPSULE BY MOUTH ONCE A DAY.   Tegaderm Film 6"x8" Misc Use to cover free style libre sensor   tolnaftate 1 % spray Commonly known as: TINACTIN Apply topically 2 (two) times daily.   Athletes Foot Spray 1 % Aero Generic drug: Tolnaftate APPLY TOPICALLY 2 TIMES A DAY.   triamcinolone ointment 0.5 % Commonly known as: KENALOG APPLY TOPICALLY TWICE DAILY.   TRUEplus Pen  Needles 31G X 8 MM Misc Generic drug: Insulin Pen Needle USE TO INJECT XULTOPHY DAILY   TUSSIN CF PO Take 5 mLs by mouth 4 (four) times daily as needed.   Vascepa 1 g capsule Generic drug: icosapent Ethyl TAKE (2) CAPSULES BY MOUTH TWICE DAILY.   Vitamin D (Ergocalciferol) 1.25 MG (50000 UNIT) Caps capsule Commonly known as: DRISDOL Take 50,000 Units by mouth once a week.         Objective:   BP 104/68   Pulse 87   Ht _0  (1.803 m)   Wt 194 lb (88 kg)   SpO2 97%   BMI 27.06 kg/m   Wt Readings from Last 3 Encounters:  09/27/20 194 lb (88 kg)  08/15/20 196  lb (88.9 kg)  06/28/20 201 lb 3.2 oz (91.3 kg)    Physical Exam Vitals and nursing note reviewed.  Constitutional:      General: He is not in acute distress.    Appearance: He is well-developed. He is not diaphoretic.  Eyes:     General: No scleral icterus.    Conjunctiva/sclera: Conjunctivae normal.  Neck:     Thyroid: No thyromegaly.  Cardiovascular:     Rate and Rhythm: Normal rate and regular rhythm.     Heart sounds: Normal heart sounds. No murmur heard. Pulmonary:     Effort: Pulmonary effort is normal. No respiratory distress.     Breath sounds: Normal breath sounds. No wheezing.  Musculoskeletal:        General: No swelling. Normal range of motion.     Cervical back: Neck supple.  Lymphadenopathy:     Cervical: No cervical adenopathy.  Skin:    General: Skin is warm and dry.     Findings: No rash.  Neurological:     Mental Status: He is alert and oriented to person, place, and time.     Coordination: Coordination normal.  Psychiatric:        Behavior: Behavior normal.      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  For breakfast only were going to change it to start at 3 and go up to 6 rather than 4-7.  No other change in medication, A1c looks good at 6.9.  Follow up plan: Return in about 3 months (around 12/28/2020), or if symptoms worsen or fail to improve, for Diabetes and CKD and hypertension.  Counseling provided for all of the vaccine components Orders Placed This Encounter  Procedures   CBC with Differential/Platelet   CMP14+EGFR   Lipid panel   Bayer DCA Hb A1c Waived    Caryl Pina, MD [facility] 09/27/2020, 1:05 PM

## 2020-09-28 DIAGNOSIS — E1142 Type 2 diabetes mellitus with diabetic polyneuropathy: Secondary | ICD-10-CM | POA: Diagnosis not present

## 2020-09-28 DIAGNOSIS — B351 Tinea unguium: Secondary | ICD-10-CM | POA: Diagnosis not present

## 2020-09-28 DIAGNOSIS — L84 Corns and callosities: Secondary | ICD-10-CM | POA: Diagnosis not present

## 2020-09-28 LAB — LIPID PANEL
Chol/HDL Ratio: 3.1 ratio (ref 0.0–5.0)
Cholesterol, Total: 130 mg/dL (ref 100–199)
HDL: 42 mg/dL (ref >39)
LDL Chol Calc (NIH): 54 mg/dL (ref 0–99)
Triglycerides: 213 mg/dL — ABNORMAL HIGH (ref 0–149)
VLDL Cholesterol Cal: 34 mg/dL (ref 5–40)

## 2020-09-28 LAB — CBC WITH DIFFERENTIAL/PLATELET
Basophils Absolute: 0 10*3/uL (ref 0.0–0.2)
Basos: 0 %
EOS (ABSOLUTE): 0.1 10*3/uL (ref 0.0–0.4)
Eos: 1 %
Hematocrit: 37.2 % — ABNORMAL LOW (ref 37.5–51.0)
Hemoglobin: 12.9 g/dL — ABNORMAL LOW (ref 13.0–17.7)
Immature Grans (Abs): 0.1 10*3/uL (ref 0.0–0.1)
Immature Granulocytes: 1 %
Lymphocytes Absolute: 1.2 10*3/uL (ref 0.7–3.1)
Lymphs: 14 %
MCH: 32.8 pg (ref 26.6–33.0)
MCHC: 34.7 g/dL (ref 31.5–35.7)
MCV: 95 fL (ref 79–97)
Monocytes Absolute: 0.5 10*3/uL (ref 0.1–0.9)
Monocytes: 5 %
Neutrophils Absolute: 6.9 10*3/uL (ref 1.4–7.0)
Neutrophils: 79 %
Platelets: 225 10*3/uL (ref 150–450)
RBC: 3.93 x10E6/uL — ABNORMAL LOW (ref 4.14–5.80)
RDW: 12.6 % (ref 11.6–15.4)
WBC: 8.7 10*3/uL (ref 3.4–10.8)

## 2020-09-28 LAB — CMP14+EGFR
ALT: 8 IU/L (ref 0–44)
AST: 10 IU/L (ref 0–40)
Albumin/Globulin Ratio: 2.3 — ABNORMAL HIGH (ref 1.2–2.2)
Albumin: 4.1 g/dL (ref 3.8–4.8)
Alkaline Phosphatase: 57 IU/L (ref 44–121)
BUN/Creatinine Ratio: 11 (ref 10–24)
BUN: 29 mg/dL — ABNORMAL HIGH (ref 8–27)
Bilirubin Total: <0.2 mg/dL (ref 0.0–1.2)
CO2: 22 mmol/L (ref 20–29)
Calcium: 8.8 mg/dL (ref 8.6–10.2)
Chloride: 102 mmol/L (ref 96–106)
Creatinine, Ser: 2.53 mg/dL — ABNORMAL HIGH (ref 0.76–1.27)
Globulin, Total: 1.8 g/dL (ref 1.5–4.5)
Glucose: 185 mg/dL — ABNORMAL HIGH (ref 65–99)
Potassium: 5.4 mmol/L — ABNORMAL HIGH (ref 3.5–5.2)
Sodium: 138 mmol/L (ref 134–144)
Total Protein: 5.9 g/dL — ABNORMAL LOW (ref 6.0–8.5)
eGFR: 28 mL/min/{1.73_m2} — ABNORMAL LOW (ref >59)

## 2020-10-01 ENCOUNTER — Ambulatory Visit: Payer: Medicare Other | Admitting: Nurse Practitioner

## 2020-10-10 ENCOUNTER — Other Ambulatory Visit: Payer: Self-pay

## 2020-10-10 ENCOUNTER — Encounter: Payer: Self-pay | Admitting: Nurse Practitioner

## 2020-10-10 ENCOUNTER — Ambulatory Visit (INDEPENDENT_AMBULATORY_CARE_PROVIDER_SITE_OTHER): Payer: Medicare Other | Admitting: Nurse Practitioner

## 2020-10-10 VITALS — BP 113/68 | HR 71 | Ht 71.0 in | Wt 189.0 lb

## 2020-10-10 DIAGNOSIS — E1169 Type 2 diabetes mellitus with other specified complication: Secondary | ICD-10-CM | POA: Diagnosis not present

## 2020-10-10 DIAGNOSIS — N184 Chronic kidney disease, stage 4 (severe): Secondary | ICD-10-CM

## 2020-10-10 DIAGNOSIS — I152 Hypertension secondary to endocrine disorders: Secondary | ICD-10-CM | POA: Diagnosis not present

## 2020-10-10 DIAGNOSIS — E782 Mixed hyperlipidemia: Secondary | ICD-10-CM

## 2020-10-10 DIAGNOSIS — E1159 Type 2 diabetes mellitus with other circulatory complications: Secondary | ICD-10-CM | POA: Diagnosis not present

## 2020-10-10 DIAGNOSIS — Z794 Long term (current) use of insulin: Secondary | ICD-10-CM

## 2020-10-10 DIAGNOSIS — E781 Pure hyperglyceridemia: Secondary | ICD-10-CM

## 2020-10-10 DIAGNOSIS — E1122 Type 2 diabetes mellitus with diabetic chronic kidney disease: Secondary | ICD-10-CM | POA: Diagnosis not present

## 2020-10-10 LAB — POCT UA - MICROALBUMIN
Creatinine, POC: 300 mg/dL
Microalbumin Ur, POC: 150 mg/L

## 2020-10-10 MED ORDER — NOVOLOG FLEXPEN 100 UNIT/ML ~~LOC~~ SOPN: 3.0000 [IU] | PEN_INJECTOR | Freq: Three times a day (TID) | SUBCUTANEOUS | 3 refills | Status: DC

## 2020-10-10 NOTE — Patient Instructions (Signed)
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
## 2020-10-23 ENCOUNTER — Ambulatory Visit (INDEPENDENT_AMBULATORY_CARE_PROVIDER_SITE_OTHER): Payer: Medicare Other | Admitting: Licensed Clinical Social Worker

## 2020-10-23 DIAGNOSIS — N1832 Chronic kidney disease, stage 3b: Secondary | ICD-10-CM

## 2020-10-23 DIAGNOSIS — I152 Hypertension secondary to endocrine disorders: Secondary | ICD-10-CM

## 2020-10-23 DIAGNOSIS — K219 Gastro-esophageal reflux disease without esophagitis: Secondary | ICD-10-CM

## 2020-10-23 DIAGNOSIS — E1159 Type 2 diabetes mellitus with other circulatory complications: Secondary | ICD-10-CM | POA: Diagnosis not present

## 2020-10-23 DIAGNOSIS — E781 Pure hyperglyceridemia: Secondary | ICD-10-CM | POA: Diagnosis not present

## 2020-10-23 DIAGNOSIS — F3341 Major depressive disorder, recurrent, in partial remission: Secondary | ICD-10-CM | POA: Diagnosis not present

## 2020-10-23 DIAGNOSIS — F79 Unspecified intellectual disabilities: Secondary | ICD-10-CM

## 2020-10-23 DIAGNOSIS — E1169 Type 2 diabetes mellitus with other specified complication: Secondary | ICD-10-CM

## 2020-10-23 NOTE — Chronic Care Management (AMB) (Signed)
Chronic Care Management    Clinical Social Work Note  10/23/2020 Name: David Zamora MRN: [id number] DOB: 1960-12-31  David Zamora is a 60 y.o. year old male who is a primary care patient of Dettinger, Fransisca Kaufmann, MD. The CCM team was consulted to assist the patient with chronic disease management and/or care coordination needs related to: [employer] .   Engaged with patient by telephone for follow up visit in response to provider referral for social work chronic care management and care coordination services.   Consent to Services:  The patient was given information about Chronic Care Management services, agreed to services, and gave verbal consent prior to initiation of services.  Please see initial visit note for detailed documentation.   Patient agreed to services and consent obtained.   Assessment: Review of patient past medical history, allergies, medications, and health status, including review of relevant consultants reports was performed today as part of a comprehensive evaluation and provision of chronic care management and care coordination services.     SDOH (Social Determinants of Health) assessments and interventions performed:  SDOH Interventions    Flowsheet Row Most Recent Value  SDOH Interventions   Depression Interventions/Treatment  Currently on Treatment        Advanced Directives Status: See Vynca application for related entries.  CCM Care Plan  Allergies  Allergen Reactions   Soap     White soaps    Outpatient Encounter Medications as of 10/23/2020  Medication Sig   ATHLETES FOOT SPRAY 1 % AERO APPLY TOPICALLY 2 TIMES A DAY.   blood glucose meter kit and supplies KIT Dispense based on patient and insurance preference. Use up to four times daily as directed. (FOR ICD-9 250.00, 250.01).   buPROPion (WELLBUTRIN XL) 150 MG 24 hr tablet Take 150 mg by mouth every morning.   busPIRone (BUSPAR) 10 MG tablet Take 10 mg by mouth 2 (two) times daily.    Carboxymeth-Glycerin-Polysorb 0.5-1-0.5 % SOLN Apply 1 drop to eye 4 (four) times daily.   Continuous Blood Gluc Receiver (FREESTYLE LIBRE 14 DAY READER) DEVI 1 each by Does not apply route 4 (four) times daily.   Continuous Blood Gluc Sensor (FREESTYLE LIBRE 14 DAY SENSOR) MISC 1 each by Does not apply route every 14 (fourteen) days.   divalproex (DEPAKOTE ER) 250 MG 24 hr tablet Take 250-1,000 mg by mouth See admin instructions. 250mg  in the morning and 1000mg   at bedtime   Emollient (CERAVE) CREA APPLY 1 APPLICATION ONCE A DAY TO BOTH FEET   fluticasone (FLONASE) 50 MCG/ACT nasal spray 1 SPRAY EACH NOSTRIL TWICE DAILY AS NEEDED FOR ALLERGIES OR RHINITIS.   FORA Lancets MISC USE TO TEST BLOOD SUGAR 3 TIMES A DAY USE TO TEST BLOOD SUGAR AS NEEDED FOR SIGNS/SYMPTOMS OF HYPOGLYCEMIA (SHAKINESS, SWEATING, BLURRED VIS   furosemide (LASIX) 20 MG tablet TAKE 1 TABLET BY MOUTH ONCE DAILY AS NEEDED FOR WEIGHT GAIN GREATER THAN 3 LBS   glucose blood (FORA V30A BLOOD GLUCOSE TEST) test strip USE TO TEST BLOOD SUGAR 3 TIMES A DAY AS NEEDED FOR SIGNS/ SYMPTOMS OF HYPOGLYCEMIA SHAKINESS, SWEATING, BLURRED VISION. Dx E11.9   ibuprofen (MOTRIN IB) 200 MG tablet Take 2 tablets (400 mg total) by mouth daily.   insulin aspart (NOVOLOG FLEXPEN) 100 UNIT/ML FlexPen Inject 3-6 Units into the skin 3 (three) times daily with meals. If glucose is above 90 and he is eating.  Follow sliding scale if glucose is above 150 for additional insulin.  If  glucose 151-250 give additional 1 unit for total of 5 units with meal; if glucose 251-350 give additional 2 units for total of 6 units with meal; if glucose 351 or higher give additional 3 units for total of 7 units with meals.   insulin degludec (TRESIBA) 100 UNIT/ML FlexTouch Pen Inject 15 Units into the skin at bedtime.   lisinopril (ZESTRIL) 10 MG tablet TAKE (1) TABLET BY MOUTH ONCE DAILY FOR PROTEINURIA   loperamide (IMODIUM A-D) 2 MG tablet Take 1 tablet (2 mg total) by mouth 4  (four) times daily as needed for diarrhea or loose stools.   meclizine (ANTIVERT) 12.5 MG tablet Take 1 tablet (12.5 mg total) by mouth 3 (three) times daily as needed for dizziness.   omeprazole (PRILOSEC) 20 MG capsule Take 1 capsule (20 mg total) by mouth daily.   PARoxetine (PAXIL) 20 MG tablet Take 20 mg by mouth every morning.   Phenylephrine-DM-GG (TUSSIN CF PO) Take 5 mLs by mouth 4 (four) times daily as needed.   QUEtiapine (SEROQUEL XR) 300 MG 24 hr tablet Take 300 mg by mouth daily. Taken daily at [facility], 1 MG/DOSE, (OZEMPIC, 1 MG/DOSE,) 2 MG/1.5ML SOPN Inject 1 mg into the skin once a week. Start 1 week after you finish 0.74m pen.  Discontinue 0.538mOzempic   simvastatin (ZOCOR) 40 MG tablet One tablet daily   tamsulosin (FLOMAX) 0.4 MG CAPS capsule TAKE 1 CAPSULE BY MOUTH ONCE A DAY.   tolnaftate (TINACTIN) 1 % spray Apply topically 2 (two) times daily.   Transparent Dressings (TEGADERM FILM 6"X8") MISC Use to cover free style libre sensor   triamcinolone ointment (KENALOG) 0.5 % APPLY TOPICALLY TWICE DAILY.   TRUEPLUS PEN NEEDLES 31G X 8 MM MISC USE TO INJECT XULTOPHY DAILY   VASCEPA 1 g capsule TAKE (2) CAPSULES BY MOUTH TWICE DAILY.   Vitamin D, Ergocalciferol, (DRISDOL) 1.25 MG (50000 UNIT) CAPS capsule Take 50,000 Units by mouth once a week.   No facility-administered encounter medications on file as of 10/23/2020.    Patient Active Problem List   Diagnosis Date Noted   Vertigo 08/15/2020   Chronic kidney disease (CKD), stage III (moderate) (HCA[place]02/03/2019   Type 2 diabetes mellitus with hypertriglyceridemia (HCF[place]11/10/2016   Intellectual disability 12/24/2015   Hypertension associated with diabetes (HCM[place]09/02/2016   Depression 12/24/2015   GERD (gastroesophageal reflux disease) 12/24/2015    Conditions to be addressed/monitored: Monitor client management of depression issues   Care Plan : LCSW care plan  Updates made by FoKatha CabalLCSW since  10/23/2020 12:00 AM     Problem: Emotional Distress      Goal: Emotional Health Supported;Manage Depression issues   Start Date: 10/23/2020  Expected End Date: 01/10/2021  This Visit's Progress: On track  Recent Progress: On track  Priority: Medium  Note:   Current Barriers:  Chronic Mental Health needs related to depression issues Pain issues Suicidal Ideation/Homicidal Ideation: No  Clinical Social Work Goal(s):  patient will work with SW monthly by telephone or in person to reduce or manage symptoms related to depression and depression management  Patient will work with SW monthly to discuss pain issues of client and to discuss mobility issues of client   Interventions: 1:1 collaboration with Dettinger, JoFransisca KaufmannMD regarding development and update of comprehensive plan of care as evidenced by provider attestation and co-signature Talked with client about sleeping issues of client Talked with client about weight loss of client( client  said he has lost some weight) Talked with client about ambulation of client Talked with client about pain issues of client  Talked with client about appetite of client Talked with client about upcoming client medical appointments at [facility] Talked with client about relaxation activities (likes to do gardening at [place] Talked with client about socialization with other residents at [facility], Yvone Neu, and LCSW spoke today via phone with David Zamora, Transport planner at [employer] about client recent test at [facility] in [place], [place]  Patient Self Care Activities:  Attends all scheduled provider appointments Performs ADL's independently  Patient Coping Strengths:  Family Friends  Patient Self Care Deficits:  Some Pain issues Mobility issues  Patient Goals:  - spend time or talk with others at least 2 to 3 times per week - practice relaxation or meditation daily - keep a calendar with appointment dates  Follow Up  Plan: LCSW to call client on 11/30/20      [name].Isidora Laham MSW, LCSW Licensed Clinical Social Worker [facility] Care Management [phone]

## 2020-10-23 NOTE — Patient Instructions (Signed)
Visit Information  PATIENT GOALS:  Goals Addressed             This Visit's Progress    Manage My Emotions; Manage Depression issues       Timeframe:  Short-Term Goal Priority:  Medium Progress: On Track Start Date:     10/23/20                        Expected End Date:   01/10/21                    Follow Up Date 11/30/20   {Manage My Emotions (Patient)  ;Manage Depression Issues    Why is this important?   When you are stressed, down or upset, your body reacts too.  For example, your blood pressure may get higher; you may have a headache or stomachache.  When your emotions get the best of you, your body's ability to fight off cold and flu gets weak.  These steps will help you manage your emotions.     Patient Self Care Activities:  Attends all scheduled provider appointments Performs ADL's independently  Patient Coping Strengths:  Family Friends  Patient Self Care Deficits:  Some Pain issues Mobility issues  Patient Goals:  - spend time or talk with others at least 2 to 3 times per week - practice relaxation or meditation daily - keep a calendar with appointment dates  Follow Up Plan: LCSW to call client on 11/30/20     [name].Jess Sulak MSW, LCSW Licensed Clinical Social Worker [facility] Care Management [phone]

## 2020-10-30 ENCOUNTER — Other Ambulatory Visit: Payer: Medicare Other

## 2020-10-30 ENCOUNTER — Other Ambulatory Visit: Payer: Self-pay

## 2020-10-30 DIAGNOSIS — E1122 Type 2 diabetes mellitus with diabetic chronic kidney disease: Secondary | ICD-10-CM | POA: Diagnosis not present

## 2020-10-30 DIAGNOSIS — E1129 Type 2 diabetes mellitus with other diabetic kidney complication: Secondary | ICD-10-CM | POA: Diagnosis not present

## 2020-10-30 DIAGNOSIS — R809 Proteinuria, unspecified: Secondary | ICD-10-CM | POA: Diagnosis not present

## 2020-10-30 DIAGNOSIS — E875 Hyperkalemia: Secondary | ICD-10-CM | POA: Diagnosis not present

## 2020-10-30 DIAGNOSIS — N189 Chronic kidney disease, unspecified: Secondary | ICD-10-CM | POA: Diagnosis not present

## 2020-10-30 DIAGNOSIS — I129 Hypertensive chronic kidney disease with stage 1 through stage 4 chronic kidney disease, or unspecified chronic kidney disease: Secondary | ICD-10-CM | POA: Diagnosis not present

## 2020-11-06 DIAGNOSIS — F319 Bipolar disorder, unspecified: Secondary | ICD-10-CM | POA: Diagnosis not present

## 2020-11-22 IMAGING — DX DG FOREARM 2V*R*
2 series · 2 of 2 positions shown · non-contrast
Comparison: None.

CLINICAL DATA: Psychiatric clearance. Lacerations secondary to
breaking glass.

EXAM:
RIGHT FOREARM - 2 VIEW

[forearm ap]
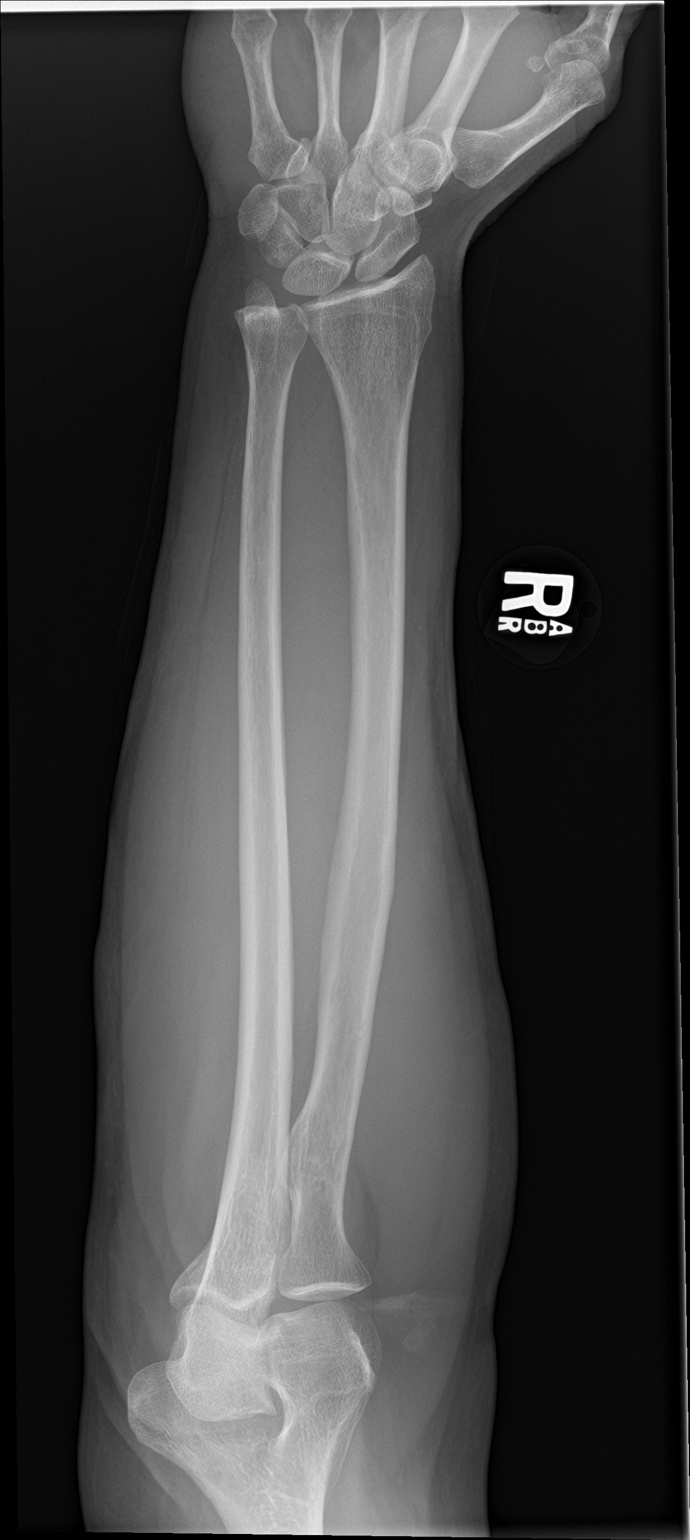

[forearm lat]
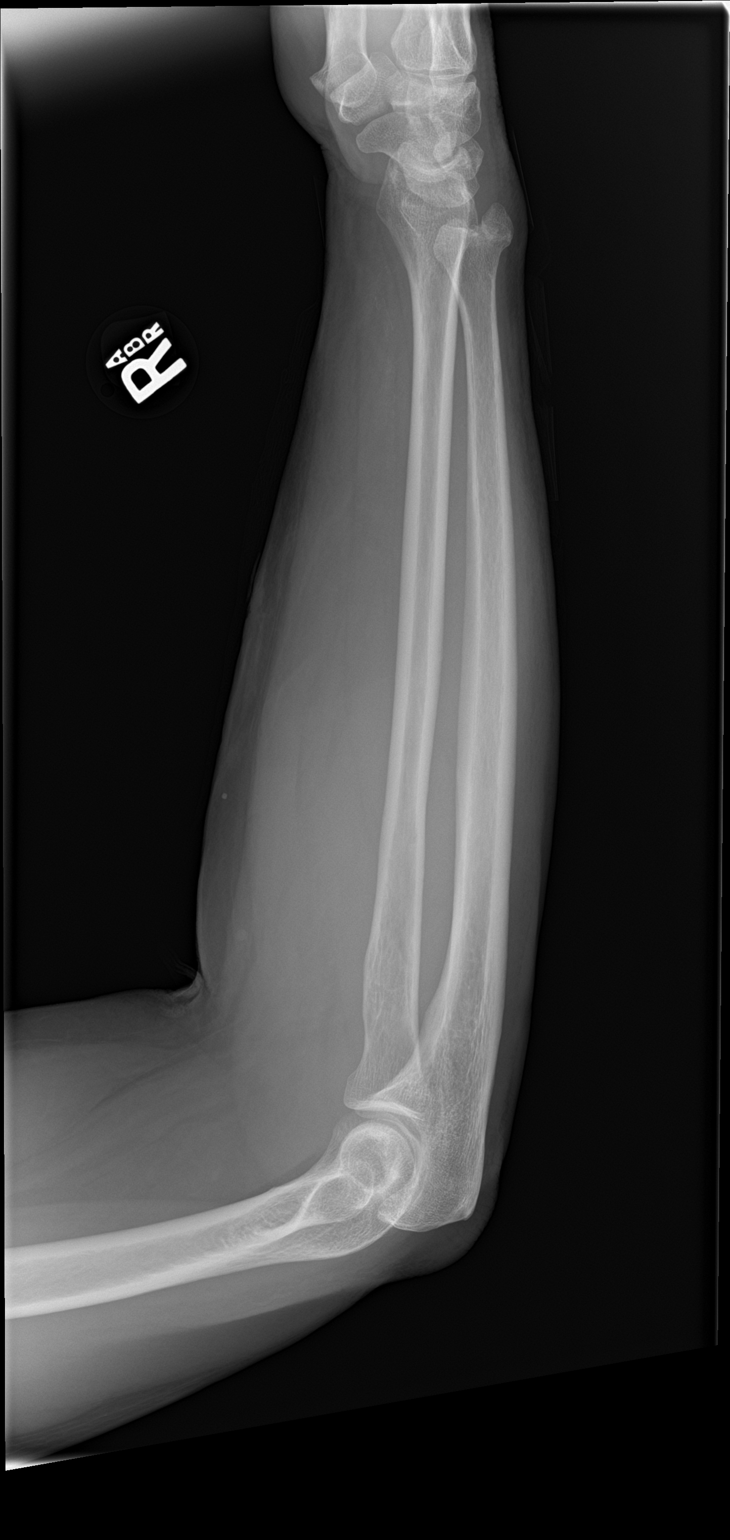

[2 of 2 positions shown; findings below may reference images not displayed]

FINDINGS: No acute fracture. No bone lesion. Prominent ulnar styloid
protruding posteriorly. This may be from an old, healed fracture.

Wrist and elbow joints are normally aligned.

No radiopaque foreign body.
IMPRESSION: 1. No fracture or acute finding.  No radiopaque foreign body.

## 2020-11-22 IMAGING — DX DG CHEST 2V
2 series · 2 of 2 positions shown · non-contrast
Comparison: 06/28/2010

CLINICAL DATA: Psych clearance. Form lacerations after punching a
window.

EXAM:
CHEST - 2 VIEW

[chest pa]
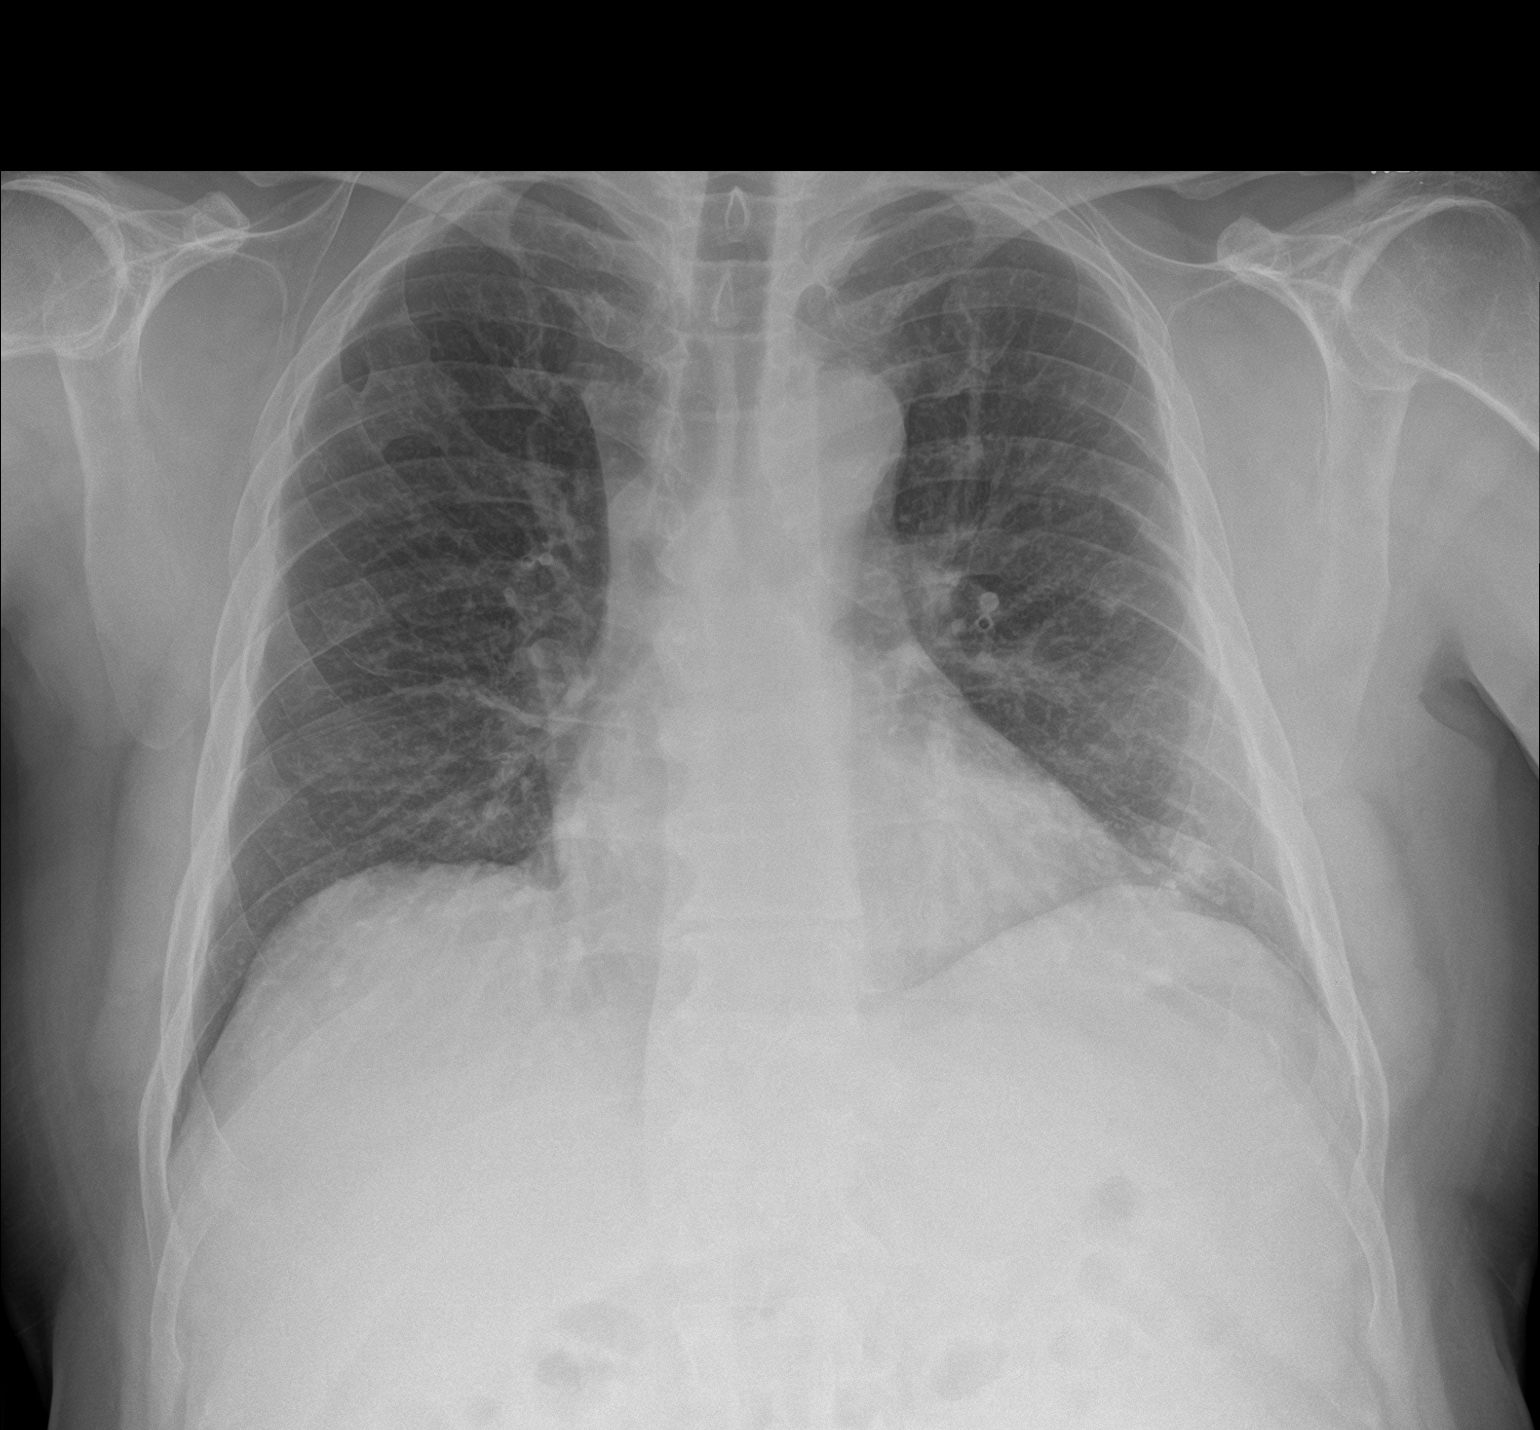

[chest lat]
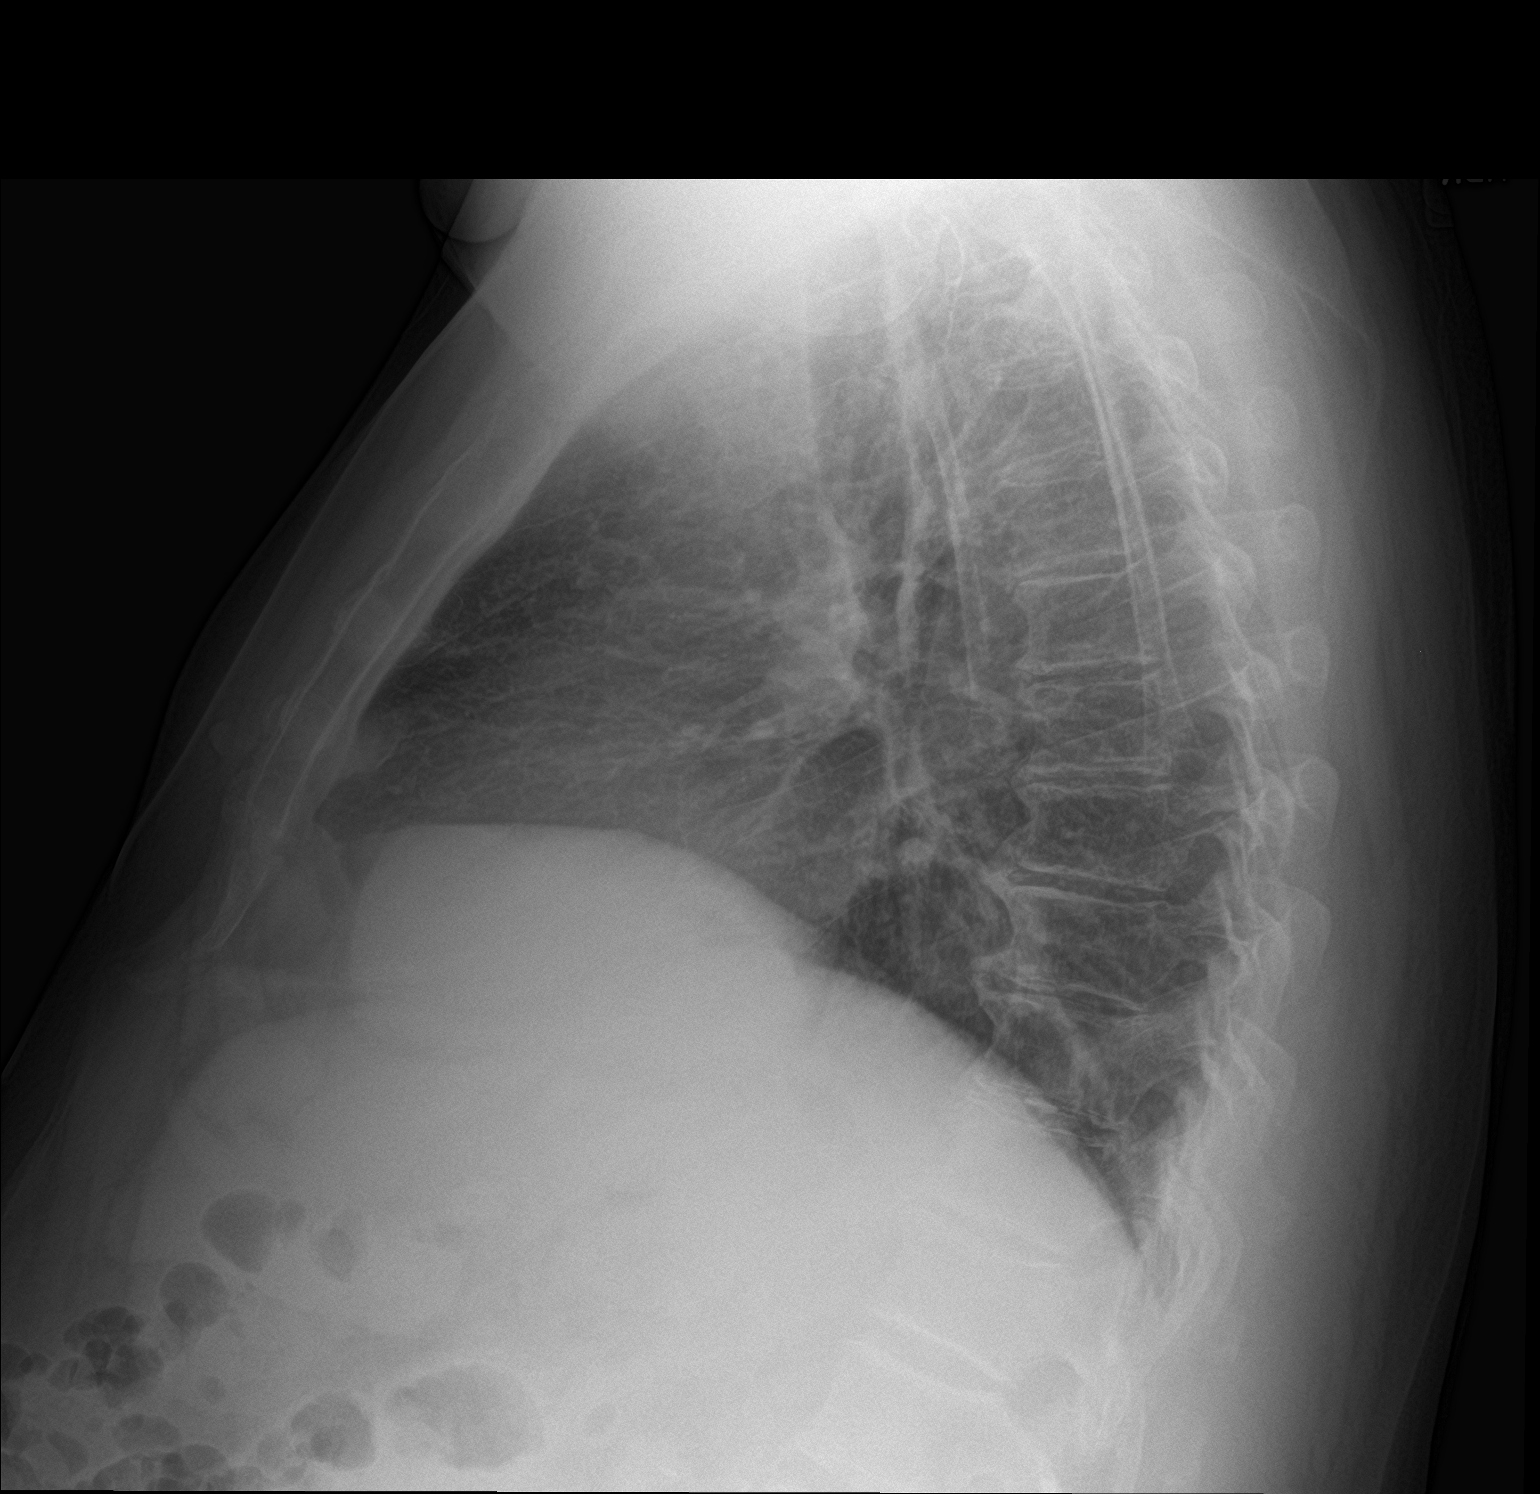

[2 of 2 positions shown; findings below may reference images not displayed]

FINDINGS: The cardiomediastinal silhouette is unchanged with normal heart
size. Mild chronic bronchitic changes are again noted. There is
minimal patchy opacity in the left lung base on the PA radiograph
without a definite infiltrate evident on the lateral radiograph. No
pleural effusion or pneumothorax is identified. No acute osseous
abnormality is seen.
IMPRESSION: Mild chronic bronchitic changes. Minimal left basilar opacity which
may reflect atelectasis or early infiltrate.

## 2020-11-29 ENCOUNTER — Encounter: Payer: Self-pay | Admitting: Family Medicine

## 2020-11-29 ENCOUNTER — Ambulatory Visit (INDEPENDENT_AMBULATORY_CARE_PROVIDER_SITE_OTHER): Payer: Medicare Other | Admitting: Family Medicine

## 2020-11-29 ENCOUNTER — Other Ambulatory Visit: Payer: Self-pay

## 2020-11-29 VITALS — BP 110/78 | HR 83 | Ht 71.0 in | Wt 184.0 lb

## 2020-11-29 DIAGNOSIS — R634 Abnormal weight loss: Secondary | ICD-10-CM

## 2020-11-29 DIAGNOSIS — R399 Unspecified symptoms and signs involving the genitourinary system: Secondary | ICD-10-CM

## 2020-11-29 DIAGNOSIS — K219 Gastro-esophageal reflux disease without esophagitis: Secondary | ICD-10-CM

## 2020-11-29 DIAGNOSIS — Z23 Encounter for immunization: Secondary | ICD-10-CM

## 2020-11-29 LAB — URINALYSIS
Bilirubin, UA: NEGATIVE
Leukocytes,UA: NEGATIVE
Nitrite, UA: NEGATIVE
RBC, UA: NEGATIVE
Specific Gravity, UA: 1.015 (ref 1.005–1.030)
Urobilinogen, Ur: 0.2 mg/dL (ref 0.2–1.0)
pH, UA: 6 (ref 5.0–7.5)

## 2020-11-29 MED ORDER — OMEPRAZOLE 40 MG PO CPDR: 40.0000 mg | DELAYED_RELEASE_CAPSULE | Freq: Every day | ORAL | 3 refills | Status: DC

## 2020-11-29 NOTE — Addendum Note (Signed)
Addended by: Alphonzo Dublin on: 11/29/2020 03:36 PM   Modules accepted: Orders

## 2020-11-29 NOTE — Progress Notes (Signed)
BP 110/78   Pulse 83   Ht '5\' 11"'  (1.803 m)   Wt 184 lb (83.5 kg)   SpO2 99%   BMI 25.66 kg/m    Subjective:   Patient ID: David Zamora, male    DOB: 1960-11-16, 60 y.o.   MRN: [id number]  HPI: David Zamora is a 60 y.o. male presenting on 11/29/2020 for Unable to pass urine   HPI Patient is coming in today with complaints of abdominal discomfort and nausea and intermittent diarrhea.  He says his family is more upset prior to eating on an empty stomach and then once he eats it gets better.  He is also getting diarrhea sometimes in large bowel movements he does eat.  The symptom will go back to normal afterwards.  Patient presents with going on for a little bit he has lost about 10 pounds in the past 2 months and was concerned about this.  He denies any abdominal pain.  Patient complains of dysuria and frequency that is been going on over the past 2 days.  He also gets some burning.  He denies any abdominal pain.  Relevant past medical, surgical, family and social history reviewed and updated as indicated. Interim medical history since our last visit reviewed. Allergies and medications reviewed and updated.  Review of Systems  Constitutional:  Negative for chills and fever.  Respiratory:  Negative for shortness of breath and wheezing.   Cardiovascular:  Negative for chest pain and leg swelling.  Genitourinary:  Positive for dysuria and frequency. Negative for decreased urine volume, difficulty urinating and urgency.  Musculoskeletal:  Negative for back pain and gait problem.  Skin:  Negative for rash.  All other systems reviewed and are negative.  Per HPI unless specifically indicated above   Allergies as of 11/29/2020       Reactions   Soap    White soaps        Medication List        Accurate as of November 29, 2020  3:04 PM. If you have any questions, ask your nurse or doctor.          blood glucose meter kit and supplies Kit Dispense based on patient and  insurance preference. Use up to four times daily as directed. (FOR ICD-9 250.00, 250.01).   buPROPion 150 MG 24 hr tablet Commonly known as: WELLBUTRIN XL Take 150 mg by mouth every morning.   busPIRone 10 MG tablet Commonly known as: BUSPAR Take 10 mg by mouth 2 (two) times daily.   Carboxymeth-Glycerin-Polysorb 0.5-1-0.5 % Soln Apply 1 drop to eye 4 (four) times daily.   CeraVe Crea APPLY 1 APPLICATION ONCE A DAY TO BOTH FEET   divalproex 250 MG 24 hr tablet Commonly known as: DEPAKOTE ER Take 250-1,000 mg by mouth See admin instructions. 267m in the morning and 10033m at bedtime   fluticasone 50 MCG/ACT nasal spray Commonly known as: FLONASE 1 SPRAY EACH NOSTRIL TWICE DAILY AS NEEDED FOR ALLERGIES OR RHINITIS.   FORA Lancets Misc USE TO TEST BLOOD SUGAR 3 TIMES A DAY USE TO TEST BLOOD SUGAR AS NEEDED FOR SIGNS/SYMPTOMS OF HYPOGLYCEMIA (SHAKINESS, SWEATING, BLURRED VIS   FORA V30a Blood Glucose Test test strip Generic drug: glucose blood USE TO TEST BLOOD SUGAR 3 TIMES A DAY AS NEEDED FOR SIGNS/ SYMPTOMS OF HYPOGLYCEMIA SHAKINESS, SWEATING, BLURRED VISION. Dx E11.9   FreeStyle Libre 14 Day Reader DeKerrin Mo each by Does not apply route 4 (four) times daily.  FreeStyle Libre 14 Day Sensor Misc 1 each by Does not apply route every 14 (fourteen) days.   furosemide 20 MG tablet Commonly known as: LASIX TAKE 1 TABLET BY MOUTH ONCE DAILY AS NEEDED FOR WEIGHT GAIN GREATER THAN 3 LBS   ibuprofen 200 MG tablet Commonly known as: Motrin IB Take 2 tablets (400 mg total) by mouth daily.   insulin degludec 100 UNIT/ML FlexTouch Pen Commonly known as: TRESIBA Inject 15 Units into the skin at bedtime.   lisinopril 10 MG tablet Commonly known as: ZESTRIL TAKE (1) TABLET BY MOUTH ONCE DAILY FOR PROTEINURIA   loperamide 2 MG tablet Commonly known as: Imodium A-D Take 1 tablet (2 mg total) by mouth 4 (four) times daily as needed for diarrhea or loose stools.   meclizine 12.5 MG  tablet Commonly known as: ANTIVERT Take 1 tablet (12.5 mg total) by mouth 3 (three) times daily as needed for dizziness.   NovoLOG FlexPen 100 UNIT/ML FlexPen Generic drug: insulin aspart Inject 3-6 Units into the skin 3 (three) times daily with meals. If glucose is above 90 and he is eating.  Follow sliding scale if glucose is above 150 for additional insulin.  If glucose 151-250 give additional 1 unit for total of 5 units with meal; if glucose 251-350 give additional 2 units for total of 6 units with meal; if glucose 351 or higher give additional 3 units for total of 7 units with meals.   omeprazole 40 MG capsule Commonly known as: PRILOSEC Take 1 capsule (40 mg total) by mouth daily. What changed:  medication strength how much to take Changed by: Fransisca Kaufmann Adilee Lemme, MD   Ozempic (1 MG/DOSE) 2 MG/1.5ML Sopn Generic drug: Semaglutide (1 MG/DOSE) Inject 1 mg into the skin once a week. Start 1 week after you finish 0.34m pen.  Discontinue 0.565mOzempic   PARoxetine 20 MG tablet Commonly known as: PAXIL Take 20 mg by mouth every morning.   QUEtiapine 300 MG 24 hr tablet Commonly known as: SEROQUEL XR Take 300 mg by mouth daily. Taken daily at 4pm   simvastatin 40 MG tablet Commonly known as: ZOCOR One tablet daily   tamsulosin 0.4 MG Caps capsule Commonly known as: FLOMAX TAKE 1 CAPSULE BY MOUTH ONCE A DAY.   Tegaderm Film 6"x8" Misc Use to cover free style libre sensor   tolnaftate 1 % spray Commonly known as: TINACTIN Apply topically 2 (two) times daily.   Athletes Foot Spray 1 % Aero Generic drug: Tolnaftate APPLY TOPICALLY 2 TIMES A DAY.   triamcinolone ointment 0.5 % Commonly known as: KENALOG APPLY TOPICALLY TWICE DAILY.   TRUEplus Pen Needles 31G X 8 MM Misc Generic drug: Insulin Pen Needle USE TO INJECT XULTOPHY DAILY   TUSSIN CF PO Take 5 mLs by mouth 4 (four) times daily as needed.   Vascepa 1 g capsule Generic drug: icosapent Ethyl TAKE (2)  CAPSULES BY MOUTH TWICE DAILY.   Vitamin D (Ergocalciferol) 1.25 MG (50000 UNIT) Caps capsule Commonly known as: DRISDOL Take 50,000 Units by mouth once a week.         Objective:   BP 110/78   Pulse 83   Ht '5\' 11"'  (1.803 m)   Wt 184 lb (83.5 kg)   SpO2 99%   BMI 25.66 kg/m   Wt Readings from Last 3 Encounters:  11/29/20 184 lb (83.5 kg)  10/10/20 189 lb (85.7 kg)  09/27/20 194 lb (88 kg)    Physical Exam Vitals and nursing note  reviewed.  Constitutional:      General: He is not in acute distress.    Appearance: He is well-developed. He is not diaphoretic.  Eyes:     General: No scleral icterus.    Conjunctiva/sclera: Conjunctivae normal.  Neck:     Thyroid: No thyromegaly.  Cardiovascular:     Rate and Rhythm: Normal rate and regular rhythm.     Heart sounds: Normal heart sounds. No murmur heard. Pulmonary:     Effort: Pulmonary effort is normal. No respiratory distress.     Breath sounds: Normal breath sounds. No wheezing.  Abdominal:     General: Abdomen is flat. Bowel sounds are normal. There is no distension.     Tenderness: There is no abdominal tenderness. There is no guarding or rebound.  Musculoskeletal:        General: Normal range of motion.     Cervical back: Neck supple.  Lymphadenopathy:     Cervical: No cervical adenopathy.  Skin:    General: Skin is warm and dry.     Findings: No rash.  Neurological:     Mental Status: He is alert and oriented to person, place, and time.     Coordination: Coordination normal.  Psychiatric:        Behavior: Behavior normal.    Urinalysis: 3+ glucose, trace ketones, trace protein, otherwise negative.  Assessment & Plan:   Problem List Items Addressed This Visit       Digestive   GERD (gastroesophageal reflux disease) (Chronic)   Relevant Medications   omeprazole (PRILOSEC) 40 MG capsule   Other Visit Diagnoses     UTI symptoms    -  Primary   Relevant Orders   Urine Culture   Urinalysis    Weight loss, unintentional       Relevant Medications   omeprazole (PRILOSEC) 40 MG capsule   Other Relevant Orders   CBC with Differential/Platelet   CMP14+EGFR   Thyroid Panel With TSH     Urine did not show anything major on the dip, will await culture. Weight loss possibly could be from indigestion and GERD symptoms, will increase the omeprazole to 40 mg daily and see if that helps.  Follow up plan: Return if symptoms worsen or fail to improve.  Counseling provided for all of the vaccine components Orders Placed This Encounter  Procedures   Urine Culture   Urinalysis   CBC with Differential/Platelet   CMP14+EGFR   Thyroid Panel With TSH    Caryl Pina, MD [facility] 11/29/2020, 3:04 PM

## 2020-11-30 ENCOUNTER — Telehealth: Payer: Medicare Other

## 2020-11-30 LAB — CMP14+EGFR
ALT: 11 IU/L (ref 0–44)
AST: 12 IU/L (ref 0–40)
Albumin/Globulin Ratio: 1.9 (ref 1.2–2.2)
Albumin: 4 g/dL (ref 3.8–4.8)
Alkaline Phosphatase: 54 IU/L (ref 44–121)
BUN/Creatinine Ratio: 13 (ref 10–24)
BUN: 29 mg/dL — ABNORMAL HIGH (ref 8–27)
Bilirubin Total: 0.2 mg/dL (ref 0.0–1.2)
CO2: 26 mmol/L (ref 20–29)
Calcium: 9.1 mg/dL (ref 8.6–10.2)
Chloride: 102 mmol/L (ref 96–106)
Creatinine, Ser: 2.26 mg/dL — ABNORMAL HIGH (ref 0.76–1.27)
Globulin, Total: 2.1 g/dL (ref 1.5–4.5)
Glucose: 133 mg/dL — ABNORMAL HIGH (ref 65–99)
Potassium: 5.6 mmol/L — ABNORMAL HIGH (ref 3.5–5.2)
Sodium: 141 mmol/L (ref 134–144)
Total Protein: 6.1 g/dL (ref 6.0–8.5)
eGFR: 32 mL/min/{1.73_m2} — ABNORMAL LOW (ref >59)

## 2020-11-30 LAB — CBC WITH DIFFERENTIAL/PLATELET
Basophils Absolute: 0 10*3/uL (ref 0.0–0.2)
Basos: 0 %
EOS (ABSOLUTE): 0.2 10*3/uL (ref 0.0–0.4)
Eos: 3 %
Hematocrit: 35.5 % — ABNORMAL LOW (ref 37.5–51.0)
Hemoglobin: 12.1 g/dL — ABNORMAL LOW (ref 13.0–17.7)
Immature Grans (Abs): 0.1 10*3/uL (ref 0.0–0.1)
Immature Granulocytes: 1 %
Lymphocytes Absolute: 2.9 10*3/uL (ref 0.7–3.1)
Lymphs: 45 %
MCH: 33 pg (ref 26.6–33.0)
MCHC: 34.1 g/dL (ref 31.5–35.7)
MCV: 97 fL (ref 79–97)
Monocytes Absolute: 0.5 10*3/uL (ref 0.1–0.9)
Monocytes: 7 %
Neutrophils Absolute: 2.8 10*3/uL (ref 1.4–7.0)
Neutrophils: 44 %
Platelets: 238 10*3/uL (ref 150–450)
RBC: 3.67 x10E6/uL — ABNORMAL LOW (ref 4.14–5.80)
RDW: 13.1 % (ref 11.6–15.4)
WBC: 6.5 10*3/uL (ref 3.4–10.8)

## 2020-11-30 LAB — THYROID PANEL WITH TSH
Free Thyroxine Index: 1.1 — ABNORMAL LOW (ref 1.2–4.9)
T3 Uptake Ratio: 26 % (ref 24–39)
T4, Total: 4.3 ug/dL — ABNORMAL LOW (ref 4.5–12.0)
TSH: 3.78 u[IU]/mL (ref 0.450–4.500)

## 2020-12-01 LAB — URINE CULTURE

## 2020-12-06 ENCOUNTER — Other Ambulatory Visit (HOSPITAL_COMMUNITY): Payer: Self-pay | Admitting: Nephrology

## 2020-12-06 ENCOUNTER — Other Ambulatory Visit (HOSPITAL_BASED_OUTPATIENT_CLINIC_OR_DEPARTMENT_OTHER): Payer: Self-pay | Admitting: Nephrology

## 2020-12-06 DIAGNOSIS — E1122 Type 2 diabetes mellitus with diabetic chronic kidney disease: Secondary | ICD-10-CM

## 2020-12-06 DIAGNOSIS — R634 Abnormal weight loss: Secondary | ICD-10-CM

## 2020-12-06 DIAGNOSIS — I129 Hypertensive chronic kidney disease with stage 1 through stage 4 chronic kidney disease, or unspecified chronic kidney disease: Secondary | ICD-10-CM

## 2020-12-06 DIAGNOSIS — D631 Anemia in chronic kidney disease: Secondary | ICD-10-CM

## 2020-12-06 DIAGNOSIS — E1129 Type 2 diabetes mellitus with other diabetic kidney complication: Secondary | ICD-10-CM

## 2020-12-10 ENCOUNTER — Ambulatory Visit (INDEPENDENT_AMBULATORY_CARE_PROVIDER_SITE_OTHER): Payer: Medicare Other | Admitting: Family Medicine

## 2020-12-10 ENCOUNTER — Encounter: Payer: Self-pay | Admitting: Family Medicine

## 2020-12-10 ENCOUNTER — Other Ambulatory Visit: Payer: Self-pay

## 2020-12-10 VITALS — BP 106/72 | HR 88 | Temp 97.5°F | Resp 20 | Ht 71.0 in | Wt 182.0 lb

## 2020-12-10 DIAGNOSIS — D519 Vitamin B12 deficiency anemia, unspecified: Secondary | ICD-10-CM | POA: Diagnosis not present

## 2020-12-10 DIAGNOSIS — I152 Hypertension secondary to endocrine disorders: Secondary | ICD-10-CM

## 2020-12-10 DIAGNOSIS — E1129 Type 2 diabetes mellitus with other diabetic kidney complication: Secondary | ICD-10-CM | POA: Diagnosis not present

## 2020-12-10 DIAGNOSIS — E559 Vitamin D deficiency, unspecified: Secondary | ICD-10-CM | POA: Diagnosis not present

## 2020-12-10 DIAGNOSIS — E1165 Type 2 diabetes mellitus with hyperglycemia: Secondary | ICD-10-CM

## 2020-12-10 DIAGNOSIS — R34 Anuria and oliguria: Secondary | ICD-10-CM

## 2020-12-10 DIAGNOSIS — E1122 Type 2 diabetes mellitus with diabetic chronic kidney disease: Secondary | ICD-10-CM | POA: Diagnosis not present

## 2020-12-10 DIAGNOSIS — D631 Anemia in chronic kidney disease: Secondary | ICD-10-CM | POA: Diagnosis not present

## 2020-12-10 DIAGNOSIS — N183 Chronic kidney disease, stage 3 unspecified: Secondary | ICD-10-CM | POA: Diagnosis not present

## 2020-12-10 DIAGNOSIS — N189 Chronic kidney disease, unspecified: Secondary | ICD-10-CM | POA: Diagnosis not present

## 2020-12-10 DIAGNOSIS — E1169 Type 2 diabetes mellitus with other specified complication: Secondary | ICD-10-CM | POA: Insufficient documentation

## 2020-12-10 DIAGNOSIS — Z794 Long term (current) use of insulin: Secondary | ICD-10-CM | POA: Diagnosis not present

## 2020-12-10 DIAGNOSIS — E785 Hyperlipidemia, unspecified: Secondary | ICD-10-CM

## 2020-12-10 DIAGNOSIS — E875 Hyperkalemia: Secondary | ICD-10-CM | POA: Diagnosis not present

## 2020-12-10 DIAGNOSIS — I129 Hypertensive chronic kidney disease with stage 1 through stage 4 chronic kidney disease, or unspecified chronic kidney disease: Secondary | ICD-10-CM | POA: Diagnosis not present

## 2020-12-10 DIAGNOSIS — Z79899 Other long term (current) drug therapy: Secondary | ICD-10-CM | POA: Diagnosis not present

## 2020-12-10 DIAGNOSIS — E1159 Type 2 diabetes mellitus with other circulatory complications: Secondary | ICD-10-CM

## 2020-12-10 DIAGNOSIS — R809 Proteinuria, unspecified: Secondary | ICD-10-CM | POA: Diagnosis not present

## 2020-12-10 LAB — URINALYSIS, COMPLETE
Bilirubin, UA: NEGATIVE
Leukocytes,UA: NEGATIVE
Nitrite, UA: NEGATIVE
Protein,UA: NEGATIVE
Specific Gravity, UA: 1.01 (ref 1.005–1.030)
Urobilinogen, Ur: 0.2 mg/dL (ref 0.2–1.0)
pH, UA: 5.5 (ref 5.0–7.5)

## 2020-12-10 LAB — MICROSCOPIC EXAMINATION
Bacteria, UA: NONE SEEN
Epithelial Cells (non renal): NONE SEEN /hpf (ref 0–10)
RBC, Urine: NONE SEEN /hpf (ref 0–2)
Renal Epithel, UA: NONE SEEN /hpf
WBC, UA: NONE SEEN /hpf (ref 0–5)

## 2020-12-10 MED ORDER — BLOOD GLUCOSE METER KIT: PACK | 0 refills | Status: AC
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
## 2020-12-10 NOTE — Progress Notes (Signed)
Eye exam results requested from [place]

## 2020-12-11 ENCOUNTER — Other Ambulatory Visit: Payer: Self-pay

## 2020-12-11 ENCOUNTER — Encounter: Payer: Self-pay | Admitting: Family Medicine

## 2020-12-11 ENCOUNTER — Ambulatory Visit
Admission: RE | Admit: 2020-12-11 | Disposition: A | Discharge status: Home / Self Care | Payer: Medicare Other | Source: Ambulatory Visit

## 2020-12-11 VITALS — BP 97/62 | HR 81 | Temp 98.7°F | Resp 18

## 2020-12-11 DIAGNOSIS — E11649 Type 2 diabetes mellitus with hypoglycemia without coma: Secondary | ICD-10-CM

## 2020-12-11 DIAGNOSIS — Z794 Long term (current) use of insulin: Secondary | ICD-10-CM | POA: Diagnosis not present

## 2020-12-11 HISTORY — DX: Type 2 diabetes mellitus without complications: E11.9

## 2020-12-11 LAB — BMP8+EGFR
BUN/Creatinine Ratio: 14 (ref 10–24)
BUN: 29 mg/dL — ABNORMAL HIGH (ref 8–27)
CO2: 24 mmol/L (ref 20–29)
Calcium: 9 mg/dL (ref 8.6–10.2)
Chloride: 107 mmol/L — ABNORMAL HIGH (ref 96–106)
Creatinine, Ser: 2.1 mg/dL — ABNORMAL HIGH (ref 0.76–1.27)
Glucose: 146 mg/dL — ABNORMAL HIGH (ref 65–99)
Potassium: 4.9 mmol/L (ref 3.5–5.2)
Sodium: 143 mmol/L (ref 134–144)
eGFR: 35 mL/min/{1.73_m2} — ABNORMAL LOW (ref >59)

## 2020-12-11 LAB — POCT URINALYSIS DIP (MANUAL ENTRY)
Bilirubin, UA: NEGATIVE
Blood, UA: NEGATIVE
Glucose, UA: >=1000 mg/dL — AB
Ketones, POC UA: NEGATIVE mg/dL
Leukocytes, UA: NEGATIVE
Nitrite, UA: NEGATIVE
Spec Grav, UA: 1.01 (ref 1.010–1.025)
Urobilinogen, UA: 0.2 E.U./dL
pH, UA: 6 (ref 5.0–8.0)

## 2020-12-11 LAB — POCT FASTING CBG KUC MANUAL ENTRY: POCT Glucose (KUC): 66 mg/dL — AB (ref 70–99)

## 2020-12-11 LAB — MICROALBUMIN / CREATININE URINE RATIO
Creatinine, Urine: 90.9 mg/dL
Microalb/Creat Ratio: 97 mg/g creat — ABNORMAL HIGH (ref 0–29)
Microalbumin, Urine: 88.6 ug/mL

## 2020-12-11 NOTE — Discharge Instructions (Addendum)
Follow up with Nephrology and your primary care for evaluation
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
## 2020-12-11 NOTE — ED Triage Notes (Signed)
Pt presents with c/o burning with urination for past couple weeks, pt is in group home setting and had urine tested last week that showed glucose

## 2020-12-12 LAB — URINE CULTURE

## 2020-12-18 ENCOUNTER — Ambulatory Visit (INDEPENDENT_AMBULATORY_CARE_PROVIDER_SITE_OTHER): Payer: Medicare Other | Admitting: Nurse Practitioner

## 2020-12-18 ENCOUNTER — Other Ambulatory Visit: Payer: Self-pay

## 2020-12-18 ENCOUNTER — Encounter: Payer: Self-pay | Admitting: Nurse Practitioner

## 2020-12-18 VITALS — BP 97/64 | HR 75 | Ht 71.0 in | Wt 182.8 lb

## 2020-12-18 DIAGNOSIS — Z794 Long term (current) use of insulin: Secondary | ICD-10-CM | POA: Diagnosis not present

## 2020-12-18 DIAGNOSIS — E782 Mixed hyperlipidemia: Secondary | ICD-10-CM

## 2020-12-18 DIAGNOSIS — N1832 Chronic kidney disease, stage 3b: Secondary | ICD-10-CM

## 2020-12-18 DIAGNOSIS — E1122 Type 2 diabetes mellitus with diabetic chronic kidney disease: Secondary | ICD-10-CM | POA: Diagnosis not present

## 2020-12-18 DIAGNOSIS — E1159 Type 2 diabetes mellitus with other circulatory complications: Secondary | ICD-10-CM | POA: Diagnosis not present

## 2020-12-18 DIAGNOSIS — I152 Hypertension secondary to endocrine disorders: Secondary | ICD-10-CM | POA: Diagnosis not present
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
## 2020-12-20 ENCOUNTER — Ambulatory Visit (HOSPITAL_COMMUNITY): Payer: Medicare Other

## 2020-12-24 ENCOUNTER — Other Ambulatory Visit: Payer: Self-pay

## 2020-12-24 ENCOUNTER — Ambulatory Visit (HOSPITAL_COMMUNITY)
Admission: RE | Admit: 2020-12-24 | Discharge: 2020-12-24 | Disposition: A | Discharge status: Home / Self Care | Payer: Medicare Other | Source: Ambulatory Visit | Attending: Nephrology | Admitting: Nephrology

## 2020-12-24 DIAGNOSIS — I129 Hypertensive chronic kidney disease with stage 1 through stage 4 chronic kidney disease, or unspecified chronic kidney disease: Secondary | ICD-10-CM

## 2020-12-24 DIAGNOSIS — N189 Chronic kidney disease, unspecified: Secondary | ICD-10-CM | POA: Diagnosis not present

## 2020-12-24 DIAGNOSIS — D631 Anemia in chronic kidney disease: Secondary | ICD-10-CM | POA: Diagnosis not present

## 2020-12-24 DIAGNOSIS — E1129 Type 2 diabetes mellitus with other diabetic kidney complication: Secondary | ICD-10-CM | POA: Diagnosis not present

## 2020-12-24 DIAGNOSIS — E1122 Type 2 diabetes mellitus with diabetic chronic kidney disease: Secondary | ICD-10-CM | POA: Diagnosis not present

## 2020-12-24 DIAGNOSIS — R809 Proteinuria, unspecified: Secondary | ICD-10-CM | POA: Insufficient documentation

## 2020-12-24 DIAGNOSIS — R634 Abnormal weight loss: Secondary | ICD-10-CM | POA: Diagnosis not present

## 2020-12-24 DIAGNOSIS — N281 Cyst of kidney, acquired: Secondary | ICD-10-CM | POA: Diagnosis not present

## 2020-12-25 ENCOUNTER — Other Ambulatory Visit: Payer: Self-pay | Admitting: Family Medicine

## 2020-12-26 ENCOUNTER — Other Ambulatory Visit: Payer: Self-pay | Admitting: Family Medicine

## 2020-12-26 DIAGNOSIS — I9589 Other hypotension: Secondary | ICD-10-CM | POA: Diagnosis not present

## 2020-12-26 DIAGNOSIS — N281 Cyst of kidney, acquired: Secondary | ICD-10-CM | POA: Diagnosis not present

## 2020-12-26 DIAGNOSIS — D631 Anemia in chronic kidney disease: Secondary | ICD-10-CM | POA: Diagnosis not present

## 2020-12-26 DIAGNOSIS — N189 Chronic kidney disease, unspecified: Secondary | ICD-10-CM | POA: Diagnosis not present

## 2020-12-26 DIAGNOSIS — R809 Proteinuria, unspecified: Secondary | ICD-10-CM | POA: Diagnosis not present

## 2020-12-26 DIAGNOSIS — E1129 Type 2 diabetes mellitus with other diabetic kidney complication: Secondary | ICD-10-CM | POA: Diagnosis not present

## 2020-12-26 DIAGNOSIS — E1122 Type 2 diabetes mellitus with diabetic chronic kidney disease: Secondary | ICD-10-CM | POA: Diagnosis not present

## 2020-12-26 DIAGNOSIS — E875 Hyperkalemia: Secondary | ICD-10-CM | POA: Diagnosis not present

## 2020-12-27 ENCOUNTER — Other Ambulatory Visit: Payer: Self-pay | Admitting: "Endocrinology

## 2020-12-28 ENCOUNTER — Ambulatory Visit (INDEPENDENT_AMBULATORY_CARE_PROVIDER_SITE_OTHER): Payer: Medicare Other | Admitting: Family Medicine

## 2020-12-28 ENCOUNTER — Other Ambulatory Visit: Payer: Self-pay

## 2020-12-28 ENCOUNTER — Encounter: Payer: Self-pay | Admitting: Family Medicine

## 2020-12-28 VITALS — BP 93/65 | HR 94 | Temp 97.1°F | Ht 71.0 in | Wt 180.5 lb

## 2020-12-28 DIAGNOSIS — Z23 Encounter for immunization: Secondary | ICD-10-CM | POA: Diagnosis not present

## 2020-12-28 DIAGNOSIS — Z794 Long term (current) use of insulin: Secondary | ICD-10-CM

## 2020-12-28 DIAGNOSIS — E785 Hyperlipidemia, unspecified: Secondary | ICD-10-CM

## 2020-12-28 DIAGNOSIS — E1169 Type 2 diabetes mellitus with other specified complication: Secondary | ICD-10-CM

## 2020-12-28 DIAGNOSIS — E1165 Type 2 diabetes mellitus with hyperglycemia: Secondary | ICD-10-CM | POA: Diagnosis not present

## 2020-12-28 DIAGNOSIS — R251 Tremor, unspecified: Secondary | ICD-10-CM | POA: Diagnosis not present

## 2020-12-28 DIAGNOSIS — I152 Hypertension secondary to endocrine disorders: Secondary | ICD-10-CM

## 2020-12-28 DIAGNOSIS — E1159 Type 2 diabetes mellitus with other circulatory complications: Secondary | ICD-10-CM | POA: Diagnosis not present

## 2020-12-28 DIAGNOSIS — E1122 Type 2 diabetes mellitus with diabetic chronic kidney disease: Secondary | ICD-10-CM

## 2020-12-28 DIAGNOSIS — N183 Chronic kidney disease, stage 3 unspecified: Secondary | ICD-10-CM | POA: Diagnosis not present

## 2020-12-28 LAB — BAYER DCA HB A1C WAIVED: HB A1C (BAYER DCA - WAIVED): 5.7 % — ABNORMAL HIGH (ref 4.8–5.6)

## 2020-12-28 NOTE — Progress Notes (Signed)
BP 93/65   Pulse 94   Temp (!) 97.1 F (36.2 C) (Temporal)   Ht _0  (1.803 m)   Wt 180 lb 8 oz (81.9 kg)   BMI 25.17 kg/m    Subjective:   Patient ID: David Zamora, male    DOB: 1961/02/26, 60 y.o.   MRN: [id number]  HPI: David Zamora is a 60 y.o. male presenting on 12/28/2020 for Medical Management of Chronic Issues, Diabetes, and Hyperlipidemia   HPI Type 2 diabetes mellitus Patient comes in today for recheck of his diabetes. Patient has been currently taking NovoLog and Ozempic and [place]. Patient is currently on an ACE inhibitor/ARB. Patient has seen an ophthalmologist this year. Patient Denies any issues with their feet. The symptom started onset as an adult hypertension and hyperlipidemia and CKD ARE RELATED TO DM   Hypertension Patient is currently on lisinopril, and their blood pressure today is 93/65. Patient denies any lightheadedness or dizziness. Patient denies headaches, blurred vision, chest pains, shortness of breath, or weakness. Denies any side effects from medication and is content with current medication.   Hyperlipidemia Patient is coming in for recheck of his hyperlipidemia. The patient is currently taking Vascepa and simvastatin. They deny any issues with myalgias or history of liver damage from it. They deny any focal numbness or weakness or chest pain.   Patient is coming in complaining of tremors that are worsening.  It looks like he did see Dr. Phillips Odor last year and is working with his psychiatrist, recommended that he go back to his neurologist and keep working with his psychiatry on the medicines as well.  Relevant past medical, surgical, family and social history reviewed and updated as indicated. Interim medical history since our last visit reviewed. Allergies and medications reviewed and updated.  Review of Systems  Constitutional:  Negative for chills and fever.  Eyes:  Negative for visual disturbance.  Respiratory:  Negative for shortness of  breath and wheezing.   Cardiovascular:  Negative for chest pain and leg swelling.  Musculoskeletal:  Negative for back pain and gait problem.  Skin:  Negative for rash.  Neurological:  Positive for tremors. Negative for dizziness.  All other systems reviewed and are negative.  Per HPI unless specifically indicated above   Allergies as of 12/28/2020       Reactions   Soap    White soaps        Medication List        Accurate as of December 28, 2020  1:59 PM. If you have any questions, ask your nurse or doctor.          blood glucose meter kit and supplies Dispense based on patient and insurance preference. Use up to four times daily as directed. (FOR ICD-10 E10.9, E11.9).   blood glucose meter kit and supplies Kit Dispense based on patient and insurance preference. Use up to four times daily as directed. (FOR ICD-9 250.00, 250.01).   buPROPion 150 MG 24 hr tablet Commonly known as: WELLBUTRIN XL Take 150 mg by mouth every morning.   busPIRone 10 MG tablet Commonly known as: BUSPAR Take 10 mg by mouth 2 (two) times daily.   Carboxymeth-Glycerin-Polysorb 0.5-1-0.5 % Soln Apply 1 drop to eye 4 (four) times daily.   CeraVe Crea APPLY 1 APPLICATION ONCE A DAY TO BOTH FEET   divalproex 250 MG 24 hr tablet Commonly known as: DEPAKOTE ER Take 250-1,000 mg by mouth See admin instructions. 269m in the morning  and 1021m  at bedtime   fluticasone 50 MCG/ACT nasal spray Commonly known as: FLONASE 1 SPRAY EACH NOSTRIL TWICE DAILY AS NEEDED FOR ALLERGIES OR RHINITIS.   FORA Lancets Misc USE TO TEST BLOOD SUGAR 3 TIMES A DAY USE TO TEST BLOOD SUGAR AS NEEDED FOR SIGNS/SYMPTOMS OF HYPOGLYCEMIA (SHAKINESS, SWEATING, BLURRED VIS   FORA V30a Blood Glucose Test test strip Generic drug: glucose blood USE TO TEST BLOOD SUGAR 3 TIMES A DAY AS NEEDED FOR SIGNS/ SYMPTOMS OF HYPOGLYCEMIA SHAKINESS, SWEATING, BLURRED VISION. Dx E11.9   furosemide 20 MG tablet Commonly known as:  LASIX TAKE 1 TABLET BY MOUTH ONCE DAILY AS NEEDED FOR WEIGHT GAIN GREATER THAN 3 LBS   ibuprofen 200 MG tablet Commonly known as: Motrin IB Take 2 tablets (400 mg total) by mouth daily.   insulin degludec 100 UNIT/ML FlexTouch Pen Commonly known as: TRESIBA Inject 15 Units into the skin at bedtime.   TTyler AasFlexTouch 100 UNIT/ML FlexTouch Pen Generic drug: insulin degludec Inject into the skin.   lisinopril 10 MG tablet Commonly known as: ZESTRIL TAKE (1) TABLET BY MOUTH ONCE DAILY FOR PROTEINURIA What changed: Another medication with the same name was removed. Continue taking this medication, and follow the directions you see here. Changed by: JWorthy Rancher MD   loperamide 2 MG tablet Commonly known as: Imodium A-D Take 1 tablet (2 mg total) by mouth 4 (four) times daily as needed for diarrhea or loose stools.   meclizine 12.5 MG tablet Commonly known as: ANTIVERT Take 1 tablet (12.5 mg total) by mouth 3 (three) times daily as needed for dizziness.   NovoLOG FlexPen 100 UNIT/ML FlexPen Generic drug: insulin aspart Inject 3-6 Units into the skin 3 (three) times daily with meals. If glucose is above 90 and he is eating.  Follow sliding scale if glucose is above 150 for additional insulin.  If glucose 151-250 give additional 1 unit for total of 5 units with meal; if glucose 251-350 give additional 2 units for total of 6 units with meal; if glucose 351 or higher give additional 3 units for total of 7 units with meals. What changed: Another medication with the same name was removed. Continue taking this medication, and follow the directions you see here. Changed by: JFransisca KaufmannDettinger, MD   omeprazole 40 MG capsule Commonly known as: PRILOSEC Take 1 capsule (40 mg total) by mouth daily. What changed: Another medication with the same name was removed. Continue taking this medication, and follow the directions you see here. Changed by: JFransisca KaufmannDettinger, MD   Ozempic (1  MG/DOSE) 2 MG/1.5ML Sopn Generic drug: Semaglutide (1 MG/DOSE) Inject 1 mg into the skin once a week. Start 1 week after you finish 0.510mpen.  Discontinue 0.49m749mzempic What changed: Another medication with the same name was removed. Continue taking this medication, and follow the directions you see here. Changed by: JosFransisca Kaufmannttinger, MD   PARoxetine 20 MG tablet Commonly known as: PAXIL Take 20 mg by mouth daily. What changed: Another medication with the same name was removed. Continue taking this medication, and follow the directions you see here. Changed by: JosFransisca Kaufmannttinger, MD   QUEtiapine 300 MG 24 hr tablet Commonly known as: SEROQUEL XR Take 300 mg by mouth daily. Taken daily at 4pm   QUEtiapine 50 MG Tb24 24 hr tablet Commonly known as: SEROQUEL XR Take by mouth.   simvastatin 40 MG tablet Commonly known as: ZOCOR One tablet daily What changed: Another  medication with the same name was removed. Continue taking this medication, and follow the directions you see here. Changed by: Fransisca Kaufmann Shaman Muscarella, MD   tamsulosin 0.4 MG Caps capsule Commonly known as: FLOMAX TAKE 1 CAPSULE BY MOUTH ONCE A DAY.   Tegaderm Film 6"x8" Misc Use to cover free style libre sensor   tolnaftate 1 % spray Commonly known as: TINACTIN Apply topically 2 (two) times daily.   Athletes Foot Spray 1 % Aero Generic drug: Tolnaftate APPLY TOPICALLY 2 TIMES A DAY.   triamcinolone ointment 0.5 % Commonly known as: KENALOG APPLY TOPICALLY TWICE DAILY.   TRUEplus Pen Needles 31G X 8 MM Misc Generic drug: Insulin Pen Needle USE TO INJECT XULTOPHY DAILY   TUSSIN CF PO Take 5 mLs by mouth 4 (four) times daily as needed.   Vascepa 1 g capsule Generic drug: icosapent Ethyl TAKE (2) CAPSULES BY MOUTH TWICE DAILY.   Vitamin D (Ergocalciferol) 1.25 MG (50000 UNIT) Caps capsule Commonly known as: DRISDOL Take 50,000 Units by mouth once a week.         Objective:   BP 93/65   Pulse 94    Temp (!) 97.1 F (36.2 C) (Temporal)   Ht _0  (1.803 m)   Wt 180 lb 8 oz (81.9 kg)   BMI 25.17 kg/m   Wt Readings from Last 3 Encounters:  12/28/20 180 lb 8 oz (81.9 kg)  12/18/20 182 lb 12.8 oz (82.9 kg)  12/10/20 182 lb (82.6 kg)    Physical Exam Vitals and nursing note reviewed.  Constitutional:      General: He is not in acute distress.    Appearance: He is well-developed. He is not diaphoretic.  Eyes:     General: No scleral icterus.    Conjunctiva/sclera: Conjunctivae normal.  Neck:     Thyroid: No thyromegaly.  Cardiovascular:     Rate and Rhythm: Normal rate and regular rhythm.     Heart sounds: Normal heart sounds. No murmur heard. Pulmonary:     Effort: Pulmonary effort is normal. No respiratory distress.     Breath sounds: Normal breath sounds. No wheezing.  Musculoskeletal:        General: Normal range of motion.     Cervical back: Neck supple.  Lymphadenopathy:     Cervical: No cervical adenopathy.  Skin:    General: Skin is warm and dry.     Findings: No rash.  Neurological:     Mental Status: He is alert and oriented to person, place, and time.     Coordination: Coordination normal.  Psychiatric:        Behavior: Behavior normal.      Assessment & Plan:   Problem List Items Addressed This Visit       Cardiovascular and Mediastinum   Hypertension associated with diabetes ([place])     Endocrine   Controlled type 2 diabetes mellitus with stage 3 chronic kidney disease, with long-term current use of insulin (HCC)   CKD stage 3 due to type 2 diabetes mellitus (HCC)   Hyperlipidemia associated with type 2 diabetes mellitus ([place])   Other Visit Diagnoses     Type 2 diabetes mellitus with hyperglycemia, with long-term current use of insulin (HCC)    -  Primary   Relevant Orders   Bayer DCA Hb A1c Waived   Tremor         Will give patient the number for Dr. Doristine Devoid office again  Follow up plan: Return in about  3 months (around 03/29/2021), or  if symptoms worsen or fail to improve, for htn and diabetes and ckd.  Counseling provided for all of the vaccine components Orders Placed This Encounter  Procedures   Bayer [place] Hb A1c [place] Faithlyn Recktenwald, MD [facility] 12/28/2020, 1:59 PM

## 2021-01-06 ENCOUNTER — Emergency Department (HOSPITAL_COMMUNITY)
Admission: EM | Admit: 2021-01-06 | Discharge: 2021-01-06 | Disposition: A | Discharge status: Home / Self Care | Payer: Medicare Other | Attending: Emergency Medicine | Admitting: Emergency Medicine

## 2021-01-06 ENCOUNTER — Encounter (HOSPITAL_COMMUNITY): Payer: Self-pay | Admitting: Emergency Medicine

## 2021-01-06 DIAGNOSIS — Z79899 Other long term (current) drug therapy: Secondary | ICD-10-CM | POA: Diagnosis not present

## 2021-01-06 DIAGNOSIS — I129 Hypertensive chronic kidney disease with stage 1 through stage 4 chronic kidney disease, or unspecified chronic kidney disease: Secondary | ICD-10-CM | POA: Diagnosis not present

## 2021-01-06 DIAGNOSIS — R4182 Altered mental status, unspecified: Secondary | ICD-10-CM | POA: Diagnosis present

## 2021-01-06 DIAGNOSIS — X789XXA Intentional self-harm by unspecified sharp object, initial encounter: Secondary | ICD-10-CM | POA: Diagnosis not present

## 2021-01-06 DIAGNOSIS — Z794 Long term (current) use of insulin: Secondary | ICD-10-CM | POA: Insufficient documentation

## 2021-01-06 DIAGNOSIS — Z87891 Personal history of nicotine dependence: Secondary | ICD-10-CM | POA: Diagnosis not present

## 2021-01-06 DIAGNOSIS — F70 Mild intellectual disabilities: Secondary | ICD-10-CM | POA: Insufficient documentation

## 2021-01-06 DIAGNOSIS — N183 Chronic kidney disease, stage 3 unspecified: Secondary | ICD-10-CM | POA: Insufficient documentation

## 2021-01-06 DIAGNOSIS — F419 Anxiety disorder, unspecified: Secondary | ICD-10-CM

## 2021-01-06 DIAGNOSIS — Y900 Blood alcohol level of less than 20 mg/100 ml: Secondary | ICD-10-CM | POA: Diagnosis not present

## 2021-01-06 DIAGNOSIS — E1122 Type 2 diabetes mellitus with diabetic chronic kidney disease: Secondary | ICD-10-CM | POA: Insufficient documentation

## 2021-01-06 DIAGNOSIS — S41119A Laceration without foreign body of unspecified upper arm, initial encounter: Secondary | ICD-10-CM | POA: Insufficient documentation

## 2021-01-06 LAB — CBC
HCT: 38.3 % — ABNORMAL LOW (ref 39.0–52.0)
Hemoglobin: 12.9 g/dL — ABNORMAL LOW (ref 13.0–17.0)
MCH: 34.2 pg — ABNORMAL HIGH (ref 26.0–34.0)
MCHC: 33.7 g/dL (ref 30.0–36.0)
MCV: 101.6 fL — ABNORMAL HIGH (ref 80.0–100.0)
Platelets: 229 10*3/uL (ref 150–400)
RBC: 3.77 MIL/uL — ABNORMAL LOW (ref 4.22–5.81)
RDW: 12.5 % (ref 11.5–15.5)
WBC: 6.4 10*3/uL (ref 4.0–10.5)
nRBC: 0 % (ref 0.0–0.2)

## 2021-01-06 LAB — COMPREHENSIVE METABOLIC PANEL
ALT: 15 U/L (ref 0–44)
AST: 20 U/L (ref 15–41)
Albumin: 3.8 g/dL (ref 3.5–5.0)
Alkaline Phosphatase: 48 U/L (ref 38–126)
Anion gap: 11 (ref 5–15)
BUN: 39 mg/dL — ABNORMAL HIGH (ref 8–23)
CO2: 23 mmol/L (ref 22–32)
Calcium: 8.7 mg/dL — ABNORMAL LOW (ref 8.9–10.3)
Chloride: 103 mmol/L (ref 98–111)
Creatinine, Ser: 2.58 mg/dL — ABNORMAL HIGH (ref 0.61–1.24)
GFR, Estimated: 27 mL/min — ABNORMAL LOW (ref >60)
Glucose, Bld: 205 mg/dL — ABNORMAL HIGH (ref 70–99)
Potassium: 5.5 mmol/L — ABNORMAL HIGH (ref 3.5–5.1)
Sodium: 137 mmol/L (ref 135–145)
Total Bilirubin: 0.4 mg/dL (ref 0.3–1.2)
Total Protein: 6.8 g/dL (ref 6.5–8.1)

## 2021-01-06 LAB — ETHANOL: Alcohol, Ethyl (B): <10 mg/dL (ref <10)

## 2021-01-06 LAB — POTASSIUM: Potassium: 5.3 mmol/L — ABNORMAL HIGH (ref 3.5–5.1)

## 2021-01-06 LAB — ACETAMINOPHEN LEVEL: Acetaminophen (Tylenol), Serum: <10 ug/mL — ABNORMAL LOW (ref 10–30)

## 2021-01-06 LAB — SALICYLATE LEVEL: Salicylate Lvl: <7 mg/dL — ABNORMAL LOW (ref 7.0–30.0)

## 2021-01-06 NOTE — ED Provider Notes (Signed)
[facility] EMERGENCY DEPARTMENT Provider Note   CSN: [id number] Arrival date & time: 01/06/21  1437     History Chief Complaint  Patient presents with   Medical Clearance    David Zamora is a 60 y.o. male.  Patient has a history of decreased mental capacity.  He got away from his group home and was found trying to cut himself.  This is happened several times.  He states he does not want to kill himself  The history is provided by the patient and medical records. No language interpreter was used.  Altered Mental Status Presenting symptoms: behavior changes   Severity:  Moderate Most recent episode:  More than 2 days ago Episode history:  Continuous Timing:  Constant Progression:  Waxing and waning Chronicity:  Recurrent Context: not alcohol use   Associated symptoms: agitation   Associated symptoms: no abdominal pain, no hallucinations, no headaches, no rash and no seizures       Past Medical History:  Diagnosis Date   Depression    Diabetes mellitus    Diabetes mellitus without complication (HCC)    GERD (gastroesophageal reflux disease)    Hypertension    Mental retardation    Renal disorder     Patient Active Problem List   Diagnosis Date Noted   Hyperlipidemia associated with type 2 diabetes mellitus ([place]) 12/10/2020   Vertigo 08/15/2020   Anemia of chronic renal failure 12/04/2018   CKD stage 3 due to type 2 diabetes mellitus ([place]) 05/26/2018   Hyperkalemia 12/24/2015   Intellectual disability 12/24/2015   Hypertension associated with diabetes ([place]) 12/24/2015   Depressive disorder 12/24/2015   Gastroesophageal reflux disease 12/24/2015   Controlled type 2 diabetes mellitus with stage 3 chronic kidney disease, with long-term current use of insulin ([place]) 12/24/2015    Past Surgical History:  Procedure Laterality Date   CYST REMOVAL TRUNK     FINGER FRACTURE SURGERY  02/22/2013       Family History  Problem Relation Age of Onset   COPD Mother     Diabetes Mother    Heart disease Mother    Early death Father    Heart attack Father    Heart disease Father     Social History   Tobacco Use   Smoking status: Former    Types: Cigarettes   Smokeless tobacco: Never  Vaping Use   Vaping Use: Never used  Substance Use Topics   Alcohol use: Not Currently    Comment: Denies   Drug use: No    Comment: Denies     Home Medications Prior to Admission medications   Medication Sig Start Date End Date Taking? Authorizing Provider  ATHLETES FOOT SPRAY 1 % AERO APPLY TOPICALLY 2 TIMES A DAY. 05/08/20   Dettinger, Fransisca Kaufmann, MD  blood glucose meter kit and supplies KIT Dispense based on patient and insurance preference. Use up to four times daily as directed. (FOR ICD-9 250.00, 250.01). 06/18/16   Dettinger, Fransisca Kaufmann, MD  blood glucose meter kit and supplies Dispense based on patient and insurance preference. Use up to four times daily as directed. (FOR ICD-10 E10.9, E11.9). 12/10/20   Baruch Gouty, FNP  buPROPion (WELLBUTRIN XL) 150 MG 24 hr tablet Take 150 mg by mouth every morning.    [provider]  busPIRone (BUSPAR) 10 MG tablet Take 10 mg by mouth 2 (two) times daily.    [provider]  Carboxymeth-Glycerin-Polysorb 0.5-1-0.5 % SOLN Apply 1 drop to eye  4 (four) times daily.    [provider]  divalproex (DEPAKOTE ER) 250 MG 24 hr tablet Take 250-1,000 mg by mouth See admin instructions. 271m in the morning and 10018m at bedtime    [provider]  Emollient (CERAVE) CREA APPLY 1 APPLICATION ONCE A DAY TO BOTH FEET 08/30/20   Dettinger, JoFransisca KaufmannMD  fluticasone (FLONASE) 50 MCG/ACT nasal spray 1 SPRAY EACH NOSTRIL TWICE DAILY AS NEEDED FOR ALLERGIES OR RHINITIS. 03/26/20   Dettinger, JoFransisca KaufmannMD  FORA Lancets MISC USE TO TEST BLOOD SUGAR 3 TIMES A DAY USE TO TEST BLOOD SUGAR AS NEEDED FOR SIGNS/SYMPTOMS OF HYPOGLYCEMIA (SHAKINESS, SWEATING, BLURRED VIS 09/28/18   Dettinger, JoFransisca KaufmannMD  furosemide  (LASIX) 20 MG tablet TAKE 1 TABLET BY MOUTH ONCE DAILY AS NEEDED FOR WEIGHT GAIN GREATER THAN 3 LBS 03/26/20   Dettinger, JoFransisca KaufmannMD  glucose blood (FORA V30A BLOOD GLUCOSE TEST) test strip USE TO TEST BLOOD SUGAR 3 TIMES A DAY AS NEEDED FOR SIGNS/ SYMPTOMS OF HYPOGLYCEMIA SHAKINESS, SWEATING, BLURRED VISION. Dx E11.9 11/10/18   Dettinger, JoFransisca KaufmannMD  ibuprofen (MOTRIN IB) 200 MG tablet Take 2 tablets (400 mg total) by mouth daily. 09/22/19   Dettinger, JoFransisca KaufmannMD  insulin aspart (NOVOLOG FLEXPEN) 100 UNIT/ML FlexPen Inject 3-6 Units into the skin 3 (three) times daily with meals. If glucose is above 90 and he is eating.  Follow sliding scale if glucose is above 150 for additional insulin.  If glucose 151-250 give additional 1 unit for total of 5 units with meal; if glucose 251-350 give additional 2 units for total of 6 units with meal; if glucose 351 or higher give additional 3 units for total of 7 units with meals. 10/10/20   ReBrita RompNP  insulin degludec (TRESIBA) 100 UNIT/ML FlexTouch Pen Inject 15 Units into the skin at bedtime. 07/17/20   ReBrita RompNP  lisinopril (ZESTRIL) 10 MG tablet TAKE (1) TABLET BY MOUTH ONCE DAILY FOR PROTEINURIA 03/26/20   Dettinger, JoFransisca KaufmannMD  loperamide (IMODIUM A-D) 2 MG tablet Take 1 tablet (2 mg total) by mouth 4 (four) times daily as needed for diarrhea or loose stools. 05/07/20   Dettinger, JoFransisca KaufmannMD  meclizine (ANTIVERT) 12.5 MG tablet Take 1 tablet (12.5 mg total) by mouth 3 (three) times daily as needed for dizziness. 08/15/20   IjIvy LynnNP  omeprazole (PRILOSEC) 40 MG capsule Take 1 capsule (40 mg total) by mouth daily. 11/29/20   Dettinger, JoFransisca KaufmannMD  PARoxetine (PAXIL) 20 MG tablet Take 20 mg by mouth daily. 11/13/20   [provider]  Phenylephrine-DM-GG (TUSSIN CF PO) Take 5 mLs by mouth 4 (four) times daily as needed.    [provider]  QUEtiapine (SEROQUEL XR) 300 MG 24 hr tablet Take 300 mg by mouth  daily. Taken daily at 4pPulte HomesHistorical, MD  QUEtiapine (SEROQUEL XR) 50 MG TB24 24 hr tablet Take by mouth. 11/13/20   [provider]  Semaglutide, 1 MG/DOSE, (OZEMPIC, 1 MG/DOSE,) 2 MG/1.5ML SOPN Inject 1 mg into the skin once a week. Start 1 week after you finish 0.42m41men.  Discontinue 0.42mg66mempic 04/09/20   Dettinger, JoshFransisca Kaufmann  simvastatin (ZOCOR) 40 MG tablet One tablet daily 03/26/20   Dettinger, JoshFransisca Kaufmann  tamsulosin (FLOMAX) 0.4 MG CAPS capsule TAKE 1 CAPSULE BY MOUTH ONCE A DAY. 06/13/20   Dettinger, JoshFransisca Kaufmann  tolnaftate (  TINACTIN) 1 % spray Apply topically 2 (two) times daily. 11/29/18   Dettinger, Fransisca Kaufmann, MD  Transparent Dressings (TEGADERM FILM [phone]") MISC Use to cover free style libre sensor 09/28/19   Dettinger, Fransisca Kaufmann, MD  TRESIBA FLEXTOUCH 100 UNIT/ML FlexTouch Pen Inject into the skin. 11/13/20   [provider]  triamcinolone ointment (KENALOG) 0.5 % APPLY TOPICALLY TWICE DAILY. 08/29/19   Dettinger, Fransisca Kaufmann, MD  TRUEPLUS PEN NEEDLES 31G X 8 MM MISC USE TO INJECT XULTOPHY DAILY 01/19/20   Dettinger, Fransisca Kaufmann, MD  VASCEPA 1 g capsule TAKE (2) CAPSULES BY MOUTH TWICE DAILY. 05/30/19   Dettinger, Fransisca Kaufmann, MD  Vitamin D, Ergocalciferol, (DRISDOL) 1.25 MG (50000 UNIT) CAPS capsule Take 50,000 Units by mouth once a week. 06/12/20   [provider]    Allergies    Soap  Review of Systems   Review of Systems  Constitutional:  Negative for appetite change and fatigue.  HENT:  Negative for congestion, ear discharge and sinus pressure.   Eyes:  Negative for discharge.  Respiratory:  Negative for cough.   Cardiovascular:  Negative for chest pain.  Gastrointestinal:  Negative for abdominal pain and diarrhea.  Genitourinary:  Negative for frequency and hematuria.  Musculoskeletal:  Negative for back pain.  Skin:  Negative for rash.  Neurological:  Negative for seizures and headaches.  Psychiatric/Behavioral:  Positive for agitation.  Negative for hallucinations.    Physical Exam Updated Vital Signs BP 108/67 (BP Location: Left Arm)   Pulse 89   Temp 98.2 F (36.8 C) (Oral)   Resp 20   Ht _0  (1.803 m)   Wt 85.9 kg   SpO2 100%   BMI 26.40 kg/m   Physical Exam Vitals and nursing note reviewed.  Constitutional:      Appearance: He is well-developed.  HENT:     Head: Normocephalic.     Mouth/Throat:     Mouth: Mucous membranes are moist.  Eyes:     General: No scleral icterus.    Conjunctiva/sclera: Conjunctivae normal.  Neck:     Thyroid: No thyromegaly.  Cardiovascular:     Rate and Rhythm: Normal rate and regular rhythm.     Heart sounds: No murmur heard.   No friction rub. No gallop.  Pulmonary:     Breath sounds: No stridor. No wheezing or rales.  Chest:     Chest wall: No tenderness.  Abdominal:     General: There is no distension.     Tenderness: There is no abdominal tenderness. There is no rebound.  Musculoskeletal:        General: Normal range of motion.     Cervical back: Neck supple.  Lymphadenopathy:     Cervical: No cervical adenopathy.  Skin:    Findings: No erythema or rash.     Comments: Patient has numerous nonsuturable lacerations to arm  Neurological:     Mental Status: He is alert and oriented to person, place, and time.     Motor: No abnormal muscle tone.     Coordination: Coordination normal.  Psychiatric:     Comments: Patient with agitation but not suicidal    ED Results / Procedures / Treatments   Labs (all labs ordered are listed, but only abnormal results are displayed) Labs Reviewed  COMPREHENSIVE METABOLIC PANEL - Abnormal; Notable for the following components:      Result Value   Potassium 5.5 (*)    Glucose, Bld 205 (*)    BUN  39 (*)    Creatinine, Ser 2.58 (*)    Calcium 8.7 (*)    GFR, Estimated 27 (*)    All other components within normal limits  SALICYLATE LEVEL - Abnormal; Notable for the following components:   Salicylate Lvl <6.6 (*)     All other components within normal limits  ACETAMINOPHEN LEVEL - Abnormal; Notable for the following components:   Acetaminophen (Tylenol), Serum <10 (*)    All other components within normal limits  CBC - Abnormal; Notable for the following components:   RBC 3.77 (*)    Hemoglobin 12.9 (*)    HCT 38.3 (*)    MCV 101.6 (*)    MCH 34.2 (*)    All other components within normal limits  POTASSIUM - Abnormal; Notable for the following components:   Potassium 5.3 (*)    All other components within normal limits  ETHANOL  RAPID URINE DRUG SCREEN, HOSP PERFORMED    EKG None  Radiology No results found.  Procedures Procedures   Medications Ordered in ED Medications - No data to display  ED Course  I have reviewed the triage vital signs and the nursing notes.  Pertinent labs & imaging results that were available during my care of the patient were reviewed by me and considered in my medical decision making (see chart for details).    MDM Rules/Calculators/A&P                           Patient with depression but not suicidal.  He was medically cleared and behavioral health felt like he could go back to his group home.  This is happened several times Final Clinical Impression(s) / ED Diagnoses Final diagnoses:  Anxiety    Rx / DC Orders ED Discharge Orders     None        Milton Ferguson, MD 01/06/21 1747

## 2021-01-06 NOTE — ED Triage Notes (Signed)
Pt brought in by [facility] after pt ran away from group home. When pt left group home he went up to laundry mat and tried to cut on his wrist. Pt denies HI/SI.   Pt states he doesn't like the group home.

## 2021-01-06 NOTE — Discharge Instructions (Addendum)
Follow up with your md next week. °
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
## 2021-01-09 ENCOUNTER — Telehealth: Payer: Medicare Other

## 2021-01-15 DIAGNOSIS — F319 Bipolar disorder, unspecified: Secondary | ICD-10-CM | POA: Diagnosis not present

## 2021-01-31 ENCOUNTER — Ambulatory Visit: Payer: Medicare Other | Admitting: Family Medicine

## 2021-02-11 NOTE — Progress Notes (Signed)
Assessment/Plan:    1.  Tremor  -Patient's exam is consistent with exit exam back in February.  He has a component of rest tremor as well as postural tremor.  Had same discussion that we had with him last February.  Really am a little bit leery about giving him more medication.  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  The most disabling tremor likely comes from the Depakote, although he may have some essential tremor as well.  The rest tremor likely comes from the quetiapine.  Discussed risks of polypharmacy.  Discussed weighted gloves for eating.  Patient does not particularly appear to be that bothered by his tremor, but I do think that we should continue to observe him just to make sure he is not developing anything neurodegenerative.  If we decide to add medication in the future, something like a beta-blocker potentially could be of value, although he is a diabetic so we would have to be very careful with this.  2.  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  He has dentures but does not wear them because they are ill fitting.  Discussed that potentially he can get back to the dentist and see if they can adjust the dentures.    Subjective:   David Zamora was seen today in follow up for tremor.  My previous records were reviewed prior to todays visit.  Patient last seen in February, 2022.  Felt that Depakote was likely inducing tremor, although felt that quetiapine could be contributing a bit as well to the rest component of tremor.  I really did not want to add more medication, as he was already on a lot of medicine.  Medical records are reviewed since last visit.  He has been in the urgent care since last visit for issues like weight loss/spilling glucose into the urine (normal  side effect of Farxiga).  He was recently in the emergency room on September 25 after leaving the group home and found trying to cut himself.  Patient was released back to the group home.  Pt group home worker today has been with the patient for about 3 months.  She states that they have to use lids on the cups and have to hold the hands to check the blood sugar.  She is not sure how disabling it is.  Patient reports not that bothersome.  He does note that he has tremor.     ALLERGIES:   Allergies  Allergen Reactions   Soap     White soaps    CURRENT MEDICATIONS:  Outpatient Encounter Medications as of 02/12/2021  Medication Sig   ATHLETES FOOT SPRAY 1 % AERO APPLY TOPICALLY 2 TIMES A DAY.   blood glucose meter kit and supplies KIT Dispense based on patient and insurance preference. Use up to four times daily as directed. (FOR ICD-9 250.00, 250.01).   blood glucose meter kit and supplies Dispense based on patient and insurance preference. Use up to four times daily as directed. (FOR ICD-10 E10.9, E11.9).   buPROPion (WELLBUTRIN XL) 150 MG 24 hr tablet Take 150 mg by mouth every morning.   busPIRone (BUSPAR) 10 MG tablet Take 10 mg by mouth 2 (two) times daily.   Carboxymeth-Glycerin-Polysorb 0.5-1-0.5 % SOLN Apply 1 drop to eye 4 (four) times daily.   divalproex (  DEPAKOTE ER) 250 MG 24 hr tablet Take 250-1,000 mg by mouth See admin instructions. 287m in the morning and 10072m at bedtime   Emollient (CERAVE) CREA APPLY 1 APPLICATION ONCE A DAY TO BOTH FEET   fluticasone (FLONASE) 50 MCG/ACT nasal spray 1 SPRAY EACH NOSTRIL TWICE DAILY AS NEEDED FOR ALLERGIES OR RHINITIS.   FORA Lancets MISC USE TO TEST BLOOD SUGAR 3 TIMES A DAY USE TO TEST BLOOD SUGAR AS NEEDED FOR SIGNS/SYMPTOMS OF HYPOGLYCEMIA (SHAKINESS, SWEATING, BLURRED VIS   furosemide (LASIX) 20 MG tablet TAKE 1 TABLET BY MOUTH ONCE DAILY AS NEEDED FOR WEIGHT GAIN GREATER THAN 3 LBS   glucose blood (FORA V30A BLOOD GLUCOSE TEST)  test strip USE TO TEST BLOOD SUGAR 3 TIMES A DAY AS NEEDED FOR SIGNS/ SYMPTOMS OF HYPOGLYCEMIA SHAKINESS, SWEATING, BLURRED VISION. Dx E11.9   ibuprofen (MOTRIN IB) 200 MG tablet Take 2 tablets (400 mg total) by mouth daily.   insulin aspart (NOVOLOG FLEXPEN) 100 UNIT/ML FlexPen Inject 3-6 Units into the skin 3 (three) times daily with meals. If glucose is above 90 and he is eating.  Follow sliding scale if glucose is above 150 for additional insulin.  If glucose 151-250 give additional 1 unit for total of 5 units with meal; if glucose 251-350 give additional 2 units for total of 6 units with meal; if glucose 351 or higher give additional 3 units for total of 7 units with meals.   insulin degludec (TRESIBA) 100 UNIT/ML FlexTouch Pen Inject 15 Units into the skin at bedtime.   lisinopril (ZESTRIL) 10 MG tablet TAKE (1) TABLET BY MOUTH ONCE DAILY FOR PROTEINURIA   loperamide (IMODIUM A-D) 2 MG tablet Take 1 tablet (2 mg total) by mouth 4 (four) times daily as needed for diarrhea or loose stools.   meclizine (ANTIVERT) 12.5 MG tablet Take 1 tablet (12.5 mg total) by mouth 3 (three) times daily as needed for dizziness.   omeprazole (PRILOSEC) 40 MG capsule Take 1 capsule (40 mg total) by mouth daily.   PARoxetine (PAXIL) 20 MG tablet Take 20 mg by mouth daily.   Phenylephrine-DM-GG (TUSSIN CF PO) Take 5 mLs by mouth 4 (four) times daily as needed.   QUEtiapine (SEROQUEL XR) 300 MG 24 hr tablet Take 300 mg by mouth daily. Taken daily at 4pm   QUEtiapine (SEROQUEL XR) 50 MG TB24 24 hr tablet Take by mouth.   Semaglutide, 1 MG/DOSE, (OZEMPIC, 1 MG/DOSE,) 2 MG/1.5ML SOPN Inject 1 mg into the skin once a week. Start 1 week after you finish 0.51m10men.  Discontinue 0.51mg32mempic   simvastatin (ZOCOR) 40 MG tablet One tablet daily   tamsulosin (FLOMAX) 0.4 MG CAPS capsule TAKE 1 CAPSULE BY MOUTH ONCE A DAY.   tolnaftate (TINACTIN) 1 % spray Apply topically 2 (two) times daily.   Transparent Dressings (TEGADERM  FILM 6"X8") MISC Use to cover free style libre sensor   TRESIBA FLEXTOUCH 100 UNIT/ML FlexTouch Pen Inject into the skin.   triamcinolone ointment (KENALOG) 0.5 % APPLY TOPICALLY TWICE DAILY.   TRUEPLUS PEN NEEDLES 31G X 8 MM MISC USE TO INJECT XULTOPHY DAILY   VASCEPA 1 g capsule TAKE (2) CAPSULES BY MOUTH TWICE DAILY.   Vitamin D, Ergocalciferol, (DRISDOL) 1.25 MG (50000 UNIT) CAPS capsule Take 50,000 Units by mouth once a week.   No facility-administered encounter medications on file as of 02/12/2021.     Objective:    PHYSICAL EXAMINATION:    VITALS:   Vitals:   02/12/21  1320  BP: 126/79  Pulse: 89  SpO2: 95%  Weight: 182 lb 3.2 oz (82.6 kg)  Height: '5\' 11"'  (1.803 m)    GEN:  The patient appears stated age and is in NAD. HEENT:  Normocephalic, atraumatic.  The mucous membranes are moist. The superficial temporal arteries are without ropiness or tenderness.  There is oral buccal lingual dyskinesia. CV:  RRR Lungs:  CTAB Neck/HEME:  There are no carotid bruits bilaterally.  Neurological examination:  Orientation: The patient is alert and oriented x3. Cranial nerves: There is good facial symmetry. The speech is fluent and clear. Soft palate rises symmetrically and there is no tongue deviation. Hearing is intact to conversational tone. Sensation: Sensation is intact to light touch throughout Motor: Strength is at least antigravity x4.  Movement examination: Tone: There is normal tone in the bilateral upper extremities.  The tone in the lower extremities is normal.  Abnormal movements: there is mild left upper extremity rest tremor.  There is L hand tremor with ambulation.  There is postural tremor bilaterally, L>R.  He has trouble with archimedes spirals, L>R.  he has some mild difficulty when asked to pour a full glass of water from one glass to another.  He actually does not spill much of it. Coordination:  There is no decremation with RAM's, with any form of RAMS,  including alternating supination and pronation of the forearm, hand opening and closing, finger taps, heel taps and toe taps. Gait and Station: The patient has no difficulty arising out of a deep-seated chair without the use of the hands. The patient's stride length is good.   I have reviewed and interpreted the following labs independently   Chemistry      Component Value Date/Time   NA 137 01/06/2021 1458   NA 143 12/10/2020 1522   NA 133 (L) 02/20/2013 1229   K 5.3 (H) 01/06/2021 1614   K 4.1 02/20/2013 1229   CL 103 01/06/2021 1458   CL 100 02/20/2013 1229   CO2 23 01/06/2021 1458   CO2 28 02/20/2013 1229   BUN 39 (H) 01/06/2021 1458   BUN 29 (H) 12/10/2020 1522   BUN 27 (H) 02/20/2013 1229   CREATININE 2.58 (H) 01/06/2021 1458   CREATININE 1.60 (H) 02/20/2013 1229      Component Value Date/Time   CALCIUM 8.7 (L) 01/06/2021 1458   CALCIUM 9.1 02/20/2013 1229   ALKPHOS 48 01/06/2021 1458   ALKPHOS 121 02/20/2013 1229   AST 20 01/06/2021 1458   AST 31 02/20/2013 1229   ALT 15 01/06/2021 1458   ALT 26 02/20/2013 1229   BILITOT 0.4 01/06/2021 1458   BILITOT 0.2 11/29/2020 1519   BILITOT 0.3 02/20/2013 1229      Lab Results  Component Value Date   WBC 6.4 01/06/2021   HGB 12.9 (L) 01/06/2021   HCT 38.3 (L) 01/06/2021   MCV 101.6 (H) 01/06/2021   PLT 229 01/06/2021   Lab Results  Component Value Date   TSH 3.780 11/29/2020     Chemistry      Component Value Date/Time   NA 137 01/06/2021 1458   NA 143 12/10/2020 1522   NA 133 (L) 02/20/2013 1229   K 5.3 (H) 01/06/2021 1614   K 4.1 02/20/2013 1229   CL 103 01/06/2021 1458   CL 100 02/20/2013 1229   CO2 23 01/06/2021 1458   CO2 28 02/20/2013 1229   BUN 39 (H) 01/06/2021 1458   BUN 29 (H) 12/10/2020  1522   BUN 27 (H) 02/20/2013 1229   CREATININE 2.58 (H) 01/06/2021 1458   CREATININE 1.60 (H) 02/20/2013 1229      Component Value Date/Time   CALCIUM 8.7 (L) 01/06/2021 1458   CALCIUM 9.1 02/20/2013 1229    ALKPHOS 48 01/06/2021 1458   ALKPHOS 121 02/20/2013 1229   AST 20 01/06/2021 1458   AST 31 02/20/2013 1229   ALT 15 01/06/2021 1458   ALT 26 02/20/2013 1229   BILITOT 0.4 01/06/2021 1458   BILITOT 0.2 11/29/2020 1519   BILITOT 0.3 02/20/2013 1229         Total time spent on today's visit was 38 minutes, including both face-to-face time and nonface-to-face time.  Time included that spent on review of records (prior notes available to me/labs/imaging if pertinent), discussing treatment and goals, answering patient's questions and coordinating care.  Cc:  Dettinger, Fransisca Kaufmann, MD

## 2021-02-12 ENCOUNTER — Other Ambulatory Visit: Payer: Self-pay

## 2021-02-12 ENCOUNTER — Ambulatory Visit (INDEPENDENT_AMBULATORY_CARE_PROVIDER_SITE_OTHER): Payer: Medicare Other | Admitting: Neurology

## 2021-02-12 ENCOUNTER — Encounter: Payer: Self-pay | Admitting: Neurology

## 2021-02-12 VITALS — BP 126/79 | HR 89 | Ht 71.0 in | Wt 182.2 lb

## 2021-02-12 DIAGNOSIS — G251 Drug-induced tremor: Secondary | ICD-10-CM

## 2021-02-12 DIAGNOSIS — G244 Idiopathic orofacial dystonia: Secondary | ICD-10-CM

## 2021-02-13 ENCOUNTER — Other Ambulatory Visit: Payer: Self-pay | Admitting: Family Medicine

## 2021-02-13 ENCOUNTER — Ambulatory Visit (INDEPENDENT_AMBULATORY_CARE_PROVIDER_SITE_OTHER): Payer: Medicare Other | Admitting: Nurse Practitioner

## 2021-02-13 ENCOUNTER — Encounter: Payer: Self-pay | Admitting: Nurse Practitioner

## 2021-02-13 VITALS — BP 114/72 | HR 73 | Ht 71.0 in | Wt 185.6 lb

## 2021-02-13 DIAGNOSIS — N184 Chronic kidney disease, stage 4 (severe): Secondary | ICD-10-CM

## 2021-02-13 DIAGNOSIS — E1122 Type 2 diabetes mellitus with diabetic chronic kidney disease: Secondary | ICD-10-CM

## 2021-02-13 DIAGNOSIS — E1159 Type 2 diabetes mellitus with other circulatory complications: Secondary | ICD-10-CM | POA: Diagnosis not present

## 2021-02-13 DIAGNOSIS — E782 Mixed hyperlipidemia: Secondary | ICD-10-CM | POA: Diagnosis not present

## 2021-02-13 DIAGNOSIS — Z794 Long term (current) use of insulin: Secondary | ICD-10-CM

## 2021-02-13 DIAGNOSIS — I152 Hypertension secondary to endocrine disorders: Secondary | ICD-10-CM

## 2021-02-13 MED ORDER — FREESTYLE SYSTEM KIT: 1.0000 | PACK | 1 refills | Status: DC | PRN

## 2021-02-13 MED ORDER — TRUEPLUS PEN NEEDLES 31G X 8 MM MISC: 11 refills | Status: DC
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
## 2021-02-13 NOTE — Patient Instructions (Signed)

## 2021-02-13 NOTE — Progress Notes (Signed)
Sent pen needles and glucometer for the patient.

## 2021-02-21 ENCOUNTER — Other Ambulatory Visit: Payer: Medicare Other

## 2021-02-21 DIAGNOSIS — Z23 Encounter for immunization: Secondary | ICD-10-CM | POA: Diagnosis not present

## 2021-02-21 DIAGNOSIS — I9589 Other hypotension: Secondary | ICD-10-CM | POA: Diagnosis not present

## 2021-02-21 DIAGNOSIS — D631 Anemia in chronic kidney disease: Secondary | ICD-10-CM | POA: Diagnosis not present

## 2021-02-21 DIAGNOSIS — E875 Hyperkalemia: Secondary | ICD-10-CM | POA: Diagnosis not present

## 2021-02-21 DIAGNOSIS — R809 Proteinuria, unspecified: Secondary | ICD-10-CM | POA: Diagnosis not present

## 2021-02-21 DIAGNOSIS — N189 Chronic kidney disease, unspecified: Secondary | ICD-10-CM | POA: Diagnosis not present

## 2021-02-21 DIAGNOSIS — E1129 Type 2 diabetes mellitus with other diabetic kidney complication: Secondary | ICD-10-CM | POA: Diagnosis not present

## 2021-02-21 DIAGNOSIS — N281 Cyst of kidney, acquired: Secondary | ICD-10-CM | POA: Diagnosis not present

## 2021-02-21 DIAGNOSIS — E1122 Type 2 diabetes mellitus with diabetic chronic kidney disease: Secondary | ICD-10-CM | POA: Diagnosis not present

## 2021-02-26 ENCOUNTER — Ambulatory Visit (INDEPENDENT_AMBULATORY_CARE_PROVIDER_SITE_OTHER): Payer: Medicare Other | Admitting: Licensed Clinical Social Worker

## 2021-02-26 DIAGNOSIS — F79 Unspecified intellectual disabilities: Secondary | ICD-10-CM

## 2021-02-26 DIAGNOSIS — K219 Gastro-esophageal reflux disease without esophagitis: Secondary | ICD-10-CM

## 2021-02-26 DIAGNOSIS — E1122 Type 2 diabetes mellitus with diabetic chronic kidney disease: Secondary | ICD-10-CM

## 2021-02-26 DIAGNOSIS — E1159 Type 2 diabetes mellitus with other circulatory complications: Secondary | ICD-10-CM

## 2021-02-26 DIAGNOSIS — F3341 Major depressive disorder, recurrent, in partial remission: Secondary | ICD-10-CM

## 2021-02-26 DIAGNOSIS — E1165 Type 2 diabetes mellitus with hyperglycemia: Secondary | ICD-10-CM

## 2021-02-26 NOTE — Patient Instructions (Addendum)
Visit Information  Patient Goals:  Manage My Emotions (Patient). Manage Depression issues  Timeframe:  Short-Term Goal Priority:  High Progress: On Track Start Date:     02/26/21                        Expected End Date:   05/27/21                    Follow Up Date 04/26/21 at 2:30 PM   Manage My Emotions (Patient)  ;Manage Depression Issues    Why is this important?   When you are stressed, down or upset, your body reacts too.  For example, your blood pressure may get higher; you may have a headache or stomachache.  When your emotions get the best of you, your body's ability to fight off cold and flu gets weak.  These steps will help you manage your emotions.     Patient Self Care Activities:  Attends all scheduled provider appointments Performs ADL's independently  Patient Coping Strengths:  Family Friends  Patient Self Care Deficits:  Some Pain issues Mobility issues  Patient Goals:  - spend time or talk with others at least 2 to 3 times per week - practice relaxation or meditation daily - keep a calendar with appointment dates  Follow Up Plan: LCSW to call client or [place], nurse at [facility] on 04/26/21 at 2:30 PM to assess needs of client    [name].Serapio Edelson MSW, LCSW Licensed Clinical Social Worker [facility] Care Management [phone]

## 2021-02-26 NOTE — Chronic Care Management (AMB) (Signed)
Chronic Care Management    Clinical Social Work Note  02/26/2021 Name: David Zamora MRN: [id number] DOB: 1961-03-25  David Zamora is a 60 y.o. year old male who is a primary care patient of Dettinger, Fransisca Kaufmann, MD. The CCM team was consulted to assist the patient with chronic disease management and/or care coordination needs related to: [employer] .   Engaged with patient /nurse of patient, [place], by telephone for follow up visit in response to provider referral for social work chronic care management and care coordination services.   Consent to Services:  The patient was given information about Chronic Care Management services, agreed to services, and gave verbal consent prior to initiation of services.  Please see initial visit note for detailed documentation.   Patient agreed to services and consent obtained.   Assessment: Review of patient past medical history, allergies, medications, and health status, including review of relevant consultants reports was performed today as part of a comprehensive evaluation and provision of chronic care management and care coordination services.     SDOH (Social Determinants of Health) assessments and interventions performed:  SDOH Interventions    Flowsheet Row Most Recent Value  SDOH Interventions   Physical Activity Interventions Other (Comments)  [client has some walking challenges]  Stress Interventions Provide Counseling  [client has stress related to managing his medical needs]  Depression Interventions/Treatment  Currently on Treatment        Advanced Directives Status: See Vynca application for related entries.  CCM Care Plan  Allergies  Allergen Reactions   Soap     White soaps    Outpatient Encounter Medications as of 02/26/2021  Medication Sig   ATHLETES FOOT SPRAY 1 % AERO APPLY TOPICALLY 2 TIMES A DAY.   blood glucose meter kit and supplies KIT Dispense based on patient and insurance preference. Use up to  four times daily as directed. (FOR ICD-9 250.00, 250.01).   blood glucose meter kit and supplies Dispense based on patient and insurance preference. Use up to four times daily as directed. (FOR ICD-10 E10.9, E11.9).   buPROPion (WELLBUTRIN XL) 150 MG 24 hr tablet Take 150 mg by mouth every morning.   busPIRone (BUSPAR) 10 MG tablet Take 10 mg by mouth 2 (two) times daily.   Carboxymeth-Glycerin-Polysorb 0.5-1-0.5 % SOLN Apply 1 drop to eye 4 (four) times daily.   divalproex (DEPAKOTE ER) 250 MG 24 hr tablet Take 250-1,000 mg by mouth See admin instructions. 256m in the morning and 10068m at bedtime   Emollient (CERAVE) CREA APPLY 1 APPLICATION ONCE A DAY TO BOTH FEET   FARXIGA 10 MG TABS tablet Take 10 mg by mouth daily.   fluticasone (FLONASE) 50 MCG/ACT nasal spray 1 SPRAY EACH NOSTRIL TWICE DAILY AS NEEDED FOR ALLERGIES OR RHINITIS.   FORA Lancets MISC USE TO TEST BLOOD SUGAR 3 TIMES A DAY USE TO TEST BLOOD SUGAR AS NEEDED FOR SIGNS/SYMPTOMS OF HYPOGLYCEMIA (SHAKINESS, SWEATING, BLURRED VIS   furosemide (LASIX) 20 MG tablet TAKE 1 TABLET BY MOUTH ONCE DAILY AS NEEDED FOR WEIGHT GAIN GREATER THAN 3 LBS   GLOBAL EASE INJECT PEN NEEDLES 31G X 5 MM MISC SMARTSIG:Syringe(s) SUB-Q   glucose blood (FORA V30A BLOOD GLUCOSE TEST) test strip USE TO TEST BLOOD SUGAR 3 TIMES A DAY AS NEEDED FOR SIGNS/ SYMPTOMS OF HYPOGLYCEMIA SHAKINESS, SWEATING, BLURRED VISION. Dx E11.9   glucose monitoring kit (FREESTYLE) monitoring kit 1 each by Does not apply route as needed for other.   ibuprofen (  MOTRIN IB) 200 MG tablet Take 2 tablets (400 mg total) by mouth daily.   insulin aspart (NOVOLOG FLEXPEN) 100 UNIT/ML FlexPen Inject 3-6 Units into the skin 3 (three) times daily with meals. If glucose is above 90 and he is eating.  Follow sliding scale if glucose is above 150 for additional insulin.  If glucose 151-250 give additional 1 unit for total of 5 units with meal; if glucose 251-350 give additional 2 units for total  of 6 units with meal; if glucose 351 or higher give additional 3 units for total of 7 units with meals.   insulin degludec (TRESIBA) 100 UNIT/ML FlexTouch Pen Inject 15 Units into the skin at bedtime.   Insulin Pen Needle (TRUEPLUS PEN NEEDLES) 31G X 8 MM MISC Use 1 pen needle to administer insulin 4 times daily   lisinopril (ZESTRIL) 10 MG tablet TAKE (1) TABLET BY MOUTH ONCE DAILY FOR PROTEINURIA   loperamide (IMODIUM A-D) 2 MG tablet Take 1 tablet (2 mg total) by mouth 4 (four) times daily as needed for diarrhea or loose stools.   meclizine (ANTIVERT) 12.5 MG tablet Take 1 tablet (12.5 mg total) by mouth 3 (three) times daily as needed for dizziness.   omeprazole (PRILOSEC) 40 MG capsule Take 1 capsule (40 mg total) by mouth daily.   PARoxetine (PAXIL) 20 MG tablet Take 20 mg by mouth daily.   PARoxetine (PAXIL) 30 MG tablet Take by mouth.   Phenylephrine-DM-GG (TUSSIN CF PO) Take 5 mLs by mouth 4 (four) times daily as needed.   QUEtiapine (SEROQUEL XR) 300 MG 24 hr tablet Take 300 mg by mouth daily. Taken daily at 4pm   QUEtiapine (SEROQUEL XR) 50 MG TB24 24 hr tablet Take by mouth.   Semaglutide, 1 MG/DOSE, (OZEMPIC, 1 MG/DOSE,) 2 MG/1.5ML SOPN Inject 1 mg into the skin once a week. Start 1 week after you finish 0.50m pen.  Discontinue 0.522mOzempic   simvastatin (ZOCOR) 40 MG tablet One tablet daily   tamsulosin (FLOMAX) 0.4 MG CAPS capsule TAKE 1 CAPSULE BY MOUTH ONCE A DAY.   tolnaftate (TINACTIN) 1 % spray Apply topically 2 (two) times daily.   Transparent Dressings (TEGADERM FILM 6"X8") MISC Use to cover free style libre sensor   TRESIBA FLEXTOUCH 100 UNIT/ML FlexTouch Pen Inject into the skin.   triamcinolone ointment (KENALOG) 0.5 % APPLY TOPICALLY TWICE DAILY.   VASCEPA 1 g capsule TAKE (2) CAPSULES BY MOUTH TWICE DAILY.   Vitamin D, Ergocalciferol, (DRISDOL) 1.25 MG (50000 UNIT) CAPS capsule Take 50,000 Units by mouth once a week.   No facility-administered encounter medications  on file as of 02/26/2021.    Patient Active Problem List   Diagnosis Date Noted   Hyperlipidemia associated with type 2 diabetes mellitus ([facility]08/29/2022   Vertigo 08/15/2020   Anemia of chronic renal failure 12/04/2018   CKD stage 3 due to type 2 diabetes mellitus ([facility]02/03/2019   Hyperkalemia 12/24/2015   Intellectual disability 12/24/2015   Hypertension associated with diabetes ([place]09/02/2016   Depressive disorder 12/24/2015   Gastroesophageal reflux disease 12/24/2015   Controlled type 2 diabetes mellitus with stage 3 chronic kidney disease, with long-term current use of insulin ([place]/02/2016    Conditions to be addressed/monitored: monitor client management of depression issues  Care Plan : LCSW care plan  Updates made by FoKatha CabalLCSW since 02/26/2021 12:00 AM     Problem: Emotional Distress      Goal: Emotional Health Supported;Manage Depression issues  Start Date: 02/26/2021  Expected End Date: 05/27/2021  This Visit's Progress: On track  Recent Progress: On track  Priority: High  Note:   Current Barriers:  Chronic Mental Health needs related to depression issues Pain issues Occasional anger issues Suicidal Ideation/Homicidal Ideation: No  Clinical Social Work Goal(s):  patient will work with SW monthly by telephone or in person to reduce or manage symptoms related to depression and depression management  Patient will work with SW monthly to discuss pain issues of client and to discuss mobility issues of client  Patient will cooperate weekly with caregivers at [place] who are helping him with daily needs  Interventions: 1:1 collaboration with Dettinger, Fransisca Kaufmann, MD regarding development and update of comprehensive plan of care as evidenced by provider attestation and co-signature Discussed client needs with [place], nurse at [facility]. She said client is not doing very well in managing Diabetes. She said he will not wear  CGM more than one day and then takes it off She said he often eats regular meals and then will eat candy or sweets that he locates. She said he goes to [facility] which is close to group home and he buys sweets at the [place] and eats them there and returns to group home.  LCSW encouraged [place] to talk with Dr. Lottie Dawson, [facility] Pharmacist, about client Diabetes management challenges Discussed client needs with [place]:  sleeping issues, pain issues, appetite of client Reviewed ambulation needs with [place].  He said he is walking well at present.  He did not mention any walking problems. Reviewed energy level. Client has low energy occasionally Discussed relaxation techniques with client. He likes group activities from group home like bowling or going to movies.   LCSW talked with Kasandra Knudsen about management of agitation of client. He said he goes to his room and takes a nap when agitated. He tries to do independent activities when he is agitated. Collaborated with Dr. Lottie Dawson, today regarding client challenges in managing Diabetes Reminded [place] and [place] of LCSW support for client through [facility] office Reminded Kasandra Knudsen and [place] of [facility] support for client through [facility] office  Patient Self Care Activities:  Attends all scheduled provider appointments Performs ADL's independently  Patient Coping Strengths:  Family Friends  Patient Self Care Deficits:  Some Pain issues Mobility issues  Patient Goals:  - spend time or talk with others at least 2 to 3 times per week - practice relaxation or meditation daily - keep a calendar with appointment dates  Follow Up Plan: LCSW to call client or [place], nurse, at [place] on 04/26/21 at 2:30 PM to discuss needs of client      [name].Maryiah Olvey MSW, LCSW Licensed Clinical Social Worker [facility] Care Management [phone]

## 2021-02-28 DIAGNOSIS — E1122 Type 2 diabetes mellitus with diabetic chronic kidney disease: Secondary | ICD-10-CM | POA: Diagnosis not present

## 2021-02-28 DIAGNOSIS — N189 Chronic kidney disease, unspecified: Secondary | ICD-10-CM | POA: Diagnosis not present

## 2021-02-28 DIAGNOSIS — I9589 Other hypotension: Secondary | ICD-10-CM | POA: Diagnosis not present

## 2021-02-28 DIAGNOSIS — R809 Proteinuria, unspecified: Secondary | ICD-10-CM | POA: Diagnosis not present

## 2021-02-28 DIAGNOSIS — E1129 Type 2 diabetes mellitus with other diabetic kidney complication: Secondary | ICD-10-CM | POA: Diagnosis not present

## 2021-02-28 DIAGNOSIS — D631 Anemia in chronic kidney disease: Secondary | ICD-10-CM | POA: Diagnosis not present

## 2021-03-04 DIAGNOSIS — F319 Bipolar disorder, unspecified: Secondary | ICD-10-CM | POA: Diagnosis not present

## 2021-03-26 DIAGNOSIS — L84 Corns and callosities: Secondary | ICD-10-CM | POA: Diagnosis not present

## 2021-03-26 DIAGNOSIS — E1142 Type 2 diabetes mellitus with diabetic polyneuropathy: Secondary | ICD-10-CM | POA: Diagnosis not present

## 2021-03-26 DIAGNOSIS — B351 Tinea unguium: Secondary | ICD-10-CM | POA: Diagnosis not present

## 2021-03-29 ENCOUNTER — Ambulatory Visit: Payer: Medicare Other | Admitting: Family Medicine

## 2021-04-19 ENCOUNTER — Encounter: Payer: Self-pay | Admitting: Family Medicine

## 2021-04-19 ENCOUNTER — Other Ambulatory Visit: Payer: Self-pay

## 2021-04-19 ENCOUNTER — Ambulatory Visit (INDEPENDENT_AMBULATORY_CARE_PROVIDER_SITE_OTHER): Payer: Commercial Managed Care - HMO | Admitting: Family Medicine

## 2021-04-19 VITALS — BP 127/79 | HR 74 | Ht 71.0 in | Wt 189.0 lb

## 2021-04-19 DIAGNOSIS — E1165 Type 2 diabetes mellitus with hyperglycemia: Secondary | ICD-10-CM | POA: Diagnosis not present

## 2021-04-19 DIAGNOSIS — Z794 Long term (current) use of insulin: Secondary | ICD-10-CM

## 2021-04-19 DIAGNOSIS — E785 Hyperlipidemia, unspecified: Secondary | ICD-10-CM | POA: Diagnosis not present

## 2021-04-19 DIAGNOSIS — I152 Hypertension secondary to endocrine disorders: Secondary | ICD-10-CM

## 2021-04-19 DIAGNOSIS — N183 Chronic kidney disease, stage 3 unspecified: Secondary | ICD-10-CM

## 2021-04-19 DIAGNOSIS — E1159 Type 2 diabetes mellitus with other circulatory complications: Secondary | ICD-10-CM

## 2021-04-19 DIAGNOSIS — E1122 Type 2 diabetes mellitus with diabetic chronic kidney disease: Secondary | ICD-10-CM | POA: Diagnosis not present

## 2021-04-19 DIAGNOSIS — E1169 Type 2 diabetes mellitus with other specified complication: Secondary | ICD-10-CM | POA: Diagnosis not present

## 2021-04-19 LAB — CBC WITH DIFFERENTIAL/PLATELET
Basophils Absolute: 0 10*3/uL (ref 0.0–0.2)
Basos: 0 %
EOS (ABSOLUTE): 0.1 10*3/uL (ref 0.0–0.4)
Eos: 3 %
Hematocrit: 36 % — ABNORMAL LOW (ref 37.5–51.0)
Hemoglobin: 12.5 g/dL — ABNORMAL LOW (ref 13.0–17.7)
Immature Grans (Abs): 0 10*3/uL (ref 0.0–0.1)
Immature Granulocytes: 0 %
Lymphocytes Absolute: 2.5 10*3/uL (ref 0.7–3.1)
Lymphs: 46 %
MCH: 33.5 pg — ABNORMAL HIGH (ref 26.6–33.0)
MCHC: 34.7 g/dL (ref 31.5–35.7)
MCV: 97 fL (ref 79–97)
Monocytes Absolute: 0.4 10*3/uL (ref 0.1–0.9)
Monocytes: 8 %
Neutrophils Absolute: 2.3 10*3/uL (ref 1.4–7.0)
Neutrophils: 43 %
Platelets: 193 10*3/uL (ref 150–450)
RBC: 3.73 x10E6/uL — ABNORMAL LOW (ref 4.14–5.80)
RDW: 13 % (ref 11.6–15.4)
WBC: 5.4 10*3/uL (ref 3.4–10.8)

## 2021-04-19 LAB — CMP14+EGFR
ALT: 13 IU/L (ref 0–44)
AST: 18 IU/L (ref 0–40)
Albumin/Globulin Ratio: 2.1 (ref 1.2–2.2)
Albumin: 4.4 g/dL (ref 3.8–4.8)
Alkaline Phosphatase: 62 IU/L (ref 44–121)
BUN/Creatinine Ratio: 11 (ref 10–24)
BUN: 27 mg/dL (ref 8–27)
Bilirubin Total: 0.2 mg/dL (ref 0.0–1.2)
CO2: 24 mmol/L (ref 20–29)
Calcium: 9.6 mg/dL (ref 8.6–10.2)
Chloride: 102 mmol/L (ref 96–106)
Creatinine, Ser: 2.37 mg/dL — ABNORMAL HIGH (ref 0.76–1.27)
Globulin, Total: 2.1 g/dL (ref 1.5–4.5)
Glucose: 94 mg/dL (ref 70–99)
Potassium: 5.8 mmol/L — ABNORMAL HIGH (ref 3.5–5.2)
Sodium: 140 mmol/L (ref 134–144)
Total Protein: 6.5 g/dL (ref 6.0–8.5)
eGFR: 30 mL/min/{1.73_m2} — ABNORMAL LOW (ref >59)

## 2021-04-19 LAB — LIPID PANEL
Chol/HDL Ratio: 3.3 ratio (ref 0.0–5.0)
Cholesterol, Total: 152 mg/dL (ref 100–199)
HDL: 46 mg/dL (ref >39)
LDL Chol Calc (NIH): 74 mg/dL (ref 0–99)
Triglycerides: 188 mg/dL — ABNORMAL HIGH (ref 0–149)
VLDL Cholesterol Cal: 32 mg/dL (ref 5–40)

## 2021-04-19 LAB — BAYER DCA HB A1C WAIVED: HB A1C (BAYER DCA - WAIVED): 5.7 % — ABNORMAL HIGH (ref 4.8–5.6)

## 2021-04-19 MED ORDER — SIMVASTATIN 40 MG PO TABS: ORAL_TABLET | ORAL | 30 refills | Status: DC

## 2021-04-19 MED ORDER — OZEMPIC (1 MG/DOSE) 2 MG/1.5ML ~~LOC~~ SOPN: 1.0000 mg | PEN_INJECTOR | SUBCUTANEOUS | 2 refills | Status: DC

## 2021-04-19 MED ORDER — LISINOPRIL 10 MG PO TABS: ORAL_TABLET | ORAL | 3 refills | Status: DC

## 2021-04-19 MED ORDER — FLUTICASONE PROPIONATE 50 MCG/ACT NA SUSP: NASAL | 99 refills | Status: DC

## 2021-04-19 NOTE — Progress Notes (Signed)
BP 127/79    Pulse 74    Ht '5\' 11"'  (1.803 m)    Wt 189 lb (85.7 kg)    SpO2 100%    BMI 26.36 kg/m    Subjective:   Patient ID: David Zamora, male    DOB: Jun 14, 1960, 61 y.o.   MRN: [id number]  HPI: David Zamora is a 61 y.o. male presenting on 04/19/2021 for Medical Management of Chronic Issues and Diabetes   HPI Type 2 diabetes mellitus Patient comes in today for recheck of his diabetes. Patient has been currently taking [place] and NovoLog sliding scale and Tresiba 15 units daily and Ozempic. Patient is currently on an ACE inhibitor/ARB. Patient has seen an ophthalmologist this year. Patient denies any issues with their feet. The symptom started onset as an adult hyperlipidemia and hypertension and CKD 3 ARE RELATED TO DM   Hyperlipidemia Patient is coming in for recheck of his hyperlipidemia. The patient is currently taking simvastatin and Vascepa. They deny any issues with myalgias or history of liver damage from it. They deny any focal numbness or weakness or chest pain.   Hypertension Patient is currently on lisinopril and furosemide, and their blood pressure today is 127/70. Patient denies any lightheadedness or dizziness. Patient denies headaches, blurred vision, chest pains, shortness of breath, or weakness. Denies any side effects from medication and is content with current medication.   Relevant past medical, surgical, family and social history reviewed and updated as indicated. Interim medical history since our last visit reviewed. Allergies and medications reviewed and updated.  Review of Systems  Constitutional:  Negative for chills and fever.  Eyes:  Negative for visual disturbance.  Respiratory:  Negative for shortness of breath and wheezing.   Cardiovascular:  Negative for chest pain and leg swelling.  Musculoskeletal:  Negative for back pain and gait problem.  Skin:  Negative for rash.  Neurological:  Negative for dizziness, weakness and light-headedness.  All  other systems reviewed and are negative.  Per HPI unless specifically indicated above   Allergies as of 04/19/2021       Reactions   Soap    White soaps        Medication List        Accurate as of April 19, 2021 11:47 AM. If you have any questions, ask your nurse or doctor.          blood glucose meter kit and supplies Dispense based on patient and insurance preference. Use up to four times daily as directed. (FOR ICD-10 E10.9, E11.9).   blood glucose meter kit and supplies Kit Dispense based on patient and insurance preference. Use up to four times daily as directed. (FOR ICD-9 250.00, 250.01).   buPROPion 150 MG 24 hr tablet Commonly known as: WELLBUTRIN XL Take 150 mg by mouth every morning.   busPIRone 10 MG tablet Commonly known as: BUSPAR Take 10 mg by mouth 2 (two) times daily.   Carboxymeth-Glycerin-Polysorb 0.5-1-0.5 % Soln Apply 1 drop to eye 4 (four) times daily.   CeraVe Crea APPLY 1 APPLICATION ONCE A DAY TO BOTH FEET   divalproex 250 MG 24 hr tablet Commonly known as: DEPAKOTE ER Take 250-1,000 mg by mouth See admin instructions. 256m in the morning and 10076m at bedtime   Farxiga 10 MG Tabs tablet Generic drug: dapagliflozin propanediol Take 10 mg by mouth daily.   fluticasone 50 MCG/ACT nasal spray Commonly known as: FLONASE 1 SPRAY EACH NOSTRIL TWICE DAILY AS NEEDED  FOR ALLERGIES OR RHINITIS.   FORA Lancets Misc USE TO TEST BLOOD SUGAR 3 TIMES A DAY USE TO TEST BLOOD SUGAR AS NEEDED FOR SIGNS/SYMPTOMS OF HYPOGLYCEMIA (SHAKINESS, SWEATING, BLURRED VIS   FORA V30a Blood Glucose Test test strip Generic drug: glucose blood USE TO TEST BLOOD SUGAR 3 TIMES A DAY AS NEEDED FOR SIGNS/ SYMPTOMS OF HYPOGLYCEMIA SHAKINESS, SWEATING, BLURRED VISION. Dx E11.9   furosemide 20 MG tablet Commonly known as: LASIX TAKE 1 TABLET BY MOUTH ONCE DAILY AS NEEDED FOR WEIGHT GAIN GREATER THAN 3 LBS   Global Ease Inject Pen Needles 31G X 5 MM Misc Generic  drug: Insulin Pen Needle SMARTSIG:Syringe(s) SUB-Q   TRUEplus Pen Needles 31G X 8 MM Misc Generic drug: Insulin Pen Needle Use 1 pen needle to administer insulin 4 times daily   glucose monitoring kit monitoring kit 1 each by Does not apply route as needed for other.   ibuprofen 200 MG tablet Commonly known as: Motrin IB Take 2 tablets (400 mg total) by mouth daily.   insulin degludec 100 UNIT/ML FlexTouch Pen Commonly known as: TRESIBA Inject 15 Units into the skin at bedtime.   Tyler Aas FlexTouch 100 UNIT/ML FlexTouch Pen Generic drug: insulin degludec Inject into the skin.   lisinopril 10 MG tablet Commonly known as: ZESTRIL TAKE (1) TABLET BY MOUTH ONCE DAILY FOR PROTEINURIA   loperamide 2 MG tablet Commonly known as: Imodium A-D Take 1 tablet (2 mg total) by mouth 4 (four) times daily as needed for diarrhea or loose stools.   meclizine 12.5 MG tablet Commonly known as: ANTIVERT Take 1 tablet (12.5 mg total) by mouth 3 (three) times daily as needed for dizziness.   NovoLOG FlexPen 100 UNIT/ML FlexPen Generic drug: insulin aspart Inject 3-6 Units into the skin 3 (three) times daily with meals. If glucose is above 90 and he is eating.  Follow sliding scale if glucose is above 150 for additional insulin.  If glucose 151-250 give additional 1 unit for total of 5 units with meal; if glucose 251-350 give additional 2 units for total of 6 units with meal; if glucose 351 or higher give additional 3 units for total of 7 units with meals.   omeprazole 40 MG capsule Commonly known as: PRILOSEC Take 1 capsule (40 mg total) by mouth daily.   Ozempic (1 MG/DOSE) 2 MG/1.5ML Sopn Generic drug: Semaglutide (1 MG/DOSE) Inject 1 mg into the skin once a week. Start 1 week after you finish 0.37m pen.  Discontinue 0.576mOzempic   PARoxetine 20 MG tablet Commonly known as: PAXIL Take 20 mg by mouth daily.   PARoxetine 30 MG tablet Commonly known as: PAXIL Take by mouth.   QUEtiapine  300 MG 24 hr tablet Commonly known as: SEROQUEL XR Take 300 mg by mouth daily. Taken daily at 4pm   QUEtiapine 50 MG Tb24 24 hr tablet Commonly known as: SEROQUEL XR Take by mouth.   simvastatin 40 MG tablet Commonly known as: ZOCOR One tablet daily   tamsulosin 0.4 MG Caps capsule Commonly known as: FLOMAX TAKE 1 CAPSULE BY MOUTH ONCE A DAY.   Tegaderm Film 6"x8" Misc Use to cover free style libre sensor   tolnaftate 1 % spray Commonly known as: TINACTIN Apply topically 2 (two) times daily.   Athletes Foot Spray 1 % Aero Generic drug: Tolnaftate APPLY TOPICALLY 2 TIMES A DAY.   triamcinolone ointment 0.5 % Commonly known as: KENALOG APPLY TOPICALLY TWICE DAILY.   TUSSIN CF PO Take 5 mLs  by mouth 4 (four) times daily as needed.   Vascepa 1 g capsule Generic drug: icosapent Ethyl TAKE (2) CAPSULES BY MOUTH TWICE DAILY.   Vitamin D (Ergocalciferol) 1.25 MG (50000 UNIT) Caps capsule Commonly known as: DRISDOL Take 50,000 Units by mouth once a week.         Objective:   BP 127/79    Pulse 74    Ht '5\' 11"'  (1.803 m)    Wt 189 lb (85.7 kg)    SpO2 100%    BMI 26.36 kg/m   Wt Readings from Last 3 Encounters:  04/19/21 189 lb (85.7 kg)  02/13/21 185 lb 9.6 oz (84.2 kg)  02/12/21 182 lb 3.2 oz (82.6 kg)    Physical Exam Vitals and nursing note reviewed.  Constitutional:      General: He is not in acute distress.    Appearance: He is well-developed. He is not diaphoretic.  Eyes:     General: No scleral icterus.    Conjunctiva/sclera: Conjunctivae normal.  Neck:     Thyroid: No thyromegaly.  Cardiovascular:     Rate and Rhythm: Normal rate and regular rhythm.     Heart sounds: Normal heart sounds. No murmur heard. Pulmonary:     Effort: Pulmonary effort is normal. No respiratory distress.     Breath sounds: Normal breath sounds. No wheezing.  Musculoskeletal:        General: No swelling. Normal range of motion.     Cervical back: Neck supple.   Lymphadenopathy:     Cervical: No cervical adenopathy.  Skin:    General: Skin is warm and dry.     Findings: No rash.  Neurological:     Mental Status: He is alert and oriented to person, place, and time.     Coordination: Coordination normal.  Psychiatric:        Behavior: Behavior normal.    Results for orders placed or performed in visit on 04/19/21  Bayer DCA Hb A1c Waived  Result Value Ref Range   HB A1C (BAYER DCA - WAIVED) 5.7 (H) 4.8 - 5.6 %    Assessment & Plan:   Problem List Items Addressed This Visit       Cardiovascular and Mediastinum   Hypertension associated with diabetes ([place])   Relevant Medications   lisinopril (ZESTRIL) 10 MG tablet   Semaglutide, 1 MG/DOSE, (OZEMPIC, 1 MG/DOSE,) 2 MG/1.5ML SOPN   simvastatin (ZOCOR) 40 MG tablet   Other Relevant Orders   CBC with Differential/Platelet   CMP14+EGFR   Lipid panel   Bayer DCA Hb A1c Waived (Completed)     Endocrine   Controlled type 2 diabetes mellitus with stage 3 chronic kidney disease, with long-term current use of insulin (HCC)   Relevant Medications   lisinopril (ZESTRIL) 10 MG tablet   Semaglutide, 1 MG/DOSE, (OZEMPIC, 1 MG/DOSE,) 2 MG/1.5ML SOPN   simvastatin (ZOCOR) 40 MG tablet   CKD stage 3 due to type 2 diabetes mellitus (HCC)   Relevant Medications   lisinopril (ZESTRIL) 10 MG tablet   Semaglutide, 1 MG/DOSE, (OZEMPIC, 1 MG/DOSE,) 2 MG/1.5ML SOPN   simvastatin (ZOCOR) 40 MG tablet   Other Relevant Orders   CBC with Differential/Platelet   CMP14+EGFR   Lipid panel   Bayer DCA Hb A1c Waived (Completed)   Hyperlipidemia associated with type 2 diabetes mellitus (HCC)   Relevant Medications   lisinopril (ZESTRIL) 10 MG tablet   Semaglutide, 1 MG/DOSE, (OZEMPIC, 1 MG/DOSE,) 2 MG/1.5ML SOPN   simvastatin (  ZOCOR) 40 MG tablet   Other Visit Diagnoses     Type 2 diabetes mellitus with hyperglycemia, with long-term current use of insulin (HCC)    -  Primary   Relevant Medications    lisinopril (ZESTRIL) 10 MG tablet   Semaglutide, 1 MG/DOSE, (OZEMPIC, 1 MG/DOSE,) 2 MG/1.5ML SOPN   simvastatin (ZOCOR) 40 MG tablet   Other Relevant Orders   CBC with Differential/Platelet   CMP14+EGFR   Lipid panel   Bayer DCA Hb A1c Waived (Completed)       Continue current medicine, A1c looks good at 5.7.   Follow up plan: Return in about 3 months (around 07/18/2021), or if symptoms worsen or fail to improve, for Diabetes and hypertension cholesterol.  Counseling provided for all of the vaccine components Orders Placed This Encounter  Procedures   CBC with Differential/Platelet   CMP14+EGFR   Lipid panel   Bayer DCA Hb A1c [place], MD [facility] 04/19/2021, 11:47 AM

## 2021-04-26 ENCOUNTER — Telehealth: Payer: Medicare Other

## 2021-05-06 DIAGNOSIS — E1169 Type 2 diabetes mellitus with other specified complication: Secondary | ICD-10-CM | POA: Diagnosis not present

## 2021-05-09 DIAGNOSIS — E875 Hyperkalemia: Secondary | ICD-10-CM | POA: Diagnosis not present

## 2021-05-09 DIAGNOSIS — R809 Proteinuria, unspecified: Secondary | ICD-10-CM | POA: Diagnosis not present

## 2021-05-09 DIAGNOSIS — E1122 Type 2 diabetes mellitus with diabetic chronic kidney disease: Secondary | ICD-10-CM | POA: Diagnosis not present

## 2021-05-09 DIAGNOSIS — D631 Anemia in chronic kidney disease: Secondary | ICD-10-CM | POA: Diagnosis not present

## 2021-05-09 DIAGNOSIS — E1129 Type 2 diabetes mellitus with other diabetic kidney complication: Secondary | ICD-10-CM | POA: Diagnosis not present

## 2021-05-09 DIAGNOSIS — I9589 Other hypotension: Secondary | ICD-10-CM | POA: Diagnosis not present

## 2021-05-09 DIAGNOSIS — N189 Chronic kidney disease, unspecified: Secondary | ICD-10-CM | POA: Diagnosis not present

## 2021-05-20 ENCOUNTER — Encounter (HOSPITAL_COMMUNITY): Payer: Self-pay | Admitting: Emergency Medicine

## 2021-05-20 ENCOUNTER — Ambulatory Visit (INDEPENDENT_AMBULATORY_CARE_PROVIDER_SITE_OTHER): Payer: Medicare Other

## 2021-05-20 ENCOUNTER — Other Ambulatory Visit: Payer: Self-pay

## 2021-05-20 ENCOUNTER — Emergency Department (HOSPITAL_COMMUNITY)
Admission: EM | Admit: 2021-05-20 | Discharge: 2021-05-25 | Disposition: A | Discharge status: Home / Self Care | Payer: Medicare Other | Attending: Emergency Medicine | Admitting: Emergency Medicine

## 2021-05-20 VITALS — Wt 178.0 lb

## 2021-05-20 DIAGNOSIS — F329 Major depressive disorder, single episode, unspecified: Secondary | ICD-10-CM | POA: Insufficient documentation

## 2021-05-20 DIAGNOSIS — R45851 Suicidal ideations: Secondary | ICD-10-CM | POA: Diagnosis not present

## 2021-05-20 DIAGNOSIS — I10 Essential (primary) hypertension: Secondary | ICD-10-CM | POA: Diagnosis not present

## 2021-05-20 DIAGNOSIS — Z79899 Other long term (current) drug therapy: Secondary | ICD-10-CM | POA: Diagnosis not present

## 2021-05-20 DIAGNOSIS — Z23 Encounter for immunization: Secondary | ICD-10-CM | POA: Insufficient documentation

## 2021-05-20 DIAGNOSIS — E119 Type 2 diabetes mellitus without complications: Secondary | ICD-10-CM | POA: Diagnosis not present

## 2021-05-20 DIAGNOSIS — Z0000 Encounter for general adult medical examination without abnormal findings: Secondary | ICD-10-CM | POA: Diagnosis not present

## 2021-05-20 DIAGNOSIS — Z794 Long term (current) use of insulin: Secondary | ICD-10-CM | POA: Diagnosis not present

## 2021-05-20 LAB — CBC
HCT: 37.6 % — ABNORMAL LOW (ref 39.0–52.0)
Hemoglobin: 12.4 g/dL — ABNORMAL LOW (ref 13.0–17.0)
MCH: 32.8 pg (ref 26.0–34.0)
MCHC: 33 g/dL (ref 30.0–36.0)
MCV: 99.5 fL (ref 80.0–100.0)
Platelets: 228 10*3/uL (ref 150–400)
RBC: 3.78 MIL/uL — ABNORMAL LOW (ref 4.22–5.81)
RDW: 12.5 % (ref 11.5–15.5)
WBC: 7.1 10*3/uL (ref 4.0–10.5)
nRBC: 0 % (ref 0.0–0.2)

## 2021-05-20 LAB — ACETAMINOPHEN LEVEL: Acetaminophen (Tylenol), Serum: <10 ug/mL — ABNORMAL LOW (ref 10–30)

## 2021-05-20 LAB — COMPREHENSIVE METABOLIC PANEL
ALT: 15 U/L (ref 0–44)
AST: 21 U/L (ref 15–41)
Albumin: 4 g/dL (ref 3.5–5.0)
Alkaline Phosphatase: 56 U/L (ref 38–126)
Anion gap: 12 (ref 5–15)
BUN: 44 mg/dL — ABNORMAL HIGH (ref 8–23)
CO2: 20 mmol/L — ABNORMAL LOW (ref 22–32)
Calcium: 9.3 mg/dL (ref 8.9–10.3)
Chloride: 107 mmol/L (ref 98–111)
Creatinine, Ser: 2.53 mg/dL — ABNORMAL HIGH (ref 0.61–1.24)
GFR, Estimated: 28 mL/min — ABNORMAL LOW (ref >60)
Glucose, Bld: 195 mg/dL — ABNORMAL HIGH (ref 70–99)
Potassium: 4.7 mmol/L (ref 3.5–5.1)
Sodium: 139 mmol/L (ref 135–145)
Total Bilirubin: 0.4 mg/dL (ref 0.3–1.2)
Total Protein: 7 g/dL (ref 6.5–8.1)

## 2021-05-20 LAB — SALICYLATE LEVEL: Salicylate Lvl: <7 mg/dL — ABNORMAL LOW (ref 7.0–30.0)

## 2021-05-20 LAB — ETHANOL: Alcohol, Ethyl (B): <10 mg/dL (ref <10)

## 2021-05-20 LAB — CBG MONITORING, ED: Glucose-Capillary: 191 mg/dL — ABNORMAL HIGH (ref 70–99)

## 2021-05-20 MED ORDER — TETANUS-DIPHTH-ACELL PERTUSSIS 5-2.5-18.5 LF-MCG/0.5 IM SUSY
0.5000 mL | PREFILLED_SYRINGE | Freq: Once | INTRAMUSCULAR | Status: AC
  Administered 2021-05-20: 0.5 mL via INTRAMUSCULAR
  Filled 2021-05-20: qty 0.5

## 2021-05-20 MED ORDER — INSULIN ASPART 100 UNIT/ML IJ SOLN
3.0000 [IU] | Freq: Three times a day (TID) | INTRAMUSCULAR | Status: DC
  Filled 2021-05-20: qty 0.06

## 2021-05-20 MED ORDER — SIMVASTATIN 10 MG PO TABS
40.0000 mg | ORAL_TABLET | Freq: Every day | ORAL | Status: DC
  Administered 2021-05-21 – 2021-05-25 (×5): 40 mg via ORAL
  Filled 2021-05-20 (×5): qty 4

## 2021-05-20 MED ORDER — LISINOPRIL 10 MG PO TABS
10.0000 mg | ORAL_TABLET | Freq: Every day | ORAL | Status: DC
  Administered 2021-05-21 – 2021-05-25 (×5): 10 mg via ORAL
  Filled 2021-05-20 (×5): qty 1

## 2021-05-20 NOTE — ED Provider Notes (Signed)
[facility] EMERGENCY DEPARTMENT Provider Note   CSN: [id number] Arrival date & time: 05/20/21  2006     History  Chief Complaint  Patient presents with   V70.1    David Zamora is a 61 y.o. male.  HPI  Patient with medical history including diabetes, mental delay, hypertension, tremors drug-induced presents to the emergency department from [facility] under [facility] IVC as patient was trying to harm himself.  Patient states that he has been having suicidal ideations for a long time but started today he could not take it any longer and he wanted to kill himself, so he took some thumbtacks and  was stabbing his arms bilaterally, he denies any homicidal ideations denies any hallucinations or delusions, states he has been taking his home medication as prescribed, denies any illicit drug use, he has no other complaints, does not endorse fevers chills chest pain shortness of breath general body aches, he is not immunocompromise, not up-to-date on his tetanus shot.  Has no other complaints.   Home Medications Prior to Admission medications   Medication Sig Start Date End Date Taking? Authorizing Provider  ATHLETES FOOT SPRAY 1 % AERO APPLY TOPICALLY 2 TIMES A DAY. 05/08/20   Dettinger, Fransisca Kaufmann, MD  blood glucose meter kit and supplies KIT Dispense based on patient and insurance preference. Use up to four times daily as directed. (FOR ICD-9 250.00, 250.01). 06/18/16   Dettinger, Fransisca Kaufmann, MD  blood glucose meter kit and supplies Dispense based on patient and insurance preference. Use up to four times daily as directed. (FOR ICD-10 E10.9, E11.9). 12/10/20   Baruch Gouty, FNP  buPROPion (WELLBUTRIN XL) 150 MG 24 hr tablet Take 150 mg by mouth every morning.    [provider]  busPIRone (BUSPAR) 10 MG tablet Take 10 mg by mouth 2 (two) times daily.    [provider]  Carboxymeth-Glycerin-Polysorb 0.5-1-0.5 % SOLN Apply 1 drop to eye 4 (four) times daily.    [provider]  divalproex (DEPAKOTE ER) 250 MG 24 hr tablet Take 250-1,000 mg by mouth See admin instructions. 224m in the morning and 10074m at bedtime    [provider]  Emollient (CERAVE) CREA APPLY 1 APPLICATION ONCE A DAY TO BOTH FEET 08/30/20   Dettinger, JoFransisca KaufmannMD  FARXIGA 10 MG TABS tablet Take 10 mg by mouth daily. Patient not taking: Reported on 05/20/2021 01/12/21   [provider]  fluticasone (FLONASE) 50 MCG/ACT nasal spray 1 SPRAY EACH NOSTRIL TWICE DAILY AS NEEDED FOR ALLERGIES OR RHINITIS. Patient not taking: Reported on 05/20/2021 04/19/21   Dettinger, JoFransisca KaufmannMD  FORA Lancets MISC USE TO TEST BLOOD SUGAR 3 TIMES A DAY USE TO TEST BLOOD SUGAR AS NEEDED FOR SIGNS/SYMPTOMS OF HYPOGLYCEMIA (SHAKINESS, SWEATING, BLURRED VIS 09/28/18   Dettinger, JoFransisca KaufmannMD  furosemide (LASIX) 20 MG tablet TAKE 1 TABLET BY MOUTH ONCE DAILY AS NEEDED FOR WEIGHT GAIN GREATER THAN 3 LBS 03/26/20   Dettinger, JoFransisca KaufmannMD  GLOBAL EASE INJECT PEN NEEDLES 31G X 5 MM MISC SMARTSIG:Syringe(s) SUB-Q 12/31/20   [provider]  glucose blood (FORA V30A BLOOD GLUCOSE TEST) test strip USE TO TEST BLOOD SUGAR 3 TIMES A DAY AS NEEDED FOR SIGNS/ SYMPTOMS OF HYPOGLYCEMIA SHAKINESS, SWEATING, BLURRED VISION. Dx E11.9 11/10/18   Dettinger, JoFransisca KaufmannMD  glucose monitoring kit (FREESTYLE) monitoring kit 1 each by Does not apply route as needed for other. 02/13/21   Dettinger, JoFransisca Kaufmann  MD  ibuprofen (MOTRIN IB) 200 MG tablet Take 2 tablets (400 mg total) by mouth daily. 09/22/19   Dettinger, Fransisca Kaufmann, MD  insulin aspart (NOVOLOG FLEXPEN) 100 UNIT/ML FlexPen Inject 3-6 Units into the skin 3 (three) times daily with meals. If glucose is above 90 and he is eating.  Follow sliding scale if glucose is above 150 for additional insulin.  If glucose 151-250 give additional 1 unit for total of 5 units with meal; if glucose 251-350 give additional 2 units for total of 6 units with meal; if glucose 351 or higher  give additional 3 units for total of 7 units with meals. 10/10/20   Brita Romp, NP  insulin degludec (TRESIBA) 100 UNIT/ML FlexTouch Pen Inject 15 Units into the skin at bedtime. 07/17/20   Brita Romp, NP  Insulin Pen Needle (TRUEPLUS PEN NEEDLES) 31G X 8 MM MISC Use 1 pen needle to administer insulin 4 times daily 02/13/21   Dettinger, Fransisca Kaufmann, MD  lisinopril (ZESTRIL) 10 MG tablet TAKE (1) TABLET BY MOUTH ONCE DAILY FOR PROTEINURIA 04/19/21   Dettinger, Fransisca Kaufmann, MD  loperamide (IMODIUM A-D) 2 MG tablet Take 1 tablet (2 mg total) by mouth 4 (four) times daily as needed for diarrhea or loose stools. Patient not taking: Reported on 05/20/2021 05/07/20   Dettinger, Fransisca Kaufmann, MD  meclizine (ANTIVERT) 12.5 MG tablet Take 1 tablet (12.5 mg total) by mouth 3 (three) times daily as needed for dizziness. Patient not taking: Reported on 05/20/2021 08/15/20   Ivy Lynn, NP  omeprazole (PRILOSEC) 40 MG capsule Take 1 capsule (40 mg total) by mouth daily. 11/29/20   Dettinger, Fransisca Kaufmann, MD  PAIN RELIEF EXTRA STRENGTH 500 MG tablet Take by mouth. 03/06/21   [provider]  PARoxetine (PAXIL) 30 MG tablet Take by mouth. 01/15/21   [provider]  Phenylephrine-DM-GG (TUSSIN CF PO) Take 5 mLs by mouth 4 (four) times daily as needed. Patient not taking: Reported on 05/20/2021    [provider]  QUEtiapine (SEROQUEL XR) 300 MG 24 hr tablet Take 300 mg by mouth daily. Taken daily at 4pm Patient not taking: Reported on 05/20/2021    [provider]  QUEtiapine (SEROQUEL XR) 50 MG TB24 24 hr tablet Take by mouth. 11/13/20   [provider]  RESTASIS 0.05 % ophthalmic emulsion  12/20/20   [provider]  Semaglutide, 1 MG/DOSE, (OZEMPIC, 1 MG/DOSE,) 2 MG/1.5ML SOPN Inject 1 mg into the skin once a week. Start 1 week after you finish 0.65m pen.  Discontinue 0.51mOzempic 04/19/21   Dettinger, JoFransisca KaufmannMD  simvastatin (ZOCOR) 40 MG tablet One tablet daily 04/19/21    Dettinger, JoFransisca KaufmannMD  sodium zirconium cyclosilicate (LOKELMA) 5 g packet Take by mouth. Patient not taking: Reported on 05/20/2021 05/09/21 07/08/21  [provider]  tamsulosin (FLOMAX) 0.4 MG CAPS capsule TAKE 1 CAPSULE BY MOUTH ONCE A DAY. Patient not taking: Reported on 05/20/2021 06/13/20   Dettinger, JoFransisca KaufmannMD  tolnaftate (TINACTIN) 1 % spray Apply topically 2 (two) times daily. Patient not taking: Reported on 05/20/2021 11/29/18   Dettinger, JoFransisca KaufmannMD  Transparent Dressings (TEGADERM FILM 6"[phone] MISC Use to cover free style libre sensor Patient not taking: Reported on 05/20/2021 09/28/19   Dettinger, JoFransisca KaufmannMD  TRESIBA FLEXTOUCH 100 UNIT/ML FlexTouch Pen Inject into the skin. 11/13/20   [provider]  triamcinolone ointment (KENALOG) 0.5 % APPLY TOPICALLY TWICE DAILY. 08/29/19   Dettinger,  Fransisca Kaufmann, MD  VASCEPA 1 g capsule TAKE (2) CAPSULES BY MOUTH TWICE DAILY. 05/30/19   Dettinger, Fransisca Kaufmann, MD  Vitamin D, Ergocalciferol, (DRISDOL) 1.25 MG (50000 UNIT) CAPS capsule Take 50,000 Units by mouth once a week. 06/12/20   [provider]      Allergies    Soap    Review of Systems   Review of Systems  Constitutional:  Negative for chills and fever.  Respiratory:  Negative for shortness of breath.   Cardiovascular:  Negative for chest pain.  Gastrointestinal:  Negative for abdominal pain.  Neurological:  Negative for headaches.  Psychiatric/Behavioral:  Positive for self-injury and suicidal ideas. Negative for sleep disturbance.    Physical Exam Updated Vital Signs BP 136/75 (BP Location: Right Arm)    Pulse (!) 110    Temp 98.3 F (36.8 C) (Oral)    Resp 16    Ht '5\' 9"'  (1.753 m)    Wt 81.7 kg    SpO2 98%    BMI 26.60 kg/m  Physical Exam Vitals and nursing note reviewed.  Constitutional:      General: He is not in acute distress.    Appearance: He is not ill-appearing.  HENT:     Head: Normocephalic and atraumatic.     Nose: No congestion.  Eyes:      Conjunctiva/sclera: Conjunctivae normal.  Cardiovascular:     Rate and Rhythm: Normal rate and regular rhythm.     Pulses: Normal pulses.  Pulmonary:     Effort: Pulmonary effort is normal.  Skin:    General: Skin is warm and dry.     Comments: Patient has noted abrasions on the anterior aspect of his forearms bilaterally, he also has multiple small puncture wounds, appears to be superficial, hemodynamically stable, no other abnormalities present.  Neurological:     Mental Status: He is alert.     Comments: Alert and oriented x4 no facial asymmetry, no difficulty with word finding,  able to follow two-step commands no unilateral weakness present.  Patient does have noted resting and active tremors bilaterally in the upper extremities.  Psychiatric:        Mood and Affect: Mood normal.     Comments: Endorses suicidal ideations without a plan at this time, denies any homicidal ideation, responding appropriately, does not appear to be responding to internal stimuli.    ED Results / Procedures / Treatments   Labs (all labs ordered are listed, but only abnormal results are displayed) Labs Reviewed  COMPREHENSIVE METABOLIC PANEL - Abnormal; Notable for the following components:      Result Value   CO2 20 (*)    Glucose, Bld 195 (*)    BUN 44 (*)    Creatinine, Ser 2.53 (*)    GFR, Estimated 28 (*)    All other components within normal limits  SALICYLATE LEVEL - Abnormal; Notable for the following components:   Salicylate Lvl <0.0 (*)    All other components within normal limits  ACETAMINOPHEN LEVEL - Abnormal; Notable for the following components:   Acetaminophen (Tylenol), Serum <10 (*)    All other components within normal limits  CBC - Abnormal; Notable for the following components:   RBC 3.78 (*)    Hemoglobin 12.4 (*)    HCT 37.6 (*)    All other components within normal limits  CBG MONITORING, ED - Abnormal; Notable for the following components:   Glucose-Capillary 191 (*)     All other components within  normal limits  ETHANOL  RAPID URINE DRUG SCREEN, HOSP PERFORMED    EKG None  Radiology No results found.  Procedures Procedures    Medications Ordered in ED Medications  insulin aspart (novoLOG) injection 3-6 Units (has no administration in time range)  lisinopril (ZESTRIL) tablet 10 mg (has no administration in time range)  simvastatin (ZOCOR) tablet 40 mg (has no administration in time range)  Tdap (BOOSTRIX) injection 0.5 mL (0.5 mLs Intramuscular Given 05/20/21 2245)    ED Course/ Medical Decision Making/ A&P                           Medical Decision Making Amount and/or Complexity of Data Reviewed Labs: ordered.  Risk Prescription drug management.   This patient presents to the ED for concern of suicidal ideation, this involves an extensive number of treatment options, and is a complaint that carries with it a high risk of complications and morbidity.  The differential diagnosis includes psychiatric emergency, metabolic abnormality, CVA    Additional history obtained:  Additional history obtained from the chart medical record    Co morbidities that complicate the patient evaluation  Intellectual delay  Social Determinants of Health:  Group home    Lab Tests:  I Ordered, and personally interpreted labs.  The pertinent results include: CBC shows normocytic anemia hemoglobin 12.4, CMP shows CO2 of 20, glucose 195 BUN 44 creatinine 2.53 at baseline, ethanol less than 10, citrate level less than 7, acetaminophen less than 10   Imaging Studies ordered:  I ordered imaging studies including N/A   Cardiac Monitoring:  The patient was maintained on a cardiac monitor.  I personally viewed and interpreted the cardiac monitored which showed an underlying rhythm of: Pending   Medicines ordered and prescription drug management:  I ordered medication including home medications  I have reviewed the patients home medicines and  have made adjustments as needed    Reevaluation:  Patient is up-to-date on his tetanus shot we will update this, we will continue to monitor him  Lab work has all come back unremarkable, patient is medically cleared will place in psych hold, will order home medications, and await TTS recommendations.  Consultations Obtained:  TTS will await further recommendations   Rule out Loose vision for metabolic abnormality as he is afebrile, nontachycardic, no leukocytosis, no severe derangements seen in lab work.  Low suspicion for CVA or intracranial head bleed no focal deficit present on exam, presentation more consistent with psychiatric emergency as he has history of this.    Dispostion and problem list  After consideration of the diagnostic results and the patients response to treatment, I feel that the patent would benefit from psych hold  Suicidal ideations-likely acute on chronic crisis, patient was placed on a psych hold, IVC at this time, home meds have been ordered, will await further recommendations from TTS           Final Clinical Impression(s) / ED Diagnoses Final diagnoses:  Suicidal ideation    Rx / DC Orders ED Discharge Orders     None         Marcello Fennel, PA-C 05/20/21 2323    Margette Fast, MD 05/28/21 [phone]
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
## 2021-05-20 NOTE — Patient Instructions (Signed)
David Zamora , Thank you for taking time to come for your Medicare Wellness Visit. I appreciate your ongoing commitment to your health goals. Please review the following plan we discussed and let me know if I can assist you in the future.   Screening recommendations/referrals: Colonoscopy: Cologuard done 04/23/2020 - repeat in 3 years Recommended yearly ophthalmology/optometry visit for glaucoma screening and checkup Recommended yearly dental visit for hygiene and checkup  Vaccinations: Influenza vaccine: Done 12/28/2020 - Repeat annually Pneumococcal vaccine: Done 07/22/2016 & 05/06/2018 - no repeats Tdap vaccine: Done 04/13/2011 - Repeat in 10 years *past due Shingles vaccine: Done 11/29/2020 - due for second dose now   Covid-19: Done 06/20/2019, 07/19/2019, & 02/24/2020 - check with pharmacy for additional boosters  Advanced directives: Please bring a copy of your health care power of attorney and living will to the office to be added to your chart at your convenience.   Conditions/risks identified: Aim for 30 minutes of exercise or brisk walking each day, drink 6-8 glasses of water and eat lots of fruits and vegetables.   Next appointment: Follow up in one year for your annual wellness visit   Preventive Care 40-64 Years, Male Preventive care refers to lifestyle choices and visits with your health care provider that can promote health and wellness. What does preventive care include? A yearly physical exam. This is also called an annual well check. Dental exams once or twice a year. Routine eye exams. Ask your health care provider how often you should have your eyes checked. Personal lifestyle choices, including: Daily care of your teeth and gums. Regular physical activity. Eating a healthy diet. Avoiding tobacco and drug use. Limiting alcohol use. Practicing safe sex. Taking low-dose aspirin every day starting at age 52. What happens during an annual well check? The services and  screenings done by your health care provider during your annual well check will depend on your age, overall health, lifestyle risk factors, and family history of disease. Counseling  Your health care provider may ask you questions about your: Alcohol use. Tobacco use. Drug use. Emotional well-being. Home and relationship well-being. Sexual activity. Eating habits. Work and work Statistician. Screening  You may have the following tests or measurements: Height, weight, and BMI. Blood pressure. Lipid and cholesterol levels. These may be checked every 5 years, or more frequently if you are over 109 years old. Skin check. Lung cancer screening. You may have this screening every year starting at age 16 if you have a 30-pack-year history of smoking and currently smoke or have quit within the past 15 years. Fecal occult blood test (FOBT) of the stool. You may have this test every year starting at age 77. Flexible sigmoidoscopy or colonoscopy. You may have a sigmoidoscopy every 5 years or a colonoscopy every 10 years starting at age 23. Prostate cancer screening. Recommendations will vary depending on your family history and other risks. Hepatitis C blood test. Hepatitis B blood test. Sexually transmitted disease (STD) testing. Diabetes screening. This is done by checking your blood sugar (glucose) after you have not eaten for a while (fasting). You may have this done every 1-3 years. Discuss your test results, treatment options, and if necessary, the need for more tests with your health care provider. Vaccines  Your health care provider may recommend certain vaccines, such as: Influenza vaccine. This is recommended every year. Tetanus, diphtheria, and acellular pertussis (Tdap, Td) vaccine. You may need a Td booster every 10 years. Zoster vaccine. You may need this  after age 12. Pneumococcal 13-valent conjugate (PCV13) vaccine. You may need this if you have certain conditions and have not been  vaccinated. Pneumococcal polysaccharide (PPSV23) vaccine. You may need one or two doses if you smoke cigarettes or if you have certain conditions. Talk to your health care provider about which screenings and vaccines you need and how often you need them. This information is not intended to replace advice given to you by your health care provider. Make sure you discuss any questions you have with your health care provider. Document Released: 04/27/2015 Document Revised: 12/19/2015 Document Reviewed: 01/30/2015 Elsevier Interactive Patient Education  2017 [place] Prevention in the Home Falls can cause injuries. They can happen to people of all ages. There are many things you can do to make your home safe and to help prevent falls. What can I do on the outside of my home? Regularly fix the edges of walkways and driveways and fix any cracks. Remove anything that might make you trip as you walk through a door, such as a raised step or threshold. Trim any bushes or trees on the path to your home. Use bright outdoor lighting. Clear any walking paths of anything that might make someone trip, such as rocks or tools. Regularly check to see if handrails are loose or broken. Make sure that both sides of any steps have handrails. Any raised decks and porches should have guardrails on the edges. Have any leaves, snow, or ice cleared regularly. Use sand or salt on walking paths during winter. Clean up any spills in your garage right away. This includes oil or grease spills. What can I do in the bathroom? Use night lights. Install grab bars by the toilet and in the tub and shower. Do not use towel bars as grab bars. Use non-skid mats or decals in the tub or shower. If you need to sit down in the shower, use a plastic, non-slip stool. Keep the floor dry. Clean up any water that spills on the floor as soon as it happens. Remove soap buildup in the tub or shower regularly. Attach bath mats  securely with double-sided non-slip rug tape. Do not have throw rugs and other things on the floor that can make you trip. What can I do in the bedroom? Use night lights. Make sure that you have a light by your bed that is easy to reach. Do not use any sheets or blankets that are too big for your bed. They should not hang down onto the floor. Have a firm chair that has side arms. You can use this for support while you get dressed. Do not have throw rugs and other things on the floor that can make you trip. What can I do in the kitchen? Clean up any spills right away. Avoid walking on wet floors. Keep items that you use a lot in easy-to-reach places. If you need to reach something above you, use a strong step stool that has a grab bar. Keep electrical cords out of the way. Do not use floor polish or wax that makes floors slippery. If you must use wax, use non-skid floor wax. Do not have throw rugs and other things on the floor that can make you trip. What can I do with my stairs? Do not leave any items on the stairs. Make sure that there are handrails on both sides of the stairs and use them. Fix handrails that are broken or loose. Make sure that handrails are as  long as the stairways. Check any carpeting to make sure that it is firmly attached to the stairs. Fix any carpet that is loose or worn. Avoid having throw rugs at the top or bottom of the stairs. If you do have throw rugs, attach them to the floor with carpet tape. Make sure that you have a light switch at the top of the stairs and the bottom of the stairs. If you do not have them, ask someone to add them for you. What else can I do to help prevent falls? Wear shoes that: Do not have high heels. Have rubber bottoms. Are comfortable and fit you well. Are closed at the toe. Do not wear sandals. If you use a stepladder: Make sure that it is fully opened. Do not climb a closed stepladder. Make sure that both sides of the stepladder  are locked into place. Ask someone to hold it for you, if possible. Clearly mark and make sure that you can see: Any grab bars or handrails. First and last steps. Where the edge of each step is. Use tools that help you move around (mobility aids) if they are needed. These include: Canes. Walkers. Scooters. Crutches. Turn on the lights when you go into a dark area. Replace any light bulbs as soon as they burn out. Set up your furniture so you have a clear path. Avoid moving your furniture around. If any of your floors are uneven, fix them. If there are any pets around you, be aware of where they are. Review your medicines with your doctor. Some medicines can make you feel dizzy. This can increase your chance of falling. Ask your doctor what other things that you can do to help prevent falls. This information is not intended to replace advice given to you by your health care provider. Make sure you discuss any questions you have with your health care provider. Document Released: 01/25/2009 Document Revised: 09/06/2015 Document Reviewed: 05/05/2014 Elsevier Interactive Patient Education  2017 [employer].

## 2021-05-21 DIAGNOSIS — F329 Major depressive disorder, single episode, unspecified: Secondary | ICD-10-CM | POA: Diagnosis not present

## 2021-05-21 LAB — RAPID URINE DRUG SCREEN, HOSP PERFORMED
Amphetamines: NOT DETECTED
Barbiturates: NOT DETECTED
Benzodiazepines: NOT DETECTED
Cocaine: NOT DETECTED
Opiates: NOT DETECTED
Tetrahydrocannabinol: NOT DETECTED

## 2021-05-21 LAB — CBG MONITORING, ED
Glucose-Capillary: 129 mg/dL — ABNORMAL HIGH (ref 70–99)
Glucose-Capillary: 140 mg/dL — ABNORMAL HIGH (ref 70–99)
Glucose-Capillary: 158 mg/dL — ABNORMAL HIGH (ref 70–99)
Glucose-Capillary: 164 mg/dL — ABNORMAL HIGH (ref 70–99)

## 2021-05-21 MED ORDER — INSULIN ASPART 100 UNIT/ML IJ SOLN
0.0000 [IU] | Freq: Three times a day (TID) | INTRAMUSCULAR | Status: DC
  Administered 2021-05-21 – 2021-05-22 (×2): 1 [IU] via SUBCUTANEOUS
  Administered 2021-05-22 – 2021-05-24 (×6): 2 [IU] via SUBCUTANEOUS
  Administered 2021-05-25: 1 [IU] via SUBCUTANEOUS
  Filled 2021-05-21 (×8): qty 1

## 2021-05-21 NOTE — Progress Notes (Signed)
[name] requested that Buckner, RN review pt due to pt being recommended for inpatient behavioral health placement per [name], NP.    Benjaman Kindler, MSW, [facility] 05/21/2021 9:46 PM
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
## 2021-05-21 NOTE — ED Notes (Signed)
Washed patients cuts on left and right arm with sterile water. Pt has small abrasion on left arm that I rewrapped with bandage. Pt provided with warm blanket.

## 2021-05-21 NOTE — ED Notes (Addendum)
Call from [name], [place], guardian, made aware of plan to find inpatient placement.  [phone]  Also requested updated MAR from group home, agreed to fax a copy.   contact for group home manager Burnard Leigh [phone]

## 2021-05-21 NOTE — Progress Notes (Addendum)
Inpatient Behavioral Health Placement  Pt meets inpatient criteria per ,[name], NP. There are no appropriate beds at [facility].Referral was sent to the following facilities;   [name] requested to that [facility] review pt for inpatient behavioral health placement. Destination Service Provider Address Phone Fax  [place] [place] [address] [phone]  [place], [place] [address] [place]  [facility]  [address] [place] [address] [phone] [facility]  [place], [place] [address] [phone]  [facility]  [place], [place] [address] [phone]  [facility]  [address] [place] [address] [phone] [facility]  [place] [address] [place] [address] [facility]  [address] [place] [address] [phone]  [facility] [place] [facility] [facility]  [address] [place] [address] [phone] [place]  [address], [place] [address] [phone] [facility]  [address] [place] [address] [phone] [place]  [address] [place] [address] [phone] [facility]  [address] [place] [address] [place]  [facility]  [place], [place] [address] [phone]  [facility]  [address] [place] [address] [place]  [address], [place] [address] [phone]   [facility]  [address] [place] [address] [phone]  [facility]  [place], [place] [address] [phone]    Situation ongoing,  [name] will follow up.   Benjaman Kindler, MSW, [facility] : [facility] 05/21/2021  @ 11:30 PM

## 2021-05-22 DIAGNOSIS — F329 Major depressive disorder, single episode, unspecified: Secondary | ICD-10-CM | POA: Diagnosis not present

## 2021-05-22 DIAGNOSIS — F32A Depression, unspecified: Secondary | ICD-10-CM

## 2021-05-22 LAB — CBG MONITORING, ED
Glucose-Capillary: 177 mg/dL — ABNORMAL HIGH (ref 70–99)
Glucose-Capillary: 151 mg/dL — ABNORMAL HIGH (ref 70–99)
Glucose-Capillary: 232 mg/dL — ABNORMAL HIGH (ref 70–99)

## 2021-05-22 LAB — HEMOGLOBIN A1C
Hgb A1c MFr Bld: 6.1 % — ABNORMAL HIGH (ref 4.8–5.6)
Mean Plasma Glucose: 128 mg/dL

## 2021-05-22 NOTE — ED Notes (Signed)
Pt calm and cooperative sitting on edge of bed

## 2021-05-22 NOTE — Progress Notes (Addendum)
[name] informed the following information per provider Ethelene Hal, NP:  Provider Ethelene Hal, NP informed pt's guardian that pt is psych and ready for discharge via phone [name] who is pt guardian [phone].  Provider Ethelene Hal, NP spoke with Hester Mates [phone] [place] who informed provider that there will be an clinical evaluation by group home staff to determine which [place] pt can residence at due to pt unable to safety return to pervious group home. Pt is psych cleared however awaiting determination of what group home pt will transition to pending meeting on 05/23/21 at 11:30am   Benjaman Kindler, MSW, [facility] 05/22/2021 7:16 PM
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
## 2021-05-22 NOTE — ED Notes (Signed)
Still awaiting info on dc for patient. Jinny Blossom NP messaged again

## 2021-05-22 NOTE — ED Notes (Signed)
Patient to be discharged back to group home.  David Zamora is aware and will notified of pick up time as well as guardian [place].

## 2021-05-23 DIAGNOSIS — F329 Major depressive disorder, single episode, unspecified: Secondary | ICD-10-CM | POA: Diagnosis not present

## 2021-05-23 LAB — VALPROIC ACID LEVEL: Valproic Acid Lvl: <10 ug/mL — ABNORMAL LOW (ref 50.0–100.0)

## 2021-05-23 LAB — CBG MONITORING, ED
Glucose-Capillary: 132 mg/dL — ABNORMAL HIGH (ref 70–99)
Glucose-Capillary: 243 mg/dL — ABNORMAL HIGH (ref 70–99)
Glucose-Capillary: 217 mg/dL — ABNORMAL HIGH (ref 70–99)

## 2021-05-23 MED ORDER — DIVALPROEX SODIUM 250 MG PO DR TAB
250.0000 mg | DELAYED_RELEASE_TABLET | Freq: Every day | ORAL | Status: DC
  Administered 2021-05-24 – 2021-05-25 (×2): 250 mg via ORAL
  Filled 2021-05-23 (×2): qty 1

## 2021-05-23 MED ORDER — PAROXETINE HCL 20 MG PO TABS
30.0000 mg | ORAL_TABLET | Freq: Every day | ORAL | Status: DC
  Administered 2021-05-23 – 2021-05-25 (×3): 30 mg via ORAL
  Filled 2021-05-23 (×3): qty 1

## 2021-05-23 MED ORDER — BUPROPION HCL ER (XL) 150 MG PO TB24
150.0000 mg | ORAL_TABLET | Freq: Every day | ORAL | Status: DC
  Administered 2021-05-23 – 2021-05-25 (×3): 150 mg via ORAL
  Filled 2021-05-23 (×3): qty 1

## 2021-05-23 MED ORDER — DIVALPROEX SODIUM 250 MG PO DR TAB
1000.0000 mg | DELAYED_RELEASE_TABLET | Freq: Every day | ORAL | Status: DC
  Administered 2021-05-23 – 2021-05-24 (×2): 1000 mg via ORAL
  Filled 2021-05-23 (×2): qty 4

## 2021-05-23 NOTE — Progress Notes (Signed)
Patient ID: David Zamora, male   DOB: [birth year], 61 y.o.   MRN: [id number]  This provider did hear back from [facility] at 4:40pm. She stated they came to the ED for a clinical evaluation. She stated the patient was remorseful for what he did. He told them he is tired of living as a diabetic and often feels like he can't have special treats because of this. The group home has reached out to [facility] for additional money to put staff through crisis and de-escalation training. This will take place tomorrow. They will have an extra staff member in place starting Saturday. [name] will be able to return to the group home he came from, he has lived there for 10 years. Their hope is to have the support and training in place to give him the support he needs to be successful and stay out of the hospital.   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 She is also guardian to patient's mother who is IDD. They have been unable to acquire any of [name]'s records of IQ testing. This provider discussed with Lenna Sciara that patient would not be a candidate for an inpatient hospitalization due to his inability to program. Lenna Sciara stated they were taken back yesterday when he was psychiatrically clear due to the fact they were told he would be recommended for inpatient hospitalization after the initial assessment by TTS. The group home believed they would have time to put these measures in place while he was hospitalized.

## 2021-05-23 NOTE — ED Notes (Signed)
Meal given to pt.

## 2021-05-23 NOTE — ED Notes (Signed)
TOC consulted for assistance in reaching out to legal guardian and Hester Mates [phone] about d/c plan for pt. [name] contacted Latoya, [name] left VM asking for return call. [name] also reached out to Legal guardian [name], VM left requesting return call. TOC to follow.

## 2021-05-23 NOTE — ED Notes (Signed)
Attempt to call to set up tele-sitter. Was told to call back in 15 minutes.

## 2021-05-23 NOTE — ED Notes (Signed)
Pt moved to ED 09 around 0300. Pt remains calm and cooperative. Has no complaints.

## 2021-05-23 NOTE — ED Notes (Addendum)
Pt care taken, wanted coffee and graham crackers, told him that it was not time for a snack and he said to let him know when he could have it. No other complaints. Tele sitter at bedside

## 2021-05-23 NOTE — Progress Notes (Signed)
Per Ethelene Hal, NP pt has been psych cleared. This [name] will now remove pt from [facility] shift report. TOC will assist and follow with discharge needs.  Benjaman Kindler, MSW, [facility] 05/23/2021 11:24 PM
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
## 2021-05-24 DIAGNOSIS — F329 Major depressive disorder, single episode, unspecified: Secondary | ICD-10-CM | POA: Diagnosis not present

## 2021-05-24 LAB — CBG MONITORING, ED
Glucose-Capillary: 211 mg/dL — ABNORMAL HIGH (ref 70–99)
Glucose-Capillary: 231 mg/dL — ABNORMAL HIGH (ref 70–99)
Glucose-Capillary: 242 mg/dL — ABNORMAL HIGH (ref 70–99)

## 2021-05-24 NOTE — ED Notes (Signed)
Unable to get vitals this morning. Pt was masturbating. NAD

## 2021-05-24 NOTE — ED Notes (Signed)
Pt resting no complaints at this time. 

## 2021-05-25 DIAGNOSIS — F329 Major depressive disorder, single episode, unspecified: Secondary | ICD-10-CM | POA: Diagnosis not present

## 2021-05-25 LAB — CBG MONITORING, ED: Glucose-Capillary: 176 mg/dL — ABNORMAL HIGH (ref 70–99)

## 2021-06-13 ENCOUNTER — Ambulatory Visit: Payer: Medicare Other | Admitting: Nurse Practitioner

## 2021-06-13 DIAGNOSIS — H5213 Myopia, bilateral: Secondary | ICD-10-CM | POA: Diagnosis not present

## 2021-06-13 DIAGNOSIS — H5203 Hypermetropia, bilateral: Secondary | ICD-10-CM | POA: Diagnosis not present

## 2021-06-13 DIAGNOSIS — H40033 Anatomical narrow angle, bilateral: Secondary | ICD-10-CM | POA: Diagnosis not present

## 2021-06-17 ENCOUNTER — Telehealth: Payer: Commercial Managed Care - HMO

## 2021-06-19 ENCOUNTER — Telehealth: Payer: Self-pay | Admitting: Family Medicine

## 2021-06-19 NOTE — Telephone Encounter (Signed)
I signed on a form, do they need to send [place] that form again so I can recite it or do they just want and all new prescription ?

## 2021-06-19 NOTE — Telephone Encounter (Signed)
David Zamora called from [facility] stating that the Rx that was corrected and resent to them the other day for insurance purposes for patients David Zamora Rx was what they needed to be stated in order for insurance to pay (PRN x12 months) but says when PCP signed and dated it, the insurance is using that date as when the Rx was written so they won't cover it until that is fixed. Needs to have original date that the Rx was prescribed.  ?

## 2021-06-27 ENCOUNTER — Ambulatory Visit: Payer: Medicaid Other | Admitting: Nurse Practitioner

## 2021-07-10 ENCOUNTER — Other Ambulatory Visit: Payer: Self-pay | Admitting: Family Medicine

## 2021-07-16 ENCOUNTER — Telehealth: Payer: Self-pay | Admitting: Family Medicine

## 2021-07-16 ENCOUNTER — Other Ambulatory Visit: Payer: Self-pay | Admitting: Family Medicine

## 2021-07-18 ENCOUNTER — Ambulatory Visit (INDEPENDENT_AMBULATORY_CARE_PROVIDER_SITE_OTHER): Payer: Medicare Other | Admitting: Family Medicine

## 2021-07-18 ENCOUNTER — Encounter: Payer: Self-pay | Admitting: Family Medicine

## 2021-07-18 ENCOUNTER — Other Ambulatory Visit (HOSPITAL_COMMUNITY)
Admission: RE | Admit: 2021-07-18 | Discharge: 2021-07-18 | Disposition: A | Discharge status: Home / Self Care | Payer: Medicare Other | Source: Ambulatory Visit | Attending: Nephrology | Admitting: Nephrology

## 2021-07-18 ENCOUNTER — Ambulatory Visit: Payer: Commercial Managed Care - HMO | Admitting: Family Medicine

## 2021-07-18 DIAGNOSIS — I9589 Other hypotension: Secondary | ICD-10-CM | POA: Diagnosis not present

## 2021-07-18 DIAGNOSIS — E875 Hyperkalemia: Secondary | ICD-10-CM | POA: Diagnosis not present

## 2021-07-18 DIAGNOSIS — Z23 Encounter for immunization: Secondary | ICD-10-CM

## 2021-07-18 DIAGNOSIS — E1122 Type 2 diabetes mellitus with diabetic chronic kidney disease: Secondary | ICD-10-CM | POA: Insufficient documentation

## 2021-07-18 DIAGNOSIS — N189 Chronic kidney disease, unspecified: Secondary | ICD-10-CM | POA: Diagnosis not present

## 2021-07-18 DIAGNOSIS — E1129 Type 2 diabetes mellitus with other diabetic kidney complication: Secondary | ICD-10-CM | POA: Diagnosis not present

## 2021-07-18 DIAGNOSIS — D631 Anemia in chronic kidney disease: Secondary | ICD-10-CM | POA: Diagnosis not present

## 2021-07-18 DIAGNOSIS — R809 Proteinuria, unspecified: Secondary | ICD-10-CM | POA: Insufficient documentation

## 2021-07-18 DIAGNOSIS — E8722 Chronic metabolic acidosis: Secondary | ICD-10-CM | POA: Diagnosis not present

## 2021-07-18 LAB — CBC
HCT: 35 % — ABNORMAL LOW (ref 39.0–52.0)
Hemoglobin: 11.9 g/dL — ABNORMAL LOW (ref 13.0–17.0)
MCH: 33.8 pg (ref 26.0–34.0)
MCHC: 34 g/dL (ref 30.0–36.0)
MCV: 99.4 fL (ref 80.0–100.0)
Platelets: 217 10*3/uL (ref 150–400)
RBC: 3.52 MIL/uL — ABNORMAL LOW (ref 4.22–5.81)
RDW: 12.5 % (ref 11.5–15.5)
WBC: 6.3 10*3/uL (ref 4.0–10.5)
nRBC: 0 % (ref 0.0–0.2)

## 2021-07-18 LAB — RENAL FUNCTION PANEL
Albumin: 3.8 g/dL (ref 3.5–5.0)
Anion gap: 6 (ref 5–15)
BUN: 46 mg/dL — ABNORMAL HIGH (ref 8–23)
CO2: 26 mmol/L (ref 22–32)
Calcium: 8.6 mg/dL — ABNORMAL LOW (ref 8.9–10.3)
Chloride: 108 mmol/L (ref 98–111)
Creatinine, Ser: 2.88 mg/dL — ABNORMAL HIGH (ref 0.61–1.24)
GFR, Estimated: 24 mL/min — ABNORMAL LOW (ref >60)
Glucose, Bld: 121 mg/dL — ABNORMAL HIGH (ref 70–99)
Phosphorus: 3.6 mg/dL (ref 2.5–4.6)
Potassium: 4.8 mmol/L (ref 3.5–5.1)
Sodium: 140 mmol/L (ref 135–145)

## 2021-07-18 LAB — IRON AND TIBC
Iron: 90 ug/dL (ref 45–182)
Saturation Ratios: 24 % (ref 17.9–39.5)
TIBC: 371 ug/dL (ref 250–450)
UIBC: 281 ug/dL

## 2021-07-18 LAB — FERRITIN: Ferritin: 56 ng/mL (ref 24–336)

## 2021-07-18 LAB — T4, FREE: Free T4: 0.62 ng/dL (ref 0.61–1.12)

## 2021-07-18 LAB — CORTISOL-AM, BLOOD: Cortisol - AM: 13.1 ug/dL (ref 6.7–22.6)

## 2021-07-18 LAB — TSH: TSH: 4.933 u[IU]/mL — ABNORMAL HIGH (ref 0.350–4.500)

## 2021-07-18 NOTE — Telephone Encounter (Signed)
No show letter sent to pts home address. ?

## 2021-07-18 NOTE — Progress Notes (Signed)
Seen by nurse for injection ?

## 2021-07-18 NOTE — Telephone Encounter (Signed)
Patient was supposed to come in for a visit and no showed for his visit, I was going to clarify the visit what dose he is on currently, I guess we will have to call and reschedule and/or ask what dose he is ?

## 2021-07-19 LAB — PTH, INTACT AND CALCIUM
Calcium, Total (PTH): 8.6 mg/dL (ref 8.6–10.2)
PTH: 45 pg/mL (ref 15–65)

## 2021-07-26 ENCOUNTER — Telehealth: Payer: Self-pay | Admitting: Family Medicine

## 2021-07-26 NOTE — Telephone Encounter (Signed)
Patient is needing a memory test done and needs to be referred to a neurologist. He has an appt on 5/25 with PCP and there are no openings before. Mickel Baas is wanting to know if there is a way for him to be seen sooner or if he could see someone else for this. Please call back.  ?

## 2021-07-26 NOTE — Telephone Encounter (Signed)
Would you like to see him for this and if so can he be worked in sooner? Please advise ?

## 2021-07-28 NOTE — Telephone Encounter (Signed)
Please place on a wait list and we can see if anything opens up sooner ?

## 2021-07-29 NOTE — Telephone Encounter (Signed)
Can you please help me get patient on waitlist for dettinger ?

## 2021-07-29 NOTE — Telephone Encounter (Signed)
Pt is on waitlist till 09/05/2021 ?

## 2021-07-31 ENCOUNTER — Other Ambulatory Visit: Payer: Self-pay | Admitting: Family Medicine

## 2021-07-31 ENCOUNTER — Other Ambulatory Visit: Payer: Self-pay | Admitting: Nurse Practitioner

## 2021-07-31 DIAGNOSIS — E1169 Type 2 diabetes mellitus with other specified complication: Secondary | ICD-10-CM | POA: Diagnosis not present

## 2021-08-21 ENCOUNTER — Other Ambulatory Visit: Payer: Self-pay | Admitting: Nurse Practitioner

## 2021-08-21 DIAGNOSIS — E1169 Type 2 diabetes mellitus with other specified complication: Secondary | ICD-10-CM

## 2021-08-22 NOTE — Telephone Encounter (Signed)
Pharmacy left voicemail in reference to medication being denied. Tried to contact pt number on file to inform them the medication is denied due to patient not coming to his appts and his last appt here was November. No answer and VM full.  ?

## 2021-08-26 ENCOUNTER — Ambulatory Visit (INDEPENDENT_AMBULATORY_CARE_PROVIDER_SITE_OTHER): Payer: Medicare Other | Admitting: Nurse Practitioner

## 2021-08-26 ENCOUNTER — Other Ambulatory Visit: Payer: Self-pay | Admitting: Nurse Practitioner

## 2021-08-26 ENCOUNTER — Encounter: Payer: Self-pay | Admitting: Nurse Practitioner

## 2021-08-26 VITALS — BP 112/77 | HR 86 | Ht 71.0 in | Wt 192.0 lb

## 2021-08-26 DIAGNOSIS — Z794 Long term (current) use of insulin: Secondary | ICD-10-CM

## 2021-08-26 DIAGNOSIS — E1169 Type 2 diabetes mellitus with other specified complication: Secondary | ICD-10-CM | POA: Diagnosis not present

## 2021-08-26 DIAGNOSIS — E781 Pure hyperglyceridemia: Secondary | ICD-10-CM

## 2021-08-26 DIAGNOSIS — E1122 Type 2 diabetes mellitus with diabetic chronic kidney disease: Secondary | ICD-10-CM | POA: Diagnosis not present

## 2021-08-26 DIAGNOSIS — N184 Chronic kidney disease, stage 4 (severe): Secondary | ICD-10-CM | POA: Diagnosis not present

## 2021-08-26 LAB — POCT GLYCOSYLATED HEMOGLOBIN (HGB A1C): HbA1c, POC (controlled diabetic range): 6 % (ref 0.0–7.0)

## 2021-08-26 MED ORDER — OZEMPIC (1 MG/DOSE) 4 MG/3ML ~~LOC~~ SOPN: 1.0000 mg | PEN_INJECTOR | SUBCUTANEOUS | 3 refills | Status: DC

## 2021-08-26 MED ORDER — TRESIBA FLEXTOUCH 100 UNIT/ML ~~LOC~~ SOPN: 15.0000 [IU] | PEN_INJECTOR | Freq: Every day | SUBCUTANEOUS | 3 refills | Status: DC

## 2021-08-26 MED ORDER — FORA V30A BLOOD GLUCOSE TEST VI STRP: ORAL_STRIP | 3 refills | Status: DC

## 2021-08-26 MED ORDER — NOVOLOG FLEXPEN 100 UNIT/ML ~~LOC~~ SOPN: 3.0000 [IU] | PEN_INJECTOR | Freq: Three times a day (TID) | SUBCUTANEOUS | 3 refills | Status: DC

## 2021-08-26 NOTE — Patient Instructions (Signed)
Diabetes Mellitus and Foot Care Foot care is an important part of your health, especially when you have diabetes. Diabetes may cause you to have problems because of poor blood flow (circulation) to your feet and legs, which can cause your skin to: Become thinner and drier. Break more easily. Heal more slowly. Peel and crack. You may also have nerve damage (neuropathy) in your legs and feet, causing decreased feeling in them. This means that you may not notice minor injuries to your feet that could lead to more serious problems. Noticing and addressing any potential problems early is the best way to prevent future foot problems. How to care for your feet Foot hygiene  Wash your feet daily with warm water and mild soap. Do not use hot water. Then, pat your feet and the areas between your toes until they are completely dry. Do not soak your feet as this can dry your skin. Trim your toenails straight across. Do not dig under them or around the cuticle. File the edges of your nails with an emery board or nail file. Apply a moisturizing lotion or petroleum jelly to the skin on your feet and to dry, brittle toenails. Use lotion that does not contain alcohol and is unscented. Do not apply lotion between your toes. Shoes and socks Wear clean socks or stockings every day. Make sure they are not too tight. Do not wear knee-high stockings since they may decrease blood flow to your legs. Wear shoes that fit properly and have enough cushioning. Always look in your shoes before you put them on to be sure there are no objects inside. To break in new shoes, wear them for just a few hours a day. This prevents injuries on your feet. Wounds, scrapes, corns, and calluses  Check your feet daily for blisters, cuts, bruises, sores, and redness. If you cannot see the bottom of your feet, use a mirror or ask someone for help. Do not cut corns or calluses or try to remove them with medicine. If you find a minor scrape,  cut, or break in the skin on your feet, keep it and the skin around it clean and dry. You may clean these areas with mild soap and water. Do not clean the area with peroxide, alcohol, or iodine. If you have a wound, scrape, corn, or callus on your foot, look at it several times a day to make sure it is healing and not infected. Check for: Redness, swelling, or pain. Fluid or blood. Warmth. Pus or a bad smell. General tips Do not cross your legs. This may decrease blood flow to your feet. Do not use heating pads or hot water bottles on your feet. They may burn your skin. If you have lost feeling in your feet or legs, you may not know this is happening until it is too late. Protect your feet from hot and cold by wearing shoes, such as at the beach or on hot pavement. Schedule a complete foot exam at least once a year (annually) or more often if you have foot problems. Report any cuts, sores, or bruises to your health care provider immediately. Where to find more information American Diabetes Association: www.diabetes.org Association of Diabetes Care & Education Specialists: www.diabeteseducator.org Contact a health care provider if: You have a medical condition that increases your risk of infection and you have any cuts, sores, or bruises on your feet. You have an injury that is not healing. You have redness on your legs or feet. You   feel burning or tingling in your legs or feet. You have pain or cramps in your legs and feet. Your legs or feet are numb. Your feet always feel cold. You have pain around any toenails. Get help right away if: You have a wound, scrape, corn, or callus on your foot and: You have pain, swelling, or redness that gets worse. You have fluid or blood coming from the wound, scrape, corn, or callus. Your wound, scrape, corn, or callus feels warm to the touch. You have pus or a bad smell coming from the wound, scrape, corn, or callus. You have a fever. You have a red  line going up your leg. Summary Check your feet every day for blisters, cuts, bruises, sores, and redness. Apply a moisturizing lotion or petroleum jelly to the skin on your feet and to dry, brittle toenails. Wear shoes that fit properly and have enough cushioning. If you have foot problems, report any cuts, sores, or bruises to your health care provider immediately. Schedule a complete foot exam at least once a year (annually) or more often if you have foot problems. This information is not intended to replace advice given to you by your health care provider. Make sure you discuss any questions you have with your health care provider. Document Revised: 10/20/2019 Document Reviewed: 10/20/2019 Elsevier Patient Education  2023 Elsevier Inc.  
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
## 2021-08-28 ENCOUNTER — Other Ambulatory Visit: Payer: Self-pay | Admitting: Nurse Practitioner

## 2021-08-28 DIAGNOSIS — E1169 Type 2 diabetes mellitus with other specified complication: Secondary | ICD-10-CM

## 2021-08-28 MED ORDER — NOVOLOG FLEXPEN 100 UNIT/ML ~~LOC~~ SOPN: 3.0000 [IU] | PEN_INJECTOR | Freq: Three times a day (TID) | SUBCUTANEOUS | 3 refills | Status: DC

## 2021-09-05 ENCOUNTER — Ambulatory Visit (INDEPENDENT_AMBULATORY_CARE_PROVIDER_SITE_OTHER): Payer: Medicare Other | Admitting: Family Medicine

## 2021-09-05 ENCOUNTER — Encounter: Payer: Self-pay | Admitting: Family Medicine

## 2021-09-05 VITALS — BP 128/82 | HR 93 | Temp 98.1°F | Ht 71.0 in | Wt 197.0 lb

## 2021-09-05 DIAGNOSIS — R634 Abnormal weight loss: Secondary | ICD-10-CM | POA: Diagnosis not present

## 2021-09-05 DIAGNOSIS — I152 Hypertension secondary to endocrine disorders: Secondary | ICD-10-CM | POA: Diagnosis not present

## 2021-09-05 DIAGNOSIS — E1159 Type 2 diabetes mellitus with other circulatory complications: Secondary | ICD-10-CM | POA: Diagnosis not present

## 2021-09-05 DIAGNOSIS — E1122 Type 2 diabetes mellitus with diabetic chronic kidney disease: Secondary | ICD-10-CM

## 2021-09-05 DIAGNOSIS — E785 Hyperlipidemia, unspecified: Secondary | ICD-10-CM | POA: Diagnosis not present

## 2021-09-05 DIAGNOSIS — K219 Gastro-esophageal reflux disease without esophagitis: Secondary | ICD-10-CM | POA: Diagnosis not present

## 2021-09-05 DIAGNOSIS — N183 Chronic kidney disease, stage 3 unspecified: Secondary | ICD-10-CM | POA: Diagnosis not present

## 2021-09-05 DIAGNOSIS — Z794 Long term (current) use of insulin: Secondary | ICD-10-CM

## 2021-09-05 DIAGNOSIS — E1169 Type 2 diabetes mellitus with other specified complication: Secondary | ICD-10-CM | POA: Diagnosis not present

## 2021-09-05 MED ORDER — ICOSAPENT ETHYL 1 G PO CAPS: 2.0000 g | ORAL_CAPSULE | Freq: Two times a day (BID) | ORAL | 3 refills | Status: DC

## 2021-09-05 MED ORDER — OMEPRAZOLE 40 MG PO CPDR: 40.0000 mg | DELAYED_RELEASE_CAPSULE | Freq: Every day | ORAL | 3 refills | Status: DC

## 2021-09-05 MED ORDER — FORA V30A BLOOD GLUCOSE TEST VI STRP: 1.0000 | ORAL_STRIP | Freq: Four times a day (QID) | 3 refills | Status: DC

## 2021-09-05 NOTE — Progress Notes (Signed)
BP 128/82   Pulse 93   Temp 98.1 F (36.7 C)   Ht 5' 11" (1.803 m)   Wt 197 lb (89.4 kg)   SpO2 98%   BMI 27.48 kg/m    Subjective:   Patient ID: David Zamora, male    DOB: 1960/09/04, 61 y.o.   MRN: [id number]  HPI: David Zamora is a 61 y.o. male presenting on 09/05/2021 for Medical Management of Chronic Issues and Diabetes   HPI Type 2 diabetes mellitus, sees Endocrinology now Patient comes in today for recheck of his diabetes. Patient has been currently taking Humalog and Ozempic and [place]. Patient is currently on an ACE inhibitor/ARB. Patient has not seen an ophthalmologist this year. Patient denies any issues with their feet. The symptom started onset as an adult hypertension and CKD 3 and hyperlipidemia ARE RELATED TO DM   Hyperlipidemia Patient is coming in for recheck of his hyperlipidemia. The patient is currently taking simvastatin. They deny any issues with myalgias or history of liver damage from it. They deny any focal numbness or weakness or chest pain.   Hypertension Patient is currently on furosemide and lisinopril, and their blood pressure today is 128/82. Patient denies any lightheadedness or dizziness. Patient denies headaches, blurred vision, chest pains, shortness of breath, or weakness. Denies any side effects from medication and is content with current medication.   Patient has depression and intellectual disorder and sees Dr. Tamera Punt for this through [facility]  Relevant past medical, surgical, family and social history reviewed and updated as indicated. Interim medical history since our last visit reviewed. Allergies and medications reviewed and updated.  Review of Systems  Constitutional:  Negative for chills and fever.  Eyes:  Negative for visual disturbance.  Respiratory:  Negative for shortness of breath and wheezing.   Cardiovascular:  Negative for chest pain and leg swelling.  Musculoskeletal:  Negative for back pain and gait problem.  Skin:   Negative for rash.  Neurological:  Negative for dizziness, weakness and light-headedness.  All other systems reviewed and are negative.  Per HPI unless specifically indicated above   Allergies as of 09/05/2021       Reactions   Soap    White soaps        Medication List        Accurate as of Sep 05, 2021  1:48 PM. If you have any questions, ask your nurse or doctor.          STOP taking these medications    NovoLOG FlexPen 100 UNIT/ML FlexPen Generic drug: insulin aspart Stopped by: Worthy Rancher, MD       TAKE these medications    blood glucose meter kit and supplies Dispense based on patient and insurance preference. Use up to four times daily as directed. (FOR ICD-10 E10.9, E11.9).   blood glucose meter kit and supplies Kit Dispense based on patient and insurance preference. Use up to four times daily as directed. (FOR ICD-9 250.00, 250.01).   buPROPion 150 MG 24 hr tablet Commonly known as: WELLBUTRIN XL Take 150 mg by mouth every morning.   CeraVe Crea APPLY 1 APPLICATION ONCE A DAY TO BOTH FEET   divalproex 250 MG 24 hr tablet Commonly known as: DEPAKOTE ER Take 250-1,000 mg by mouth See admin instructions. 231m in the morning and 10078m at bedtime   fluticasone 50 MCG/ACT nasal spray Commonly known as: FLONASE 1 SPRAY EACH NOSTRIL TWICE DAILY AS NEEDED FOR ALLERGIES OR RHINITIS.  FORA Lancets Misc USE TO TEST BLOOD SUGAR 3 TIMES A DAY USE TO TEST BLOOD SUGAR AS NEEDED FOR SIGNS/SYMPTOMS OF HYPOGLYCEMIA (SHAKINESS, SWEATING, BLURRED VIS   FORA V30a Blood Glucose Test test strip Generic drug: glucose blood 1 each by Other route 4 (four) times daily. USE TO TEST BLOOD SUGAR 4 TIMES A DAY AS NEEDED FOR SIGNS/ SYMPTOMS OF HYPOGLYCEMIA SHAKINESS, SWEATING, BLURRED VISION. Dx E11.9 What changed:  how much to take how to take this when to take this Changed by: Worthy Rancher, MD   furosemide 20 MG tablet Commonly known as: LASIX TAKE 1  TABLET BY MOUTH ONCE DAILY AS NEEDED FOR WEIGHT GAIN GREATER THAN 3 LBS   Global Ease Inject Pen Needles 31G X 5 MM Misc Generic drug: Insulin Pen Needle SMARTSIG:Syringe(s) SUB-Q   TRUEplus Pen Needles 31G X 8 MM Misc Generic drug: Insulin Pen Needle Use 1 pen needle to administer insulin 4 times daily   glucose monitoring kit monitoring kit 1 each by Does not apply route as needed for other.   HumaLOG KwikPen 100 UNIT/ML KwikPen Generic drug: insulin lispro Inject 15 mLs into the skin as directed.   ibuprofen 200 MG tablet Commonly known as: Motrin IB Take 2 tablets (400 mg total) by mouth daily.   icosapent Ethyl 1 g capsule Commonly known as: Vascepa Take 2 capsules (2 g total) by mouth 2 (two) times daily. What changed: See the new instructions. Changed by: Fransisca Kaufmann , MD   lisinopril 10 MG tablet Commonly known as: ZESTRIL TAKE (1) TABLET BY MOUTH ONCE DAILY FOR PROTEINURIA   loperamide 2 MG tablet Commonly known as: Imodium A-D Take 1 tablet (2 mg total) by mouth 4 (four) times daily as needed for diarrhea or loose stools.   meclizine 12.5 MG tablet Commonly known as: ANTIVERT Take 1 tablet (12.5 mg total) by mouth 3 (three) times daily as needed for dizziness.   omeprazole 40 MG capsule Commonly known as: PRILOSEC Take 1 capsule (40 mg total) by mouth daily.   Ozempic (1 MG/DOSE) 4 MG/3ML Sopn Generic drug: Semaglutide (1 MG/DOSE) Inject 1 mg into the skin once a week.   Pain Relief Extra Strength 500 MG tablet Generic drug: acetaminophen Take by mouth.   PARoxetine 30 MG tablet Commonly known as: PAXIL Take 30 mg by mouth daily.   QUEtiapine 50 MG Tb24 24 hr tablet Commonly known as: SEROQUEL XR Take 50 mg by mouth daily.   simvastatin 40 MG tablet Commonly known as: ZOCOR One tablet daily   tamsulosin 0.4 MG Caps capsule Commonly known as: FLOMAX TAKE 1 CAPSULE BY MOUTH ONCE A DAY.   Tegaderm Film 6"x8" Misc Use to cover free style  libre sensor   tolnaftate 1 % spray Commonly known as: TINACTIN Apply topically 2 (two) times daily.   Athletes Foot Spray 1 % Aero Generic drug: Tolnaftate APPLY TOPICALLY 2 TIMES A DAY.   Tyler Aas FlexTouch 100 UNIT/ML FlexTouch Pen Generic drug: insulin degludec Inject 15 Units into the skin at bedtime.   triamcinolone ointment 0.5 % Commonly known as: KENALOG APPLY TOPICALLY TWICE DAILY.   TUSSIN CF PO Take 5 mLs by mouth 4 (four) times daily as needed.   Vitamin D (Ergocalciferol) 1.25 MG (50000 UNIT) Caps capsule Commonly known as: DRISDOL TAKE 1 CAPSULE BY MOUTH ONCE A WEEK ON SATURDAYS         Objective:   BP 128/82   Pulse 93   Temp 98.1 F (36.7 C)  Ht 5' 11" (1.803 m)   Wt 197 lb (89.4 kg)   SpO2 98%   BMI 27.48 kg/m   Wt Readings from Last 3 Encounters:  09/05/21 197 lb (89.4 kg)  08/26/21 192 lb (87.1 kg)  05/20/21 180 lb 1.6 oz (81.7 kg)    Physical Exam Vitals and nursing note reviewed.  Constitutional:      General: He is not in acute distress.    Appearance: He is well-developed. He is not diaphoretic.  Eyes:     General: No scleral icterus.    Conjunctiva/sclera: Conjunctivae normal.  Neck:     Thyroid: No thyromegaly.  Cardiovascular:     Rate and Rhythm: Normal rate and regular rhythm.     Heart sounds: Normal heart sounds. No murmur heard. Pulmonary:     Effort: Pulmonary effort is normal. No respiratory distress.     Breath sounds: Normal breath sounds. No wheezing.  Musculoskeletal:        General: No swelling. Normal range of motion.     Cervical back: Neck supple.  Lymphadenopathy:     Cervical: No cervical adenopathy.  Skin:    General: Skin is warm and dry.     Findings: No rash.  Neurological:     Mental Status: He is alert and oriented to person, place, and time.     Coordination: Coordination normal.  Psychiatric:        Behavior: Behavior normal.      Assessment & Plan:   Problem List Items Addressed This  Visit       Cardiovascular and Mediastinum   Hypertension associated with diabetes ([place]) - Primary   Relevant Medications   icosapent Ethyl (VASCEPA) 1 g capsule   HUMALOG KWIKPEN 100 UNIT/ML KwikPen     Digestive   Gastroesophageal reflux disease   Relevant Medications   omeprazole (PRILOSEC) 40 MG capsule     Endocrine   Controlled type 2 diabetes mellitus with stage 3 chronic kidney disease, with long-term current use of insulin (HCC)   Relevant Medications   glucose blood (FORA V30A BLOOD GLUCOSE TEST) test strip   HUMALOG KWIKPEN 100 UNIT/ML KwikPen   CKD stage 3 due to type 2 diabetes mellitus (HCC)   Relevant Medications   HUMALOG KWIKPEN 100 UNIT/ML KwikPen   Hyperlipidemia associated with type 2 diabetes mellitus (HCC)   Relevant Medications   icosapent Ethyl (VASCEPA) 1 g capsule   HUMALOG KWIKPEN 100 UNIT/ML KwikPen   Other Visit Diagnoses     Weight loss, unintentional       Relevant Medications   omeprazole (PRILOSEC) 40 MG capsule     Patient sees endocrinology and nephrology to help manage his kidneys and his diabetes.  Patient started smoking again and has no desire to quit Follow up plan: Return in about 6 months (around 03/08/2022), or if symptoms worsen or fail to improve, for DM and hld and htn.  Counseling provided for all of the vaccine components No orders of the defined types were placed in this encounter.   Caryl Pina, MD [facility] 09/05/2021, 1:48 PM

## 2021-09-13 ENCOUNTER — Ambulatory Visit: Payer: Medicare Other | Admitting: Family Medicine

## 2021-10-07 ENCOUNTER — Ambulatory Visit (INDEPENDENT_AMBULATORY_CARE_PROVIDER_SITE_OTHER): Payer: Medicare Other | Admitting: Family Medicine

## 2021-10-07 ENCOUNTER — Other Ambulatory Visit: Payer: Self-pay | Admitting: Family Medicine

## 2021-10-07 ENCOUNTER — Encounter: Payer: Self-pay | Admitting: Family Medicine

## 2021-10-07 VITALS — BP 107/65 | HR 75 | Temp 98.0°F | Ht 71.0 in | Wt 196.0 lb

## 2021-10-07 DIAGNOSIS — R413 Other amnesia: Secondary | ICD-10-CM | POA: Diagnosis not present

## 2021-10-08 ENCOUNTER — Encounter: Payer: Self-pay | Admitting: Physician Assistant

## 2021-10-30 ENCOUNTER — Other Ambulatory Visit: Payer: Self-pay | Admitting: Family Medicine

## 2021-10-30 DIAGNOSIS — E1122 Type 2 diabetes mellitus with diabetic chronic kidney disease: Secondary | ICD-10-CM

## 2021-11-04 ENCOUNTER — Other Ambulatory Visit: Payer: Self-pay | Admitting: Family Medicine

## 2021-11-04 DIAGNOSIS — E1159 Type 2 diabetes mellitus with other circulatory complications: Secondary | ICD-10-CM

## 2021-11-04 DIAGNOSIS — E1122 Type 2 diabetes mellitus with diabetic chronic kidney disease: Secondary | ICD-10-CM

## 2021-11-11 NOTE — Progress Notes (Unsigned)
Assessment/Plan:    1.  Tremor  -Patient's exam remains consistent with prior examinations.  Has both rest and intention tremor. Really am a little bit leery about giving him more medication.  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  The most disabling tremor likely comes from the Depakote, although he may have some essential tremor as well.  The rest tremor likely comes from the quetiapine.  Discussed risks of polypharmacy and all agree that we should hold off on more medication.  -Given concerns of declining mentally, I think it would not be a bad idea to go ahead and proceed with DaTscan.  I am going to do that if his CT brain is unremarkable.  Otherwise, his exam actually looks fairly stable.  2.  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  He has dentures but does not wear them because they are ill fitting.  His dyskinesia has really been stable for the last visits.  3.  Memory change  -this is very difficult to evaluation in patients with baseline cognitive dysfunction, especially without a baseline comparison  -he is on several medications that may be contributing (VPA, quetiapine - although on low dose, potentially flomax), but those medications have not been changed and caregivers are noticing a decline.  -pts kidney function has been deteriorating.  I do not see it has been rechecked since April but he does see [facility].  Caregivers confirmed that has not been done since then.  This can affect cog function.  We will recheck that.  We will also do B12, ammonia, folate, RPR  -CT brain will be performed  -consider DaT scan, as above    Subjective:   David Zamora was seen today in follow up for tremor.  My previous records were reviewed prior to todays visit.  Pt  with SS guardian, Melissa, who supplements hx.  Patient last seen in novemeber, 2022.  We discussed drug induced tremor and all of [place] agreed we don't want to increase his medication, primarily b/c it wasn't that bothersome to the patient.  He presents today to discuss memory change.  They note that pt will forget his birthday (was able to name it today) but then couldn't state his age.  He is a bit more repetitive.  He used to smoke and then became adament that he was a smoker and wanted to smoke.  He started to smoke again and so he is now smoking 3 cigarettes per day.  He wanted a worker to take him to get a job application and they weren't able to convince him that people his age generally don't start working.  No hallucinations, either visual or auditory.  He is napping a lot in the day, perhaps 3-4 hours, in addition to the nighttime sleep.        ALLERGIES:   Allergies  Allergen Reactions   Soap     White soaps    CURRENT MEDICATIONS:  Outpatient Encounter Medications as of 11/12/2021  Medication Sig   ACCU-CHEK GUIDE test strip USE TO TEST BLOOD SUGAR 3 TIMES A DAY AS NEEDED FOR SIGNS/ SYMPTOMS OF HYPOGLYCEMIA SHAKINESS, SWEATING, BLURRED VISION.   ATHLETES FOOT SPRAY 1 % AERO APPLY TOPICALLY 2 TIMES A DAY.   blood glucose meter kit and supplies KIT Dispense based on patient and insurance  preference. Use up to four times daily as directed. (FOR ICD-9 250.00, 250.01).   blood glucose meter kit and supplies Dispense based on patient and insurance preference. Use up to four times daily as directed. (FOR ICD-10 E10.9, E11.9).   buPROPion (WELLBUTRIN XL) 150 MG 24 hr tablet Take 150 mg by mouth every morning.   cycloSPORINE (RESTASIS) 0.05 % ophthalmic emulsion 1 drop 2 (two) times daily.   divalproex (DEPAKOTE ER) 250 MG 24 hr tablet Take 250-1,000 mg by mouth See admin instructions. 266m in the morning and 10068m at bedtime   Emollient (CERAVE) CREA APPLY 1 APPLICATION ONCE A DAY TO BOTH  FEET   fluticasone (FLONASE) 50 MCG/ACT nasal spray 1 SPRAY EACH NOSTRIL TWICE DAILY AS NEEDED FOR ALLERGIES OR RHINITIS.   FORA Lancets MISC USE TO TEST BLOOD SUGAR 3 TIMES A DAY USE TO TEST BLOOD SUGAR AS NEEDED FOR SIGNS/SYMPTOMS OF HYPOGLYCEMIA (SHAKINESS, SWEATING, BLURRED VIS   furosemide (LASIX) 20 MG tablet TAKE 1 TABLET BY MOUTH ONCE DAILY AS NEEDED FOR WEIGHT GAIN GREATER THAN 3 LBS   GLOBAL EASE INJECT PEN NEEDLES 31G X 5 MM MISC SMARTSIG:Syringe(s) SUB-Q   glucose monitoring kit (FREESTYLE) monitoring kit 1 each by Does not apply route as needed for other.   HUMALOG KWIKPEN 100 UNIT/ML KwikPen Inject 15 mLs into the skin as directed.   ibuprofen (MOTRIN IB) 200 MG tablet Take 2 tablets (400 mg total) by mouth daily.   icosapent Ethyl (VASCEPA) 1 g capsule Take 2 capsules (2 g total) by mouth 2 (two) times daily.   Insulin Pen Needle (TRUEPLUS PEN NEEDLES) 31G X 8 MM MISC Use 1 pen needle to administer insulin 4 times daily   lisinopril (ZESTRIL) 10 MG tablet TAKE (1) TABLET BY MOUTH ONCE DAILY FOR PROTEINURIA   loperamide (IMODIUM A-D) 2 MG tablet Take 1 tablet (2 mg total) by mouth 4 (four) times daily as needed for diarrhea or loose stools.   meclizine (ANTIVERT) 12.5 MG tablet Take 1 tablet (12.5 mg total) by mouth 3 (three) times daily as needed for dizziness.   omeprazole (PRILOSEC) 40 MG capsule Take 1 capsule (40 mg total) by mouth daily.   PAIN RELIEF EXTRA STRENGTH 500 MG tablet Take by mouth.   PARoxetine (PAXIL) 30 MG tablet Take 30 mg by mouth daily.   Phenylephrine-DM-GG (TUSSIN CF PO) Take 5 mLs by mouth 4 (four) times daily as needed.   QUEtiapine (SEROQUEL XR) 50 MG TB24 24 hr tablet Take 50 mg by mouth daily.   Semaglutide, 1 MG/DOSE, (OZEMPIC, 1 MG/DOSE,) 4 MG/3ML SOPN Inject 1 mg into the skin once a week.   simvastatin (ZOCOR) 40 MG tablet One tablet daily   sodium zirconium cyclosilicate (LOKELMA) 5 g packet Take 5 g by mouth.   tamsulosin (FLOMAX) 0.4 MG CAPS  capsule TAKE 1 CAPSULE BY MOUTH ONCE A DAY.   tolnaftate (TINACTIN) 1 % spray Apply topically 2 (two) times daily.   Transparent Dressings (TEGADERM FILM 6"X8") MISC Use to cover free style libre sensor   TRESIBA FLEXTOUCH 100 UNIT/ML FlexTouch Pen Inject 15 Units into the skin at bedtime.   triamcinolone ointment (KENALOG) 0.5 % APPLY TOPICALLY TWICE DAILY.   Vitamin D, Ergocalciferol, (DRISDOL) 1.25 MG (50000 UNIT) CAPS capsule TAKE 1 CAPSULE BY MOUTH ONCE A WEEK ON SATURDAYS   No facility-administered encounter medications on file as of 11/12/2021.     Objective:    PHYSICAL EXAMINATION:    VITALS:   Vitals:  11/12/21 1334  BP: (!) 140/60  Pulse: 95  SpO2: 94%  Weight: 193 lb 9.6 oz (87.8 kg)  Height: '5\' 8"'  (1.727 m)     GEN:  The patient appears stated age and is in NAD. HEENT:  Normocephalic, atraumatic.  The mucous membranes are moist. The superficial temporal arteries are without ropiness or tenderness.  There is oral buccal lingual dyskinesia. CV:  RRR Lungs:  CTAB Neck/HEME:  There are no carotid bruits bilaterally.  Neurological examination:  Orientation: The patient is alert and oriented x3.  He looks to caregivers for finer aspects of the history. Cranial nerves: There is good facial symmetry. The speech is fluent and clear. Soft palate rises symmetrically and there is no tongue deviation. Hearing is intact to conversational tone. Sensation: Sensation is intact to light touch throughout Motor: Strength is at least antigravity x4.  Movement examination: Tone: There is normal tone in the bilateral upper extremities.  The tone in the lower extremities is normal.  Abnormal movements: there is mild left upper extremity rest tremor.  There is L hand tremor with ambulation.  This is stable from previous visits.  There is postural tremor bilaterally, L>R.   Coordination:  There is no decremation with RAM's, with any form of RAMS, including alternating supination and  pronation of the forearm, hand opening and closing, finger taps, heel taps and toe taps. Gait and Station: The patient has no difficulty arising out of a deep-seated chair without the use of the hands. The patient's stride length is good.   I have reviewed and interpreted the following labs independently   Chemistry      Component Value Date/Time   NA 140 07/18/2021 1255   NA 140 04/19/2021 1108   NA 133 (L) 02/20/2013 1229   K 4.8 07/18/2021 1255   K 4.1 02/20/2013 1229   CL 108 07/18/2021 1255   CL 100 02/20/2013 1229   CO2 26 07/18/2021 1255   CO2 28 02/20/2013 1229   BUN 46 (H) 07/18/2021 1255   BUN 27 04/19/2021 1108   BUN 27 (H) 02/20/2013 1229   CREATININE 2.88 (H) 07/18/2021 1255   CREATININE 1.60 (H) 02/20/2013 1229      Component Value Date/Time   CALCIUM 8.6 (L) 07/18/2021 1255   CALCIUM 8.6 07/18/2021 1254   ALKPHOS 56 05/20/2021 2054   ALKPHOS 121 02/20/2013 1229   AST 21 05/20/2021 2054   AST 31 02/20/2013 1229   ALT 15 05/20/2021 2054   ALT 26 02/20/2013 1229   BILITOT 0.4 05/20/2021 2054   BILITOT 0.2 04/19/2021 1108   BILITOT 0.3 02/20/2013 1229      Lab Results  Component Value Date   WBC 6.3 07/18/2021   HGB 11.9 (L) 07/18/2021   HCT 35.0 (L) 07/18/2021   MCV 99.4 07/18/2021   PLT 217 07/18/2021   Lab Results  Component Value Date   TSH 4.933 (H) 07/18/2021     Chemistry      Component Value Date/Time   NA 140 07/18/2021 1255   NA 140 04/19/2021 1108   NA 133 (L) 02/20/2013 1229   K 4.8 07/18/2021 1255   K 4.1 02/20/2013 1229   CL 108 07/18/2021 1255   CL 100 02/20/2013 1229   CO2 26 07/18/2021 1255   CO2 28 02/20/2013 1229   BUN 46 (H) 07/18/2021 1255   BUN 27 04/19/2021 1108   BUN 27 (H) 02/20/2013 1229   CREATININE 2.88 (H) 07/18/2021 1255   CREATININE  1.60 (H) 02/20/2013 1229      Component Value Date/Time   CALCIUM 8.6 (L) 07/18/2021 1255   CALCIUM 8.6 07/18/2021 1254   ALKPHOS 56 05/20/2021 2054   ALKPHOS 121 02/20/2013  1229   AST 21 05/20/2021 2054   AST 31 02/20/2013 1229   ALT 15 05/20/2021 2054   ALT 26 02/20/2013 1229   BILITOT 0.4 05/20/2021 2054   BILITOT 0.2 04/19/2021 1108   BILITOT 0.3 02/20/2013 1229      No results found for: "VITAMINB12"    Total time spent on today's visit was 31 minutes, including both face-to-face time and nonface-to-face time.  Time included that spent on review of records (prior notes available to me/labs/imaging if pertinent), discussing treatment and goals, answering patient's questions and coordinating care.  Cc:  Dettinger, Fransisca Kaufmann, MD

## 2021-11-12 ENCOUNTER — Encounter: Payer: Self-pay | Admitting: Neurology

## 2021-11-12 ENCOUNTER — Ambulatory Visit (INDEPENDENT_AMBULATORY_CARE_PROVIDER_SITE_OTHER): Payer: Medicare Other | Admitting: Neurology

## 2021-11-12 ENCOUNTER — Other Ambulatory Visit (INDEPENDENT_AMBULATORY_CARE_PROVIDER_SITE_OTHER): Payer: Medicare Other

## 2021-11-12 VITALS — BP 140/60 | HR 95 | Ht 68.0 in | Wt 193.6 lb

## 2021-11-12 DIAGNOSIS — N289 Disorder of kidney and ureter, unspecified: Secondary | ICD-10-CM

## 2021-11-12 DIAGNOSIS — Z79899 Other long term (current) drug therapy: Secondary | ICD-10-CM | POA: Diagnosis not present

## 2021-11-12 DIAGNOSIS — R4182 Altered mental status, unspecified: Secondary | ICD-10-CM | POA: Diagnosis not present

## 2021-11-12 LAB — AMMONIA: Ammonia: 46 umol/L — ABNORMAL HIGH (ref 11–35)

## 2021-11-12 LAB — COMPREHENSIVE METABOLIC PANEL
ALT: 9 U/L (ref 0–53)
AST: 15 U/L (ref 0–37)
Albumin: 4.2 g/dL (ref 3.5–5.2)
Alkaline Phosphatase: 55 U/L (ref 39–117)
BUN: 31 mg/dL — ABNORMAL HIGH (ref 6–23)
CO2: 27 mEq/L (ref 19–32)
Calcium: 9.1 mg/dL (ref 8.4–10.5)
Chloride: 105 mEq/L (ref 96–112)
Creatinine, Ser: 2.6 mg/dL — ABNORMAL HIGH (ref 0.40–1.50)
GFR: 25.5 mL/min — ABNORMAL LOW (ref >60.00)
Glucose, Bld: 140 mg/dL — ABNORMAL HIGH (ref 70–99)
Potassium: 5 mEq/L (ref 3.5–5.1)
Sodium: 141 mEq/L (ref 135–145)
Total Bilirubin: 0.3 mg/dL (ref 0.2–1.2)
Total Protein: 6.5 g/dL (ref 6.0–8.3)

## 2021-11-12 LAB — VITAMIN B12: Vitamin B-12: 325 pg/mL (ref 211–911)

## 2021-11-12 LAB — FOLATE: Folate: 15 ng/mL (ref >5.9)

## 2021-11-13 ENCOUNTER — Telehealth: Payer: Self-pay

## 2021-11-13 LAB — RPR: RPR Ser Ql: NONREACTIVE

## 2021-11-13 NOTE — Telephone Encounter (Signed)
-----   Message from [place], DO sent at 11/12/2021  5:36 PM EDT ----- Let pt guardian know that kidney function is stable/improved.   B12 is a tad low.  Recommend start b12 supplement 1068mcg daily

## 2021-11-13 NOTE — Telephone Encounter (Signed)
Called pt guardian Melissa left a voice mail that kidney function is stable/improved.   B12 is a tad low.  Recommend start b12 supplement 1044mcg daily if any questions call us back at [phone]

## 2021-11-28 ENCOUNTER — Ambulatory Visit
Admission: RE | Admit: 2021-11-28 | Discharge: 2021-11-28 | Disposition: A | Discharge status: Home / Self Care | Payer: Medicare Other | Source: Ambulatory Visit | Attending: Neurology | Admitting: Neurology

## 2021-11-28 DIAGNOSIS — R4182 Altered mental status, unspecified: Secondary | ICD-10-CM

## 2021-11-28 DIAGNOSIS — N289 Disorder of kidney and ureter, unspecified: Secondary | ICD-10-CM

## 2021-11-28 DIAGNOSIS — Z79899 Other long term (current) drug therapy: Secondary | ICD-10-CM

## 2021-12-02 ENCOUNTER — Other Ambulatory Visit: Payer: Self-pay

## 2021-12-02 ENCOUNTER — Telehealth: Payer: Self-pay

## 2021-12-02 DIAGNOSIS — E1122 Type 2 diabetes mellitus with diabetic chronic kidney disease: Secondary | ICD-10-CM

## 2021-12-02 MED ORDER — CYANOCOBALAMIN 2000 MCG PO TABS: 1000.0000 ug | ORAL_TABLET | Freq: Every day | ORAL | 2 refills | Status: DC

## 2021-12-02 NOTE — Telephone Encounter (Signed)
Order for B12 was sent to [facility]

## 2021-12-02 NOTE — Telephone Encounter (Signed)
Called and left message to call back for there fax number

## 2021-12-02 NOTE — Telephone Encounter (Signed)
OK to send order for vitamin B12 104mcg daily, as requested.

## 2021-12-02 NOTE — Telephone Encounter (Signed)
done

## 2021-12-04 ENCOUNTER — Other Ambulatory Visit (HOSPITAL_COMMUNITY): Payer: Self-pay | Admitting: Nephrology

## 2021-12-04 ENCOUNTER — Other Ambulatory Visit: Payer: Self-pay | Admitting: Nephrology

## 2021-12-04 DIAGNOSIS — E1122 Type 2 diabetes mellitus with diabetic chronic kidney disease: Secondary | ICD-10-CM | POA: Diagnosis not present

## 2021-12-04 DIAGNOSIS — N189 Chronic kidney disease, unspecified: Secondary | ICD-10-CM | POA: Diagnosis not present

## 2021-12-04 DIAGNOSIS — N281 Cyst of kidney, acquired: Secondary | ICD-10-CM

## 2021-12-04 DIAGNOSIS — D631 Anemia in chronic kidney disease: Secondary | ICD-10-CM | POA: Diagnosis not present

## 2021-12-04 DIAGNOSIS — E1129 Type 2 diabetes mellitus with other diabetic kidney complication: Secondary | ICD-10-CM

## 2021-12-04 DIAGNOSIS — E875 Hyperkalemia: Secondary | ICD-10-CM | POA: Diagnosis not present

## 2021-12-04 DIAGNOSIS — R809 Proteinuria, unspecified: Secondary | ICD-10-CM | POA: Diagnosis not present

## 2021-12-06 ENCOUNTER — Other Ambulatory Visit: Payer: Self-pay | Admitting: Nurse Practitioner

## 2021-12-10 ENCOUNTER — Ambulatory Visit (HOSPITAL_COMMUNITY)
Admission: RE | Admit: 2021-12-10 | Discharge: 2021-12-10 | Disposition: A | Discharge status: Home / Self Care | Payer: Medicare Other | Source: Ambulatory Visit | Attending: Nephrology | Admitting: Nephrology

## 2021-12-10 DIAGNOSIS — E1129 Type 2 diabetes mellitus with other diabetic kidney complication: Secondary | ICD-10-CM | POA: Insufficient documentation

## 2021-12-10 DIAGNOSIS — R809 Proteinuria, unspecified: Secondary | ICD-10-CM | POA: Insufficient documentation

## 2021-12-10 DIAGNOSIS — N189 Chronic kidney disease, unspecified: Secondary | ICD-10-CM | POA: Diagnosis not present

## 2021-12-10 DIAGNOSIS — N3289 Other specified disorders of bladder: Secondary | ICD-10-CM | POA: Diagnosis not present

## 2021-12-10 DIAGNOSIS — N182 Chronic kidney disease, stage 2 (mild): Secondary | ICD-10-CM | POA: Insufficient documentation

## 2021-12-10 DIAGNOSIS — E1122 Type 2 diabetes mellitus with diabetic chronic kidney disease: Secondary | ICD-10-CM | POA: Insufficient documentation

## 2021-12-10 DIAGNOSIS — N281 Cyst of kidney, acquired: Secondary | ICD-10-CM | POA: Insufficient documentation

## 2021-12-27 NOTE — Progress Notes (Signed)
[facility] Quality Team Note  Name: NESHAWN AIRD Date of Birth: 1960/11/05 MRN: [id number] Date: 12/27/2021  [facility] Quality Team has reviewed this patient's chart, please see recommendations below:  [facility] Quality Other; (KED: Kidney Health Evaluation Gap- Patient needs Urine Albumin Creatinine Ratio Test completed for gap closure. EGFR has already been completed, Patient has upcoming appointment with [facility] 03/12/2022.)

## 2021-12-31 ENCOUNTER — Encounter: Payer: Self-pay | Admitting: Nurse Practitioner

## 2021-12-31 ENCOUNTER — Ambulatory Visit (INDEPENDENT_AMBULATORY_CARE_PROVIDER_SITE_OTHER): Payer: Medicare Other | Admitting: Nurse Practitioner

## 2021-12-31 VITALS — BP 119/74 | HR 96 | Ht 68.0 in | Wt 193.2 lb

## 2021-12-31 DIAGNOSIS — N184 Chronic kidney disease, stage 4 (severe): Secondary | ICD-10-CM

## 2021-12-31 DIAGNOSIS — E1159 Type 2 diabetes mellitus with other circulatory complications: Secondary | ICD-10-CM

## 2021-12-31 DIAGNOSIS — Z794 Long term (current) use of insulin: Secondary | ICD-10-CM | POA: Diagnosis not present

## 2021-12-31 DIAGNOSIS — I152 Hypertension secondary to endocrine disorders: Secondary | ICD-10-CM

## 2021-12-31 DIAGNOSIS — E782 Mixed hyperlipidemia: Secondary | ICD-10-CM | POA: Diagnosis not present

## 2021-12-31 DIAGNOSIS — E1122 Type 2 diabetes mellitus with diabetic chronic kidney disease: Secondary | ICD-10-CM

## 2021-12-31 LAB — POCT GLYCOSYLATED HEMOGLOBIN (HGB A1C): Hemoglobin A1C: 6.2 % — AB (ref 4.0–5.6)
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
## 2022-01-24 ENCOUNTER — Other Ambulatory Visit: Payer: Self-pay | Admitting: Family Medicine

## 2022-01-24 MED ORDER — PAIN RELIEF EXTRA STRENGTH 500 MG PO TABS: 500.0000 mg | ORAL_TABLET | Freq: Four times a day (QID) | ORAL | 1 refills | Status: AC | PRN

## 2022-01-24 NOTE — Progress Notes (Signed)
Sent Tylenol prescription for the patient

## 2022-02-03 DIAGNOSIS — N281 Cyst of kidney, acquired: Secondary | ICD-10-CM | POA: Diagnosis not present

## 2022-02-03 DIAGNOSIS — E1129 Type 2 diabetes mellitus with other diabetic kidney complication: Secondary | ICD-10-CM | POA: Diagnosis not present

## 2022-02-03 DIAGNOSIS — R809 Proteinuria, unspecified: Secondary | ICD-10-CM | POA: Diagnosis not present

## 2022-02-03 DIAGNOSIS — N189 Chronic kidney disease, unspecified: Secondary | ICD-10-CM | POA: Diagnosis not present

## 2022-02-03 DIAGNOSIS — E1122 Type 2 diabetes mellitus with diabetic chronic kidney disease: Secondary | ICD-10-CM | POA: Diagnosis not present

## 2022-02-05 DIAGNOSIS — E1129 Type 2 diabetes mellitus with other diabetic kidney complication: Secondary | ICD-10-CM | POA: Diagnosis not present

## 2022-02-05 DIAGNOSIS — N189 Chronic kidney disease, unspecified: Secondary | ICD-10-CM | POA: Diagnosis not present

## 2022-02-05 DIAGNOSIS — N281 Cyst of kidney, acquired: Secondary | ICD-10-CM | POA: Diagnosis not present

## 2022-02-05 DIAGNOSIS — R809 Proteinuria, unspecified: Secondary | ICD-10-CM | POA: Diagnosis not present

## 2022-02-05 DIAGNOSIS — E1122 Type 2 diabetes mellitus with diabetic chronic kidney disease: Secondary | ICD-10-CM | POA: Diagnosis not present

## 2022-02-05 DIAGNOSIS — I129 Hypertensive chronic kidney disease with stage 1 through stage 4 chronic kidney disease, or unspecified chronic kidney disease: Secondary | ICD-10-CM | POA: Diagnosis not present

## 2022-02-05 DIAGNOSIS — D631 Anemia in chronic kidney disease: Secondary | ICD-10-CM | POA: Diagnosis not present

## 2022-02-11 DIAGNOSIS — M79676 Pain in unspecified toe(s): Secondary | ICD-10-CM | POA: Diagnosis not present

## 2022-02-11 DIAGNOSIS — E1142 Type 2 diabetes mellitus with diabetic polyneuropathy: Secondary | ICD-10-CM | POA: Diagnosis not present

## 2022-02-11 DIAGNOSIS — L84 Corns and callosities: Secondary | ICD-10-CM | POA: Diagnosis not present

## 2022-02-11 DIAGNOSIS — B351 Tinea unguium: Secondary | ICD-10-CM | POA: Diagnosis not present

## 2022-03-12 ENCOUNTER — Encounter: Payer: Self-pay | Admitting: Family Medicine

## 2022-03-12 ENCOUNTER — Ambulatory Visit (INDEPENDENT_AMBULATORY_CARE_PROVIDER_SITE_OTHER): Payer: Medicare Other | Admitting: Family Medicine

## 2022-03-12 VITALS — BP 133/74 | HR 98 | Temp 97.4°F | Ht 68.0 in | Wt 188.0 lb

## 2022-03-12 DIAGNOSIS — K219 Gastro-esophageal reflux disease without esophagitis: Secondary | ICD-10-CM | POA: Diagnosis not present

## 2022-03-12 DIAGNOSIS — E1122 Type 2 diabetes mellitus with diabetic chronic kidney disease: Secondary | ICD-10-CM | POA: Diagnosis not present

## 2022-03-12 DIAGNOSIS — E1165 Type 2 diabetes mellitus with hyperglycemia: Secondary | ICD-10-CM

## 2022-03-12 DIAGNOSIS — N183 Chronic kidney disease, stage 3 unspecified: Secondary | ICD-10-CM | POA: Diagnosis not present

## 2022-03-12 DIAGNOSIS — Z794 Long term (current) use of insulin: Secondary | ICD-10-CM

## 2022-03-12 DIAGNOSIS — I152 Hypertension secondary to endocrine disorders: Secondary | ICD-10-CM | POA: Diagnosis not present

## 2022-03-12 DIAGNOSIS — E1169 Type 2 diabetes mellitus with other specified complication: Secondary | ICD-10-CM | POA: Diagnosis not present

## 2022-03-12 DIAGNOSIS — E1159 Type 2 diabetes mellitus with other circulatory complications: Secondary | ICD-10-CM | POA: Diagnosis not present

## 2022-03-12 DIAGNOSIS — E785 Hyperlipidemia, unspecified: Secondary | ICD-10-CM

## 2022-03-12 MED ORDER — TAMSULOSIN HCL 0.4 MG PO CAPS: 0.4000 mg | ORAL_CAPSULE | Freq: Every day | ORAL | 3 refills | Status: DC

## 2022-03-12 MED ORDER — SIMVASTATIN 40 MG PO TABS: ORAL_TABLET | ORAL | 30 refills | Status: DC

## 2022-03-12 MED ORDER — LISINOPRIL 10 MG PO TABS: 10.0000 mg | ORAL_TABLET | Freq: Every day | ORAL | 3 refills | Status: DC

## 2022-03-12 NOTE — Progress Notes (Signed)
BP 133/74   Pulse 98   Temp (!) 97.4 F (36.3 C)   Ht _0  (1.727 m)   Wt 188 lb (85.3 kg)   SpO2 96%   BMI 28.59 kg/m    Subjective:   Patient ID: ANTONEO GHRIST, male    DOB: 28-Apr-1960, 61 y.o.   MRN: [id number]  HPI: KALMEN LOLLAR is a 61 y.o. male presenting on 03/12/2022 for Medical Management of Chronic Issues, Hyperlipidemia, Hypertension, and Diabetes   HPI Type 2 diabetes mellitus Sees endocrinology patient comes in today for recheck of his diabetes. Patient has been currently taking Humalog and [place]. Patient is currently on an ACE inhibitor/ARB. Patient has seen an ophthalmologist this year. Patient denies any issues with their feet. The symptom started onset as an adult hypertension and hyperlipidemia GERD ARE RELATED TO DM   Hypertension Patient is currently on lisinopril, and their blood pressure today is 133/74. Patient denies any lightheadedness or dizziness. Patient denies headaches, blurred vision, chest pains, shortness of breath, or weakness. Denies any side effects from medication and is content with current medication.   Hyperlipidemia Patient is coming in for recheck of his hyperlipidemia. The patient is currently taking Vascepa and simvastatin. They deny any issues with myalgias or history of liver damage from it. They deny any focal numbness or weakness or chest pain.   GERD Patient is currently on omeprazole.  She denies any major symptoms or abdominal pain or belching or burping. She denies any blood in her stool or lightheadedness or dizziness.   Relevant past medical, surgical, family and social history reviewed and updated as indicated. Interim medical history since our last visit reviewed. Allergies and medications reviewed and updated.  Review of Systems  Constitutional:  Negative for chills and fever.  Eyes:  Negative for visual disturbance.  Respiratory:  Negative for shortness of breath and wheezing.   Cardiovascular:  Negative for chest  pain and leg swelling.  Musculoskeletal:  Negative for back pain and gait problem.  Skin:  Negative for rash.  Neurological:  Negative for dizziness, weakness and light-headedness.  All other systems reviewed and are negative.   Per HPI unless specifically indicated above   Allergies as of 03/12/2022       Reactions   Soap    White soaps        Medication List        Accurate as of March 12, 2022 10:03 AM. If you have any questions, ask your nurse or doctor.          Accu-Chek Guide test strip Generic drug: glucose blood USE TO TEST BLOOD SUGAR 3 TIMES A DAY AS NEEDED FOR SIGNS/ SYMPTOMS OF HYPOGLYCEMIA SHAKINESS, SWEATING, BLURRED VISION.   blood glucose meter kit and supplies Dispense based on patient and insurance preference. Use up to four times daily as directed. (FOR ICD-10 E10.9, E11.9).   blood glucose meter kit and supplies Kit Dispense based on patient and insurance preference. Use up to four times daily as directed. (FOR ICD-9 250.00, 250.01).   buPROPion 150 MG 24 hr tablet Commonly known as: WELLBUTRIN XL Take 150 mg by mouth every morning.   CeraVe Crea APPLY 1 APPLICATION ONCE A DAY TO BOTH FEET   cyanocobalamin 2000 MCG tablet Take 0.5 tablets (1,000 mcg total) by mouth daily.   cycloSPORINE 0.05 % ophthalmic emulsion Commonly known as: RESTASIS 1 drop 2 (two) times daily.   divalproex 250 MG 24 hr tablet Commonly known as:  DEPAKOTE ER Take 250-1,000 mg by mouth See admin instructions. 248m in the morning and 10021m at bedtime   fluticasone 50 MCG/ACT nasal spray Commonly known as: FLONASE 1 SPRAY EACH NOSTRIL TWICE DAILY AS NEEDED FOR ALLERGIES OR RHINITIS.   FORA Lancets Misc USE TO TEST BLOOD SUGAR 3 TIMES A DAY USE TO TEST BLOOD SUGAR AS NEEDED FOR SIGNS/SYMPTOMS OF HYPOGLYCEMIA (SHAKINESS, SWEATING, BLURRED VIS   furosemide 20 MG tablet Commonly known as: LASIX TAKE 1 TABLET BY MOUTH ONCE DAILY AS NEEDED FOR WEIGHT GAIN  GREATER THAN 3 LBS   Global Ease Inject Pen Needles 31G X 5 MM Misc Generic drug: Insulin Pen Needle SMARTSIG:Syringe(s) SUB-Q   TRUEplus Pen Needles 31G X 8 MM Misc Generic drug: Insulin Pen Needle Use 1 pen needle to administer insulin 4 times daily   glucose monitoring kit monitoring kit 1 each by Does not apply route as needed for other.   HumaLOG KwikPen 100 UNIT/ML KwikPen Generic drug: insulin lispro Inject 15 mLs into the skin as directed.   ibuprofen 200 MG tablet Commonly known as: Motrin IB Take 2 tablets (400 mg total) by mouth daily.   icosapent Ethyl 1 g capsule Commonly known as: Vascepa Take 2 capsules (2 g total) by mouth 2 (two) times daily.   lisinopril 10 MG tablet Commonly known as: ZESTRIL Take 1 tablet (10 mg total) by mouth daily. What changed: See the new instructions. Changed by: JoFransisca Kaufmannettinger, MD   Lokelma 5 g packet Generic drug: sodium zirconium cyclosilicate Take 5 g by mouth.   loperamide 2 MG tablet Commonly known as: Imodium A-D Take 1 tablet (2 mg total) by mouth 4 (four) times daily as needed for diarrhea or loose stools.   meclizine 12.5 MG tablet Commonly known as: ANTIVERT Take 1 tablet (12.5 mg total) by mouth 3 (three) times daily as needed for dizziness.   omeprazole 40 MG capsule Commonly known as: PRILOSEC Take 1 capsule (40 mg total) by mouth daily.   Ozempic (1 MG/DOSE) 4 MG/3ML Sopn Generic drug: Semaglutide (1 MG/DOSE) INJECT 1 MG INTO THE SKIN ONCE A WEEK.   Pain Relief Extra Strength 500 MG tablet Generic drug: acetaminophen Take 1 tablet (500 mg total) by mouth every 6 (six) hours as needed.   PARoxetine 30 MG tablet Commonly known as: PAXIL Take 30 mg by mouth daily.   QUEtiapine 50 MG Tb24 24 hr tablet Commonly known as: SEROQUEL XR Take 50 mg by mouth daily.   simvastatin 40 MG tablet Commonly known as: ZOCOR One tablet daily   tamsulosin 0.4 MG Caps capsule Commonly known as: FLOMAX Take 1  capsule (0.4 mg total) by mouth daily.   Tegaderm Film 6"x8" Misc Use to cover free style libre sensor   tolnaftate 1 % spray Commonly known as: TINACTIN Apply topically 2 (two) times daily.   Athletes Foot Spray 1 % Aero Generic drug: Tolnaftate APPLY TOPICALLY 2 TIMES A DAY.   TrTyler AaslexTouch 100 UNIT/ML FlexTouch Pen Generic drug: insulin degludec Inject 15 Units into the skin at bedtime.   triamcinolone ointment 0.5 % Commonly known as: KENALOG APPLY TOPICALLY TWICE DAILY.   TUSSIN CF PO Take 5 mLs by mouth 4 (four) times daily as needed.   Vitamin D (Ergocalciferol) 1.25 MG (50000 UNIT) Caps capsule Commonly known as: DRISDOL TAKE 1 CAPSULE BY MOUTH ONCE A WEEK ON SATURDAYS         Objective:   BP 133/74   Pulse 98  Temp (!) 97.4 F (36.3 C)   Ht _0  (1.727 m)   Wt 188 lb (85.3 kg)   SpO2 96%   BMI 28.59 kg/m   Wt Readings from Last 3 Encounters:  03/12/22 188 lb (85.3 kg)  12/31/21 193 lb 3.2 oz (87.6 kg)  11/12/21 193 lb 9.6 oz (87.8 kg)    Physical Exam Vitals and nursing note reviewed.  Constitutional:      General: He is not in acute distress.    Appearance: He is well-developed. He is not diaphoretic.  Eyes:     General: No scleral icterus.    Conjunctiva/sclera: Conjunctivae normal.  Neck:     Thyroid: No thyromegaly.  Cardiovascular:     Rate and Rhythm: Normal rate and regular rhythm.     Heart sounds: Normal heart sounds. No murmur heard. Pulmonary:     Effort: Pulmonary effort is normal. No respiratory distress.     Breath sounds: Normal breath sounds. No wheezing.  Musculoskeletal:        General: No swelling. Normal range of motion.     Cervical back: Neck supple.  Lymphadenopathy:     Cervical: No cervical adenopathy.  Skin:    General: Skin is warm and dry.     Findings: No rash.  Neurological:     Mental Status: He is alert and oriented to person, place, and time.     Coordination: Coordination normal.  Psychiatric:         Behavior: Behavior normal.       Assessment & Plan:   Problem List Items Addressed This Visit       Cardiovascular and Mediastinum   Hypertension associated with diabetes ([place]) - Primary   Relevant Medications   simvastatin (ZOCOR) 40 MG tablet   lisinopril (ZESTRIL) 10 MG tablet     Digestive   Gastroesophageal reflux disease     Endocrine   Controlled type 2 diabetes mellitus with stage 3 chronic kidney disease, with long-term current use of insulin (HCC)   Relevant Medications   simvastatin (ZOCOR) 40 MG tablet   lisinopril (ZESTRIL) 10 MG tablet   CKD stage 3 due to type 2 diabetes mellitus (HCC)   Relevant Medications   simvastatin (ZOCOR) 40 MG tablet   lisinopril (ZESTRIL) 10 MG tablet   Hyperlipidemia associated with type 2 diabetes mellitus (HCC)   Relevant Medications   simvastatin (ZOCOR) 40 MG tablet   lisinopril (ZESTRIL) 10 MG tablet   Other Visit Diagnoses     Type 2 diabetes mellitus with hyperglycemia, with long-term current use of insulin (HCC)       Relevant Medications   simvastatin (ZOCOR) 40 MG tablet   lisinopril (ZESTRIL) 10 MG tablet     Patient comes from prescription form and part of the history is provided by his caretaker.  He seems to be doing well and keeping blood sugar under control.  His last A1c 2 months ago was 6.2.  Blood pressure looks good.  No changes  Follow up plan: Return in about 6 months (around 09/10/2022), or if symptoms worsen or fail to improve, for Physical exam and diabetes and hypertension and cholesterol.  Counseling provided for all of the vaccine components No orders of the defined types were placed in this encounter.   Caryl Pina, MD [facility] 03/12/2022, 10:03 AM

## 2022-03-19 NOTE — Progress Notes (Signed)
[facility] Quality Team Note  Name: David Zamora Date of Birth: 1960/11/18 MRN: [id number] Date: 03/19/2022  [facility] Quality Team has reviewed this patient's chart, please see recommendations below:  [facility] Quality Other; (KED GAP: KIDNEY HEALTH EVALUATION- PATIENT NEEDS URINE MICROALBUMIN/CREATININE RATIO BEFORE END OF YEAR FOR GAP CLOSURE.)

## 2022-05-02 ENCOUNTER — Encounter: Payer: Self-pay | Admitting: Nurse Practitioner

## 2022-05-02 ENCOUNTER — Ambulatory Visit (INDEPENDENT_AMBULATORY_CARE_PROVIDER_SITE_OTHER): Payer: 59 | Admitting: Nurse Practitioner

## 2022-05-02 VITALS — BP 129/78 | HR 86 | Ht 68.0 in | Wt 186.2 lb

## 2022-05-02 DIAGNOSIS — E782 Mixed hyperlipidemia: Secondary | ICD-10-CM | POA: Diagnosis not present

## 2022-05-02 DIAGNOSIS — N184 Chronic kidney disease, stage 4 (severe): Secondary | ICD-10-CM | POA: Diagnosis not present

## 2022-05-02 DIAGNOSIS — E1122 Type 2 diabetes mellitus with diabetic chronic kidney disease: Secondary | ICD-10-CM | POA: Diagnosis not present

## 2022-05-02 DIAGNOSIS — E1159 Type 2 diabetes mellitus with other circulatory complications: Secondary | ICD-10-CM | POA: Diagnosis not present

## 2022-05-02 DIAGNOSIS — I152 Hypertension secondary to endocrine disorders: Secondary | ICD-10-CM

## 2022-05-02 DIAGNOSIS — Z794 Long term (current) use of insulin: Secondary | ICD-10-CM | POA: Diagnosis not present

## 2022-05-02 MED ORDER — TRESIBA FLEXTOUCH 100 UNIT/ML ~~LOC~~ SOPN: 10.0000 [IU] | PEN_INJECTOR | Freq: Every day | SUBCUTANEOUS | 3 refills | Status: DC
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
## 2022-05-08 ENCOUNTER — Telehealth: Payer: Self-pay | Admitting: Nurse Practitioner

## 2022-05-08 ENCOUNTER — Other Ambulatory Visit: Payer: Self-pay

## 2022-05-08 MED ORDER — HUMALOG KWIKPEN 100 UNIT/ML ~~LOC~~ SOPN: 3.0000 [IU] | PEN_INJECTOR | Freq: Three times a day (TID) | SUBCUTANEOUS | 1 refills | Status: DC

## 2022-05-08 NOTE — Telephone Encounter (Signed)
[place] is under an [place], [place] with [employer] called to see if you got this form. He needs this filled out and sent back on our letterhead. He states the patient had a insurance change and can not find any documentation on there end where it was oked that this was changed from from novolog to humalog  He states he needs this back by tomorrow. Please.

## 2022-05-08 NOTE — Telephone Encounter (Signed)
Noted  

## 2022-05-08 NOTE — Telephone Encounter (Signed)
I have not received a form.

## 2022-05-08 NOTE — Telephone Encounter (Signed)
Update- It was in my junk folder but the attachment is blocked. I asked Lennette Bihari at [facility] to please re fax

## 2022-05-08 NOTE — Telephone Encounter (Signed)
I have checked my email and fax que. I still do not have anything from [facility]

## 2022-05-13 DIAGNOSIS — B351 Tinea unguium: Secondary | ICD-10-CM | POA: Diagnosis not present

## 2022-05-13 DIAGNOSIS — L84 Corns and callosities: Secondary | ICD-10-CM | POA: Diagnosis not present

## 2022-05-13 DIAGNOSIS — M79676 Pain in unspecified toe(s): Secondary | ICD-10-CM | POA: Diagnosis not present

## 2022-05-13 DIAGNOSIS — E1142 Type 2 diabetes mellitus with diabetic polyneuropathy: Secondary | ICD-10-CM | POA: Diagnosis not present

## 2022-05-22 ENCOUNTER — Ambulatory Visit (INDEPENDENT_AMBULATORY_CARE_PROVIDER_SITE_OTHER): Payer: 59

## 2022-05-22 VITALS — Ht 68.0 in | Wt 182.0 lb

## 2022-05-22 DIAGNOSIS — Z122 Encounter for screening for malignant neoplasm of respiratory organs: Secondary | ICD-10-CM

## 2022-05-22 DIAGNOSIS — Z0000 Encounter for general adult medical examination without abnormal findings: Secondary | ICD-10-CM

## 2022-05-22 NOTE — Patient Instructions (Signed)
David Zamora , Thank you for taking time to come for your Medicare Wellness Visit. I appreciate your ongoing commitment to your health goals. Please review the following plan we discussed and let me know if I can assist you in the future.   These are the goals we discussed:  Goals       AWV      05/14/2020 AWV Goal: Exercise for General Health  Patient will verbalize understanding of the benefits of increased physical activity: Exercising regularly is important. It will improve your overall fitness, flexibility, and endurance. Regular exercise also will improve your overall health. It can help you control your weight, reduce stress, and improve your bone density. Over the next year, patient will increase physical activity as tolerated with a goal of at least 150 minutes of moderate physical activity per week.  You can tell that you are exercising at a moderate intensity if your heart starts beating faster and you start breathing faster but can still hold a conversation. Moderate-intensity exercise ideas include: Walking 1 mile (1.6 km) in about 15 minutes Biking Hiking Golfing Dancing Water aerobics Patient will verbalize understanding of everyday activities that increase physical activity by providing examples like the following: Yard work, such as: Sales promotion account executive Gardening Washing windows or floors Patient will be able to explain general safety guidelines for exercising:  Before you start a new exercise program, talk with your health care provider. Do not exercise so much that you hurt yourself, feel dizzy, or get very short of breath. Wear comfortable clothes and wear shoes with good support. Drink plenty of water while you exercise to prevent dehydration or heat stroke. Work out until your breathing and your heartbeat get faster.       Client will talk with LCSW about feelings of anger at being  at [name] (pt-stated)      Current Barriers:  Mental Health Concerns in patient with Chronic Diagnoses of CKD, Type 2 DM, HTN, GERD, and Depression Social Isolation  Self Harm History  Clinical Social Work Clinical Goal(s):  Over the next 30 days, client will work with LCSW to address concerns related to client's anger at being placed at [facility]  Interventions: Talked with client about his feelings of anger at being placed at [facility] Talked with client about strategies for managing anger outbursts of client Talked with client about social support network  Talked with client about recreational activities of choice (watching birds, listening to music, watching favorite TV shows)  Patient Self Care Activities:  Attends all scheduled provider appointments Attends church or other social activities  Plan:  Patient will call LCSW , as needed, to discuss client feelings of anger and managing feelings of anger  Patient will use strategies discussed to help him manage anger outbursts Patient will attend appointments as scheduled with psychiatrist. Patient will communicate needs to care provider staff at [facility] LCSW to call client in next 3 weeks to discuss client management of anger issues regarding current placement  Initial goal documentation      Manage My Emotions; Manage Depression issues      Timeframe:  Short-Term Goal Priority:  High Progress: On Track Start Date:     02/26/21                        Expected End Date:   05/27/21  Follow Up Date 04/26/21 at 2:30 PM   {Manage My Emotions (Patient)  ;Manage Depression Issues    Why is this important?   When you are stressed, down or upset, your body reacts too.  For example, your blood pressure may get higher; you may have a headache or stomachache.  When your emotions get the best of you, your body's ability to fight off cold and flu gets weak.  These steps will  help you manage your emotions.     Patient Self Care Activities:  Attends all scheduled provider appointments Performs ADL's independently  Patient Coping Strengths:  Family Friends  Patient Self Care Deficits:  Some Pain issues Mobility issues  Patient Goals:  - spend time or talk with others at least 2 to 3 times per week - practice relaxation or meditation daily - keep a calendar with appointment dates  Follow Up Plan: LCSW to call client or [place], nurse at [facility] on 04/26/21 at 2:30 PM to assess needs of client        Prevent falls      Continue Exercise       Quit Smoking      Pick a quit date and stop smoking after this date.       Weight (lb) < 200 lb (90.7 kg)      New pt goal - try to decreased amt of soft drinks         This is a list of the screening recommended for you and due dates:  Health Maintenance  Topic Date Due   Eye exam for diabetics  11/29/2019   Yearly kidney health urinalysis for diabetes  12/10/2021   COVID-19 Vaccine (4 - 2023-24 season) 12/13/2021   Hemoglobin A1C  07/01/2022   Yearly kidney function blood test for diabetes  11/13/2022   Complete foot exam   01/01/2023   Cologuard (Stool DNA test)  04/12/2023   Medicare Annual Wellness Visit  05/23/2023   DTaP/Tdap/Td vaccine (4 - Td or Tdap) 05/21/2031   Flu Shot  Completed   Hepatitis C Screening: USPSTF Recommendation to screen - Ages 18-79 yo.  Completed   HIV Screening  Completed   Zoster (Shingles) Vaccine  Completed   HPV Vaccine  Aged Out   Colon Cancer Screening  Discontinued    Advanced directives: Advance directive discussed with you today. I have provided a copy for you to complete at home and have notarized. Once this is complete please bring a copy in to our office so we can scan it into your chart.   Conditions/risks identified: Aim for 30 minutes of exercise or brisk walking, 6-8 glasses of water, and 5 servings of fruits and vegetables each day.    Next appointment: Follow up in one year for your annual wellness visit  Preventive Care 40-64 Years, Male Preventive care refers to lifestyle choices and visits with your health care provider that can promote health and wellness. What does preventive care include? A yearly physical exam. This is also called an annual well check. Dental exams once or twice a year. Routine eye exams. Ask your health care provider how often you should have your eyes checked. Personal lifestyle choices, including: Daily care of your teeth and gums. Regular physical activity. Eating a healthy diet. Avoiding tobacco and drug use. Limiting alcohol use. Practicing safe sex. Taking low-dose aspirin every day starting at age 57. What happens during an annual well check? The services and screenings done by your  health care provider during your annual well check will depend on your age, overall health, lifestyle risk factors, and family history of disease. Counseling  Your health care provider may ask you questions about your: Alcohol use. Tobacco use. Drug use. Emotional well-being. Home and relationship well-being. Sexual activity. Eating habits. Work and work Statistician. Screening  You may have the following tests or measurements: Height, weight, and BMI. Blood pressure. Lipid and cholesterol levels. These may be checked every 5 years, or more frequently if you are over 42 years old. Skin check. Lung cancer screening. You may have this screening every year starting at age 10 if you have a 30-pack-year history of smoking and currently smoke or have quit within the past 15 years. Fecal occult blood test (FOBT) of the stool. You may have this test every year starting at age 58. Flexible sigmoidoscopy or colonoscopy. You may have a sigmoidoscopy every 5 years or a colonoscopy every 10 years starting at age 22. Prostate cancer screening. Recommendations will vary depending on your family history and other  risks. Hepatitis C blood test. Hepatitis B blood test. Sexually transmitted disease (STD) testing. Diabetes screening. This is done by checking your blood sugar (glucose) after you have not eaten for a while (fasting). You may have this done every 1-3 years. Discuss your test results, treatment options, and if necessary, the need for more tests with your health care provider. Vaccines  Your health care provider may recommend certain vaccines, such as: Influenza vaccine. This is recommended every year. Tetanus, diphtheria, and acellular pertussis (Tdap, Td) vaccine. You may need a Td booster every 10 years. Zoster vaccine. You may need this after age 38. Pneumococcal 13-valent conjugate (PCV13) vaccine. You may need this if you have certain conditions and have not been vaccinated. Pneumococcal polysaccharide (PPSV23) vaccine. You may need one or two doses if you smoke cigarettes or if you have certain conditions. Talk to your health care provider about which screenings and vaccines you need and how often you need them. This information is not intended to replace advice given to you by your health care provider. Make sure you discuss any questions you have with your health care provider. Document Released: 04/27/2015 Document Revised: 12/19/2015 Document Reviewed: 01/30/2015 Elsevier Interactive Patient Education  2017 [place] Prevention in the Home Falls can cause injuries. They can happen to people of all ages. There are many things you can do to make your home safe and to help prevent falls. What can I do on the outside of my home? Regularly fix the edges of walkways and driveways and fix any cracks. Remove anything that might make you trip as you walk through a door, such as a raised step or threshold. Trim any bushes or trees on the path to your home. Use bright outdoor lighting. Clear any walking paths of anything that might make someone trip, such as rocks or  tools. Regularly check to see if handrails are loose or broken. Make sure that both sides of any steps have handrails. Any raised decks and porches should have guardrails on the edges. Have any leaves, snow, or ice cleared regularly. Use sand or salt on walking paths during winter. Clean up any spills in your garage right away. This includes oil or grease spills. What can I do in the bathroom? Use night lights. Install grab bars by the toilet and in the tub and shower. Do not use towel bars as grab bars. Use non-skid mats or decals  in the tub or shower. If you need to sit down in the shower, use a plastic, non-slip stool. Keep the floor dry. Clean up any water that spills on the floor as soon as it happens. Remove soap buildup in the tub or shower regularly. Attach bath mats securely with double-sided non-slip rug tape. Do not have throw rugs and other things on the floor that can make you trip. What can I do in the bedroom? Use night lights. Make sure that you have a light by your bed that is easy to reach. Do not use any sheets or blankets that are too big for your bed. They should not hang down onto the floor. Have a firm chair that has side arms. You can use this for support while you get dressed. Do not have throw rugs and other things on the floor that can make you trip. What can I do in the kitchen? Clean up any spills right away. Avoid walking on wet floors. Keep items that you use a lot in easy-to-reach places. If you need to reach something above you, use a strong step stool that has a grab bar. Keep electrical cords out of the way. Do not use floor polish or wax that makes floors slippery. If you must use wax, use non-skid floor wax. Do not have throw rugs and other things on the floor that can make you trip. What can I do with my stairs? Do not leave any items on the stairs. Make sure that there are handrails on both sides of the stairs and use them. Fix handrails that are  broken or loose. Make sure that handrails are as long as the stairways. Check any carpeting to make sure that it is firmly attached to the stairs. Fix any carpet that is loose or worn. Avoid having throw rugs at the top or bottom of the stairs. If you do have throw rugs, attach them to the floor with carpet tape. Make sure that you have a light switch at the top of the stairs and the bottom of the stairs. If you do not have them, ask someone to add them for you. What else can I do to help prevent falls? Wear shoes that: Do not have high heels. Have rubber bottoms. Are comfortable and fit you well. Are closed at the toe. Do not wear sandals. If you use a stepladder: Make sure that it is fully opened. Do not climb a closed stepladder. Make sure that both sides of the stepladder are locked into place. Ask someone to hold it for you, if possible. Clearly mark and make sure that you can see: Any grab bars or handrails. First and last steps. Where the edge of each step is. Use tools that help you move around (mobility aids) if they are needed. These include: Canes. Walkers. Scooters. Crutches. Turn on the lights when you go into a dark area. Replace any light bulbs as soon as they burn out. Set up your furniture so you have a clear path. Avoid moving your furniture around. If any of your floors are uneven, fix them. If there are any pets around you, be aware of where they are. Review your medicines with your doctor. Some medicines can make you feel dizzy. This can increase your chance of falling. Ask your doctor what other things that you can do to help prevent falls. This information is not intended to replace advice given to you by your health care provider. Make sure you discuss  any questions you have with your health care provider. Document Released: 01/25/2009 Document Revised: 09/06/2015 Document Reviewed: 05/05/2014 Elsevier Interactive Patient Education  2017 [employer].
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
## 2022-06-15 ENCOUNTER — Other Ambulatory Visit: Payer: Self-pay

## 2022-06-15 ENCOUNTER — Encounter (HOSPITAL_COMMUNITY): Payer: Self-pay

## 2022-06-15 ENCOUNTER — Emergency Department (HOSPITAL_COMMUNITY)
Admission: EM | Admit: 2022-06-15 | Discharge: 2022-06-15 | Payer: 59 | Attending: Emergency Medicine | Admitting: Emergency Medicine

## 2022-06-15 DIAGNOSIS — Z794 Long term (current) use of insulin: Secondary | ICD-10-CM | POA: Insufficient documentation

## 2022-06-15 DIAGNOSIS — E1122 Type 2 diabetes mellitus with diabetic chronic kidney disease: Secondary | ICD-10-CM | POA: Insufficient documentation

## 2022-06-15 DIAGNOSIS — I129 Hypertensive chronic kidney disease with stage 1 through stage 4 chronic kidney disease, or unspecified chronic kidney disease: Secondary | ICD-10-CM | POA: Diagnosis not present

## 2022-06-15 DIAGNOSIS — X788XXA Intentional self-harm by other sharp object, initial encounter: Secondary | ICD-10-CM | POA: Insufficient documentation

## 2022-06-15 DIAGNOSIS — F4322 Adjustment disorder with anxiety: Secondary | ICD-10-CM | POA: Insufficient documentation

## 2022-06-15 DIAGNOSIS — N183 Chronic kidney disease, stage 3 unspecified: Secondary | ICD-10-CM | POA: Diagnosis not present

## 2022-06-15 DIAGNOSIS — R45851 Suicidal ideations: Secondary | ICD-10-CM | POA: Insufficient documentation

## 2022-06-15 DIAGNOSIS — S51812A Laceration without foreign body of left forearm, initial encounter: Secondary | ICD-10-CM | POA: Diagnosis not present

## 2022-06-15 DIAGNOSIS — I10 Essential (primary) hypertension: Secondary | ICD-10-CM | POA: Diagnosis not present

## 2022-06-15 LAB — COMPREHENSIVE METABOLIC PANEL
ALT: 13 U/L (ref 0–44)
AST: 19 U/L (ref 15–41)
Albumin: 3.4 g/dL — ABNORMAL LOW (ref 3.5–5.0)
Alkaline Phosphatase: 51 U/L (ref 38–126)
Anion gap: 8 (ref 5–15)
BUN: 29 mg/dL — ABNORMAL HIGH (ref 8–23)
CO2: 22 mmol/L (ref 22–32)
Calcium: 8.3 mg/dL — ABNORMAL LOW (ref 8.9–10.3)
Chloride: 105 mmol/L (ref 98–111)
Creatinine, Ser: 2.34 mg/dL — ABNORMAL HIGH (ref 0.61–1.24)
GFR, Estimated: 30 mL/min — ABNORMAL LOW (ref >60)
Glucose, Bld: 144 mg/dL — ABNORMAL HIGH (ref 70–99)
Potassium: 4.7 mmol/L (ref 3.5–5.1)
Sodium: 135 mmol/L (ref 135–145)
Total Bilirubin: 0.7 mg/dL (ref 0.3–1.2)
Total Protein: 6.3 g/dL — ABNORMAL LOW (ref 6.5–8.1)

## 2022-06-15 LAB — RAPID URINE DRUG SCREEN, HOSP PERFORMED
Amphetamines: NOT DETECTED
Barbiturates: NOT DETECTED
Benzodiazepines: NOT DETECTED
Cocaine: NOT DETECTED
Opiates: NOT DETECTED
Tetrahydrocannabinol: NOT DETECTED

## 2022-06-15 LAB — ETHANOL: Alcohol, Ethyl (B): <10 mg/dL (ref <10)

## 2022-06-15 LAB — CBC
HCT: 31 % — ABNORMAL LOW (ref 39.0–52.0)
Hemoglobin: 10.7 g/dL — ABNORMAL LOW (ref 13.0–17.0)
MCH: 33.3 pg (ref 26.0–34.0)
MCHC: 34.5 g/dL (ref 30.0–36.0)
MCV: 96.6 fL (ref 80.0–100.0)
Platelets: 299 10*3/uL (ref 150–400)
RBC: 3.21 MIL/uL — ABNORMAL LOW (ref 4.22–5.81)
RDW: 12.5 % (ref 11.5–15.5)
WBC: 8.1 10*3/uL (ref 4.0–10.5)
nRBC: 0 % (ref 0.0–0.2)

## 2022-06-15 LAB — SALICYLATE LEVEL: Salicylate Lvl: <7 mg/dL — ABNORMAL LOW (ref 7.0–30.0)

## 2022-06-15 LAB — CBG MONITORING, ED
Glucose-Capillary: 134 mg/dL — ABNORMAL HIGH (ref 70–99)
Glucose-Capillary: 126 mg/dL — ABNORMAL HIGH (ref 70–99)

## 2022-06-15 LAB — ACETAMINOPHEN LEVEL: Acetaminophen (Tylenol), Serum: <10 ug/mL — ABNORMAL LOW (ref 10–30)

## 2022-06-15 MED ORDER — NICOTINE 21 MG/24HR TD PT24
21.0000 mg | MEDICATED_PATCH | Freq: Every day | TRANSDERMAL | Status: DC
  Filled 2022-06-15: qty 1

## 2022-06-15 MED ORDER — INSULIN ASPART 100 UNIT/ML IJ SOLN
0.0000 [IU] | Freq: Three times a day (TID) | INTRAMUSCULAR | Status: DC
  Administered 2022-06-15: 2 [IU] via SUBCUTANEOUS
  Filled 2022-06-15: qty 1

## 2022-06-15 MED ORDER — ACETAMINOPHEN 325 MG PO TABS
650.0000 mg | ORAL_TABLET | Freq: Once | ORAL | Status: AC
  Administered 2022-06-15: 650 mg via ORAL
  Filled 2022-06-15: qty 2

## 2022-06-15 MED ORDER — ZOLPIDEM TARTRATE 5 MG PO TABS: 5.0000 mg | ORAL_TABLET | Freq: Every evening | ORAL | Status: DC | PRN

## 2022-06-15 NOTE — ED Notes (Signed)
Pt given sandwich bag and water- pt ambulatory to bathroom
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
## 2022-06-15 NOTE — ED Notes (Signed)
Pt allowed sitter to re-wrap self infected injury to arm with guaze

## 2022-06-15 NOTE — ED Notes (Signed)
Pt has been wanded and belongings placed in a bag. Pt had pants, shirt and shoes.

## 2022-06-15 NOTE — ED Notes (Signed)
Ralph Dowdy contacted for patient transport.

## 2022-06-15 NOTE — ED Provider Notes (Signed)
[facility] Provider Note   CSN: [id number] Arrival date & time: 06/15/22  0144     History  Chief Complaint  Patient presents with   Medical Clearance    David Zamora is a 62 y.o. male.  Patient brought to the emergency department by police after he was found to have tried to cut his wrist.  Patient currently lives at a group home.  He reports that he injured his left wrist with a pen.  He is not willing to answer questions about why he did what he did.       Home Medications Prior to Admission medications   Medication Sig Start Date End Date Taking? Authorizing Provider  ACCU-CHEK GUIDE test strip USE TO TEST BLOOD SUGAR 3 TIMES A DAY AS NEEDED FOR SIGNS/ SYMPTOMS OF HYPOGLYCEMIA SHAKINESS, SWEATING, BLURRED VISION. 10/30/21   Dettinger, Fransisca Kaufmann, MD  ATHLETES FOOT SPRAY 1 % AERO APPLY TOPICALLY 2 TIMES A DAY. 05/08/20   Dettinger, Fransisca Kaufmann, MD  blood glucose meter kit and supplies KIT Dispense based on patient and insurance preference. Use up to four times daily as directed. (FOR ICD-9 250.00, 250.01). 06/18/16   Dettinger, Fransisca Kaufmann, MD  blood glucose meter kit and supplies Dispense based on patient and insurance preference. Use up to four times daily as directed. (FOR ICD-10 E10.9, E11.9). 12/10/20   Baruch Gouty, FNP  buPROPion (WELLBUTRIN XL) 150 MG 24 hr tablet Take 150 mg by mouth every morning.    [provider]  cyanocobalamin 2000 MCG tablet Take 0.5 tablets (1,000 mcg total) by mouth daily. 12/02/21   Narda Amber K, DO  cycloSPORINE (RESTASIS) 0.05 % ophthalmic emulsion 1 drop 2 (two) times daily.    [provider]  divalproex (DEPAKOTE ER) 250 MG 24 hr tablet Take 250-1,000 mg by mouth See admin instructions. '250mg'$  in the morning and '1000mg'$   at bedtime    [provider]  Emollient (CERAVE) CREA APPLY 1 APPLICATION ONCE A DAY TO BOTH FEET 08/30/20   Dettinger, Fransisca Kaufmann, MD  fluticasone (FLONASE) 50  MCG/ACT nasal spray 1 SPRAY EACH NOSTRIL TWICE DAILY AS NEEDED FOR ALLERGIES OR RHINITIS. 04/19/21   Dettinger, Fransisca Kaufmann, MD  FORA Lancets MISC USE TO TEST BLOOD SUGAR 3 TIMES A DAY USE TO TEST BLOOD SUGAR AS NEEDED FOR SIGNS/SYMPTOMS OF HYPOGLYCEMIA (SHAKINESS, SWEATING, BLURRED VIS 09/28/18   Dettinger, Fransisca Kaufmann, MD  furosemide (LASIX) 20 MG tablet TAKE 1 TABLET BY MOUTH ONCE DAILY AS NEEDED FOR WEIGHT GAIN GREATER THAN 3 LBS 03/26/20   Dettinger, Fransisca Kaufmann, MD  GLOBAL EASE INJECT PEN NEEDLES 31G X 5 MM MISC SMARTSIG:Syringe(s) SUB-Q 12/31/20   [provider]  glucose monitoring kit (FREESTYLE) monitoring kit 1 each by Does not apply route as needed for other. 02/13/21   Dettinger, Fransisca Kaufmann, MD  HUMALOG KWIKPEN 100 UNIT/ML KwikPen Inject 3-6 Units into the skin 3 (three) times daily before meals. 05/08/22   Brita Romp, NP  ibuprofen (MOTRIN IB) 200 MG tablet Take 2 tablets (400 mg total) by mouth daily. 09/22/19   Dettinger, Fransisca Kaufmann, MD  icosapent Ethyl (VASCEPA) 1 g capsule Take 2 capsules (2 g total) by mouth 2 (two) times daily. 09/05/21   Dettinger, Fransisca Kaufmann, MD  Insulin Pen Needle (TRUEPLUS PEN NEEDLES) 31G X 8 MM MISC Use 1 pen needle to administer insulin 4 times daily 02/13/21   Dettinger, Fransisca Kaufmann, MD  lisinopril (ZESTRIL)  10 MG tablet Take 1 tablet (10 mg total) by mouth daily. 03/12/22   Dettinger, Fransisca Kaufmann, MD  loperamide (IMODIUM A-D) 2 MG tablet Take 1 tablet (2 mg total) by mouth 4 (four) times daily as needed for diarrhea or loose stools. 05/07/20   Dettinger, Fransisca Kaufmann, MD  meclizine (ANTIVERT) 12.5 MG tablet Take 1 tablet (12.5 mg total) by mouth 3 (three) times daily as needed for dizziness. 08/15/20   Ivy Lynn, NP  omeprazole (PRILOSEC) 40 MG capsule Take 1 capsule (40 mg total) by mouth daily. 09/05/21   Dettinger, Fransisca Kaufmann, MD  OZEMPIC, 1 MG/DOSE, 4 MG/3ML SOPN INJECT 1 MG INTO THE SKIN ONCE A WEEK. 12/09/21   Brita Romp, NP  PAIN RELIEF EXTRA STRENGTH 500 MG  tablet Take 1 tablet (500 mg total) by mouth every 6 (six) hours as needed. 01/24/22   Dettinger, Fransisca Kaufmann, MD  PARoxetine (PAXIL) 30 MG tablet Take 30 mg by mouth daily. 01/15/21   [provider]  Phenylephrine-DM-GG (TUSSIN CF PO) Take 5 mLs by mouth 4 (four) times daily as needed.    [provider]  QUEtiapine (SEROQUEL XR) 50 MG TB24 24 hr tablet Take 50 mg by mouth daily. 11/13/20   [provider]  simvastatin (ZOCOR) 40 MG tablet One tablet daily 03/12/22   Dettinger, Fransisca Kaufmann, MD  sodium zirconium cyclosilicate (LOKELMA) 5 g packet Take 5 g by mouth.    [provider]  tamsulosin (FLOMAX) 0.4 MG CAPS capsule Take 1 capsule (0.4 mg total) by mouth daily. 03/12/22   Dettinger, Fransisca Kaufmann, MD  tolnaftate (TINACTIN) 1 % spray Apply topically 2 (two) times daily. 11/29/18   Dettinger, Fransisca Kaufmann, MD  Transparent Dressings (TEGADERM FILM [phone]") MISC Use to cover free style libre sensor 09/28/19   Dettinger, Fransisca Kaufmann, MD  TRESIBA FLEXTOUCH 100 UNIT/ML FlexTouch Pen Inject 10 Units into the skin at bedtime. 05/02/22   Brita Romp, NP  triamcinolone ointment (KENALOG) 0.5 % APPLY TOPICALLY TWICE DAILY. 08/29/19   Dettinger, Fransisca Kaufmann, MD  Vitamin D, Ergocalciferol, (DRISDOL) 1.25 MG (50000 UNIT) CAPS capsule TAKE 1 CAPSULE BY MOUTH ONCE A WEEK ON SATURDAYS 08/01/21   Dettinger, Fransisca Kaufmann, MD      Allergies    Soap    Review of Systems   Review of Systems  Physical Exam Updated Vital Signs BP 131/73 (BP Location: Left Arm)   Pulse 91   Temp 99.1 F (37.3 C) (Oral)   Resp 16   Ht '5\' 8"'$  (1.727 m)   Wt 86.2 kg   SpO2 100%   BMI 28.89 kg/m  Physical Exam Vitals and nursing note reviewed.  Constitutional:      General: He is not in acute distress.    Appearance: He is well-developed.  HENT:     Head: Normocephalic and atraumatic.     Mouth/Throat:     Mouth: Mucous membranes are moist.  Eyes:     General: Vision grossly intact. Gaze aligned  appropriately.     Extraocular Movements: Extraocular movements intact.     Conjunctiva/sclera: Conjunctivae normal.  Cardiovascular:     Rate and Rhythm: Normal rate and regular rhythm.     Pulses: Normal pulses.     Heart sounds: Normal heart sounds, S1 normal and S2 normal. No murmur heard.    No friction rub. No gallop.  Pulmonary:     Effort: Pulmonary effort is normal. No respiratory distress.  Breath sounds: Normal breath sounds.  Abdominal:     Palpations: Abdomen is soft.     Tenderness: There is no abdominal tenderness. There is no guarding or rebound.     Hernia: No hernia is present.  Musculoskeletal:        General: No swelling.     Left forearm: Laceration (Multiple superficial linear abrasions) present.     Cervical back: Full passive range of motion without pain, normal range of motion and neck supple. No pain with movement, spinous process tenderness or muscular tenderness. Normal range of motion.     Right lower leg: No edema.     Left lower leg: No edema.  Skin:    General: Skin is warm and dry.     Capillary Refill: Capillary refill takes less than 2 seconds.     Findings: No ecchymosis, erythema, lesion or wound.  Neurological:     Mental Status: He is alert and oriented to person, place, and time.     GCS: GCS eye subscore is 4. GCS verbal subscore is 5. GCS motor subscore is 6.     Cranial Nerves: Cranial nerves 2-12 are intact.     Sensory: Sensation is intact.     Motor: Motor function is intact. No weakness or abnormal muscle tone.     Coordination: Coordination is intact.  Psychiatric:        Speech: Speech normal.     Comments: Patient not answering questions.     ED Results / Procedures / Treatments   Labs (all labs ordered are listed, but only abnormal results are displayed) Labs Reviewed  CBC - Abnormal; Notable for the following components:      Result Value   RBC 3.21 (*)    Hemoglobin 10.7 (*)    HCT 31.0 (*)    All other components  within normal limits  CBG MONITORING, ED - Abnormal; Notable for the following components:   Glucose-Capillary 134 (*)    All other components within normal limits  RAPID URINE DRUG SCREEN, HOSP PERFORMED  COMPREHENSIVE METABOLIC PANEL  ETHANOL  SALICYLATE LEVEL  ACETAMINOPHEN LEVEL    EKG EKG Interpretation  Date/Time:  Sunday June 15 2022 04:02:37 EST Ventricular Rate:  79 PR Interval:  150 QRS Duration: 72 QT Interval:  374 QTC Calculation: 428 R Axis:   62 Text Interpretation: Sinus rhythm with Premature atrial complexes Otherwise normal ECG When compared with ECG of 20-May-2021 23:34, Premature atrial complexes are now Present Nonspecific T wave abnormality now evident in Lateral leads Confirmed by Orpah Greek [phone]) on 06/15/2022 4:07:11 AM  Radiology No results found.  Procedures Procedures    Medications Ordered in ED Medications - No data to display  ED Course/ Medical Decision Making/ A&P                             Medical Decision Making Amount and/or Complexity of Data Reviewed Labs: ordered.  Risk OTC drugs. Prescription drug management.   Patient presents to the emergency department in custody of police after he was found to have made multiple linear incisions on his left wrist and forearm at his group home.  He lives in the group home because of intellectual disability.  Records do indicate history of depression.  Patient not very forthcoming at this time.  Wounds are superficial.  He does not require stitches.  Wounds cleaned and dressed.  Patient will undergo psychiatric evaluation.  He is medically clear for evaluation and treatment.        Final Clinical Impression(s) / ED Diagnoses Final diagnoses:  Suicidal ideation    Rx / DC Orders ED Discharge Orders     None         Krysti Hickling, Gwenyth Allegra, MD 06/15/22 [phone]

## 2022-06-15 NOTE — ED Notes (Signed)
Pt reports his case worker was [name] through the health department, but the Pt reports he has not been in contact with the case worker for a long time. Pt reports the 'people at the group home speak to her though.'

## 2022-06-15 NOTE — Discharge Instructions (Signed)
Cleared by behavioral health for discharge home.

## 2022-06-15 NOTE — ED Notes (Signed)
Pt scratching scratches on arm, pt refusing to let us redress his arm, pt lying in bed with eyes closed and will not respond to questions or conversation

## 2022-06-15 NOTE — ED Notes (Signed)
Pt changed into [facility]oon hospital scrubs. Pt wanded by [employer]. Pt belongings/ clothing, secured in Pt locker. Pt cooperative at this time. [place] [employer] currently sitting at bedside.

## 2022-06-15 NOTE — ED Provider Notes (Signed)
Patient cleared by behavioral health for discharge home.   Fredia Sorrow, MD 06/15/22 1121

## 2022-06-25 ENCOUNTER — Other Ambulatory Visit: Payer: 59

## 2022-06-25 DIAGNOSIS — R809 Proteinuria, unspecified: Secondary | ICD-10-CM | POA: Diagnosis not present

## 2022-06-25 DIAGNOSIS — D631 Anemia in chronic kidney disease: Secondary | ICD-10-CM | POA: Diagnosis not present

## 2022-06-25 DIAGNOSIS — N281 Cyst of kidney, acquired: Secondary | ICD-10-CM | POA: Diagnosis not present

## 2022-06-25 DIAGNOSIS — E1122 Type 2 diabetes mellitus with diabetic chronic kidney disease: Secondary | ICD-10-CM | POA: Diagnosis not present

## 2022-06-25 DIAGNOSIS — N189 Chronic kidney disease, unspecified: Secondary | ICD-10-CM | POA: Diagnosis not present

## 2022-07-01 DIAGNOSIS — E1129 Type 2 diabetes mellitus with other diabetic kidney complication: Secondary | ICD-10-CM | POA: Diagnosis not present

## 2022-07-01 DIAGNOSIS — E1122 Type 2 diabetes mellitus with diabetic chronic kidney disease: Secondary | ICD-10-CM | POA: Diagnosis not present

## 2022-07-01 DIAGNOSIS — N189 Chronic kidney disease, unspecified: Secondary | ICD-10-CM | POA: Diagnosis not present

## 2022-07-01 DIAGNOSIS — E875 Hyperkalemia: Secondary | ICD-10-CM | POA: Diagnosis not present

## 2022-07-01 DIAGNOSIS — D631 Anemia in chronic kidney disease: Secondary | ICD-10-CM | POA: Diagnosis not present

## 2022-07-01 DIAGNOSIS — R809 Proteinuria, unspecified: Secondary | ICD-10-CM | POA: Diagnosis not present

## 2022-07-01 DIAGNOSIS — I129 Hypertensive chronic kidney disease with stage 1 through stage 4 chronic kidney disease, or unspecified chronic kidney disease: Secondary | ICD-10-CM | POA: Diagnosis not present

## 2022-07-21 ENCOUNTER — Other Ambulatory Visit: Payer: Self-pay | Admitting: Family Medicine

## 2022-07-21 DIAGNOSIS — E1122 Type 2 diabetes mellitus with diabetic chronic kidney disease: Secondary | ICD-10-CM

## 2022-07-21 DIAGNOSIS — E1159 Type 2 diabetes mellitus with other circulatory complications: Secondary | ICD-10-CM

## 2022-08-28 ENCOUNTER — Other Ambulatory Visit: Payer: Self-pay | Admitting: Nurse Practitioner

## 2022-08-28 DIAGNOSIS — Z794 Long term (current) use of insulin: Secondary | ICD-10-CM | POA: Diagnosis not present

## 2022-08-28 DIAGNOSIS — N184 Chronic kidney disease, stage 4 (severe): Secondary | ICD-10-CM | POA: Diagnosis not present

## 2022-08-28 DIAGNOSIS — E1122 Type 2 diabetes mellitus with diabetic chronic kidney disease: Secondary | ICD-10-CM | POA: Diagnosis not present

## 2022-08-29 LAB — COMPREHENSIVE METABOLIC PANEL
ALT: 13 IU/L (ref 0–44)
AST: 15 IU/L (ref 0–40)
Albumin/Globulin Ratio: 1.7 (ref 1.2–2.2)
Albumin: 3.8 g/dL — ABNORMAL LOW (ref 3.9–4.9)
Alkaline Phosphatase: 63 IU/L (ref 44–121)
BUN/Creatinine Ratio: 13 (ref 10–24)
BUN: 28 mg/dL — ABNORMAL HIGH (ref 8–27)
Bilirubin Total: <0.2 mg/dL (ref 0.0–1.2)
CO2: 23 mmol/L (ref 20–29)
Calcium: 9.1 mg/dL (ref 8.6–10.2)
Chloride: 103 mmol/L (ref 96–106)
Creatinine, Ser: 2.09 mg/dL — ABNORMAL HIGH (ref 0.76–1.27)
Globulin, Total: 2.2 g/dL (ref 1.5–4.5)
Glucose: 150 mg/dL — ABNORMAL HIGH (ref 70–99)
Potassium: 4.7 mmol/L (ref 3.5–5.2)
Sodium: 141 mmol/L (ref 134–144)
Total Protein: 6 g/dL (ref 6.0–8.5)
eGFR: 35 mL/min/{1.73_m2} — ABNORMAL LOW (ref >59)

## 2022-08-29 LAB — LIPID PANEL
Chol/HDL Ratio: 2.9 ratio (ref 0.0–5.0)
Cholesterol, Total: 151 mg/dL (ref 100–199)
HDL: 52 mg/dL (ref >39)
LDL Chol Calc (NIH): 56 mg/dL (ref 0–99)
Triglycerides: 279 mg/dL — ABNORMAL HIGH (ref 0–149)
VLDL Cholesterol Cal: 43 mg/dL — ABNORMAL HIGH (ref 5–40)

## 2022-08-29 LAB — T4, FREE: Free T4: 0.72 ng/dL — ABNORMAL LOW (ref 0.82–1.77)

## 2022-08-29 LAB — VITAMIN D 25 HYDROXY (VIT D DEFICIENCY, FRACTURES): Vit D, 25-Hydroxy: 42.5 ng/mL (ref 30.0–100.0)

## 2022-08-29 LAB — TSH: TSH: 3.25 u[IU]/mL (ref 0.450–4.500)

## 2022-09-01 ENCOUNTER — Ambulatory Visit (INDEPENDENT_AMBULATORY_CARE_PROVIDER_SITE_OTHER): Payer: 59 | Admitting: Nurse Practitioner

## 2022-09-01 ENCOUNTER — Encounter: Payer: Self-pay | Admitting: Nurse Practitioner

## 2022-09-01 VITALS — BP 139/87 | HR 75 | Ht 68.0 in | Wt 190.0 lb

## 2022-09-01 DIAGNOSIS — Z794 Long term (current) use of insulin: Secondary | ICD-10-CM

## 2022-09-01 DIAGNOSIS — N184 Chronic kidney disease, stage 4 (severe): Secondary | ICD-10-CM | POA: Diagnosis not present

## 2022-09-01 DIAGNOSIS — E782 Mixed hyperlipidemia: Secondary | ICD-10-CM

## 2022-09-01 DIAGNOSIS — R946 Abnormal results of thyroid function studies: Secondary | ICD-10-CM | POA: Diagnosis not present

## 2022-09-01 DIAGNOSIS — E1159 Type 2 diabetes mellitus with other circulatory complications: Secondary | ICD-10-CM | POA: Diagnosis not present

## 2022-09-01 DIAGNOSIS — I152 Hypertension secondary to endocrine disorders: Secondary | ICD-10-CM

## 2022-09-01 DIAGNOSIS — E1122 Type 2 diabetes mellitus with diabetic chronic kidney disease: Secondary | ICD-10-CM | POA: Diagnosis not present

## 2022-09-01 LAB — POCT GLYCOSYLATED HEMOGLOBIN (HGB A1C): Hemoglobin A1C: 6.1 % — AB (ref 4.0–5.6)
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
## 2022-09-11 ENCOUNTER — Ambulatory Visit (INDEPENDENT_AMBULATORY_CARE_PROVIDER_SITE_OTHER): Payer: 59 | Admitting: Family Medicine

## 2022-09-11 ENCOUNTER — Encounter: Payer: Self-pay | Admitting: Family Medicine

## 2022-09-11 VITALS — BP 124/81 | HR 86 | Ht 68.0 in | Wt 183.0 lb

## 2022-09-11 DIAGNOSIS — Z794 Long term (current) use of insulin: Secondary | ICD-10-CM

## 2022-09-11 DIAGNOSIS — I129 Hypertensive chronic kidney disease with stage 1 through stage 4 chronic kidney disease, or unspecified chronic kidney disease: Secondary | ICD-10-CM

## 2022-09-11 DIAGNOSIS — E785 Hyperlipidemia, unspecified: Secondary | ICD-10-CM

## 2022-09-11 DIAGNOSIS — E1159 Type 2 diabetes mellitus with other circulatory complications: Secondary | ICD-10-CM

## 2022-09-11 DIAGNOSIS — E1169 Type 2 diabetes mellitus with other specified complication: Secondary | ICD-10-CM | POA: Diagnosis not present

## 2022-09-11 DIAGNOSIS — K219 Gastro-esophageal reflux disease without esophagitis: Secondary | ICD-10-CM | POA: Diagnosis not present

## 2022-09-11 DIAGNOSIS — N183 Chronic kidney disease, stage 3 unspecified: Secondary | ICD-10-CM

## 2022-09-11 DIAGNOSIS — E1122 Type 2 diabetes mellitus with diabetic chronic kidney disease: Secondary | ICD-10-CM

## 2022-09-11 NOTE — Progress Notes (Signed)
BP 124/81   Pulse 86   Ht 5\' 8"  (1.727 m)   Wt 183 lb (83 kg)   SpO2 97%   BMI 27.83 kg/m    Subjective:   Patient ID: David Zamora, male    DOB: 03-02-1961, 62 y.o.   MRN: [id number]  HPI: David Zamora is a 62 y.o. male presenting on 09/11/2022 for Medical Management of Chronic Issues, Diabetes, and Hypertension   HPI GERD Patient is currently on omeprazole.  He is getting occasional belching and burping and irritation in his throat, after discussion it is found out that he does eat a lot of tomato products and getting more active and eats those things.  Type 2 diabetes mellitus, sees endocrinology Patient comes in today for recheck of his diabetes. Patient has been currently taking [place] and [place]. Patient is currently on an ACE inhibitor/ARB. Patient has not seen an ophthalmologist this year. Patient denies any new issues with their feet. The symptom started onset as an adult hypertension and CKD and hyperlipidemia ARE RELATED TO DM   Hypertension and CKD 3 Patient is currently on furosemide and lisinopril, and their blood pressure today is 124/81. Patient denies any lightheadedness or dizziness. Patient denies headaches, blurred vision, chest pains, shortness of breath, or weakness. Denies any side effects from medication and is content with current medication.   Hyperlipidemia Patient is coming in for recheck of his hyperlipidemia. The patient is currently taking simvastatin. They deny any issues with myalgias or history of liver damage from it. They deny any focal numbness or weakness or chest pain.   Relevant past medical, surgical, family and social history reviewed and updated as indicated. Interim medical history since our last visit reviewed. Allergies and medications reviewed and updated.  Review of Systems  Constitutional:  Negative for chills and fever.  HENT:  Negative for voice change.   Respiratory:  Negative for shortness of breath and  wheezing.   Cardiovascular:  Negative for chest pain and leg swelling.  Gastrointestinal:  Positive for abdominal distention. Negative for abdominal pain, diarrhea, nausea and vomiting.  Musculoskeletal:  Negative for back pain and gait problem.  Skin:  Negative for rash.  All other systems reviewed and are negative.   Per HPI unless specifically indicated above   Allergies as of 09/11/2022       Reactions   Soap    White soaps        Medication List        Accurate as of Sep 11, 2022 10:45 AM. If you have any questions, ask your nurse or doctor.          STOP taking these medications    Farxiga 5 MG Tabs tablet Generic drug: dapagliflozin propanediol Stopped by: Nils Pyle, MD   TUSSIN CF PO Stopped by: Elige Radon Merve Hotard, MD       TAKE these medications    Accu-Chek Guide test strip Generic drug: glucose blood USE TO TEST BLOOD SUGAR 3 TIMES A DAY AS NEEDED FOR SIGNS/ SYMPTOMS OF HYPOGLYCEMIA SHAKINESS, SWEATING, BLURRED VISION.   blood glucose meter kit and supplies Dispense based on patient and insurance preference. Use up to four times daily as directed. (FOR ICD-10 E10.9, E11.9).   blood glucose meter kit and supplies Kit Dispense based on patient and insurance preference. Use up to four times daily as directed. (FOR ICD-9 250.00, 250.01).   buPROPion 150 MG 24 hr tablet Commonly known as: WELLBUTRIN XL Take  150 mg by mouth every morning.   CeraVe Crea APPLY 1 APPLICATION ONCE A DAY TO BOTH FEET   cyanocobalamin 2000 MCG tablet Take 0.5 tablets (1,000 mcg total) by mouth daily.   cycloSPORINE 0.05 % ophthalmic emulsion Commonly known as: RESTASIS 1 drop 2 (two) times daily.   divalproex 250 MG 24 hr tablet Commonly known as: DEPAKOTE ER Take 250-1,000 mg by mouth See admin instructions. 250mg  in the morning and 1000mg   at bedtime   fluticasone 50 MCG/ACT nasal spray Commonly known as: FLONASE 1 SPRAY EACH NOSTRIL TWICE DAILY AS  NEEDED FOR ALLERGIES OR RHINITIS.   FORA Lancets Misc USE TO TEST BLOOD SUGAR 3 TIMES A DAY USE TO TEST BLOOD SUGAR AS NEEDED FOR SIGNS/SYMPTOMS OF HYPOGLYCEMIA (SHAKINESS, SWEATING, BLURRED VIS   furosemide 20 MG tablet Commonly known as: LASIX TAKE 1 TABLET BY MOUTH ONCE DAILY AS NEEDED FOR WEIGHT GAIN GREATER THAN 3 LBS   Global Ease Inject Pen Needles 31G X 5 MM Misc Generic drug: Insulin Pen Needle SMARTSIG:Syringe(s) SUB-Q   TRUEplus Pen Needles 31G X 8 MM Misc Generic drug: Insulin Pen Needle Use 1 pen needle to administer insulin 4 times daily   glucose monitoring kit monitoring kit 1 each by Does not apply route as needed for other.   ibuprofen 200 MG tablet Commonly known as: Motrin IB Take 2 tablets (400 mg total) by mouth daily.   icosapent Ethyl 1 g capsule Commonly known as: Vascepa Take 2 capsules (2 g total) by mouth 2 (two) times daily.   lisinopril 10 MG tablet Commonly known as: ZESTRIL Take 1 tablet (10 mg total) by mouth daily.   Lokelma 5 g packet Generic drug: sodium zirconium cyclosilicate Take 5 g by mouth.   loperamide 2 MG tablet Commonly known as: Imodium A-D Take 1 tablet (2 mg total) by mouth 4 (four) times daily as needed for diarrhea or loose stools.   meclizine 12.5 MG tablet Commonly known as: ANTIVERT Take 1 tablet (12.5 mg total) by mouth 3 (three) times daily as needed for dizziness.   omeprazole 40 MG capsule Commonly known as: PRILOSEC Take 1 capsule (40 mg total) by mouth daily.   Ozempic (1 MG/DOSE) 4 MG/3ML Sopn Generic drug: Semaglutide (1 MG/DOSE) INJECT 1 MG INTO THE SKIN ONCE A WEEK.   Pain Relief Extra Strength 500 MG tablet Generic drug: acetaminophen Take 1 tablet (500 mg total) by mouth every 6 (six) hours as needed.   PARoxetine 30 MG tablet Commonly known as: PAXIL Take 30 mg by mouth daily.   QUEtiapine 50 MG Tb24 24 hr tablet Commonly known as: SEROQUEL XR Take 50 mg by mouth daily.   simvastatin 40  MG tablet Commonly known as: ZOCOR One tablet daily   tamsulosin 0.4 MG Caps capsule Commonly known as: FLOMAX Take 1 capsule (0.4 mg total) by mouth daily.   Tegaderm Film 6"x8" Misc Use to cover free style libre sensor   tolnaftate 1 % spray Commonly known as: TINACTIN Apply topically 2 (two) times daily.   Athletes Foot Spray 1 % Aero Generic drug: Tolnaftate APPLY TOPICALLY 2 TIMES A DAY.   Evaristo Bury FlexTouch 100 UNIT/ML FlexTouch Pen Generic drug: insulin degludec Inject 10 Units into the skin at bedtime. What changed: Another medication with the same name was removed. Continue taking this medication, and follow the directions you see here. Changed by: Elige Radon Tsuyako Jolley, MD   triamcinolone ointment 0.5 % Commonly known as: KENALOG APPLY TOPICALLY TWICE DAILY.  Vitamin D (Ergocalciferol) 1.25 MG (50000 UNIT) Caps capsule Commonly known as: DRISDOL TAKE 1 CAPSULE BY MOUTH ONCE A WEEK ON SATURDAYS               Durable Medical Equipment  (From admission, onward)           Start     Ordered   09/11/22 0000  For Home Use Only DME Diabetic Shoe       Comments: Diagnosis diabetes with hammertoe and callus formation  Sized to fit diabetic shoes, patient wants diabetic boots if possible for more support   09/11/22 1045             Objective:   BP 124/81   Pulse 86   Ht 5\' 8"  (1.727 m)   Wt 183 lb (83 kg)   SpO2 97%   BMI 27.83 kg/m   Wt Readings from Last 3 Encounters:  09/11/22 183 lb (83 kg)  09/01/22 190 lb (86.2 kg)  06/15/22 190 lb (86.2 kg)    Physical Exam Vitals and nursing note reviewed.  Constitutional:      General: He is not in acute distress.    Appearance: He is well-developed. He is not diaphoretic.  Eyes:     General: No scleral icterus.    Conjunctiva/sclera: Conjunctivae normal.  Neck:     Thyroid: No thyromegaly.  Cardiovascular:     Rate and Rhythm: Normal rate and regular rhythm.     Heart sounds: Normal heart  sounds. No murmur heard. Pulmonary:     Effort: Pulmonary effort is normal. No respiratory distress.     Breath sounds: Normal breath sounds. No wheezing.  Musculoskeletal:        General: No swelling. Normal range of motion.     Cervical back: Neck supple.  Lymphadenopathy:     Cervical: No cervical adenopathy.  Skin:    General: Skin is warm and dry.     Findings: No rash.  Neurological:     Mental Status: He is alert and oriented to person, place, and time.     Coordination: Coordination normal.  Psychiatric:        Behavior: Behavior normal.    Diabetic Foot Exam - Simple   Simple Foot Form Diabetic Foot exam was performed with the following findings: Yes 09/11/2022 10:43 AM  Visual Inspection See comments: Yes Sensation Testing Intact to touch and monofilament testing bilaterally: Yes Pulse Check Posterior Tibialis and Dorsalis pulse intact bilaterally: Yes Comments Callus on the dorsum of his right fifth toe, no erythema.  On both feet second through fourth toes are hammertoe      Results for orders placed or performed in visit on 09/01/22  HgB A1c  Result Value Ref Range   Hemoglobin A1C 6.1 (A) 4.0 - 5.6 %   HbA1c POC (<> result, manual entry)     HbA1c, POC (prediabetic range)     HbA1c, POC (controlled diabetic range)      Assessment & Plan:   Problem List Items Addressed This Visit       Cardiovascular and Mediastinum   Hypertension associated with diabetes (HCC) - Primary   Relevant Medications   insulin degludec (TRESIBA FLEXTOUCH) 100 UNIT/ML FlexTouch Pen     Digestive   Gastroesophageal reflux disease     Endocrine   Controlled type 2 diabetes mellitus with stage 3 chronic kidney disease, with long-term current use of insulin (HCC)   Relevant Medications   insulin degludec (TRESIBA FLEXTOUCH)  100 UNIT/ML FlexTouch Pen   Other Relevant Orders   Microalbumin / creatinine urine ratio   For Home Use Only DME Diabetic Shoe   CKD stage 3 due to  type 2 diabetes mellitus (HCC)   Relevant Medications   insulin degludec (TRESIBA FLEXTOUCH) 100 UNIT/ML FlexTouch Pen   Hyperlipidemia associated with type 2 diabetes mellitus (HCC)   Relevant Medications   insulin degludec (TRESIBA FLEXTOUCH) 100 UNIT/ML FlexTouch Pen    A1c was 6.1 just over a week ago with endocrinology.  No changes from that regards.  Blood pressure looks good today.  Recommended that for his heartburn he avoid acidic foods especially tomatoes for the next 3 to 4 weeks and if still having issues we can adjust his medicine.  Did a foot exam and he does have callus on the top of his right fifth toe and diabetes so we will do order for shoes Follow up plan: Return in about 6 months (around 03/14/2023), or if symptoms worsen or fail to improve, for Diabetes and hypertension and CKD.  Counseling provided for all of the vaccine components Orders Placed This Encounter  Procedures   For Home Use Only DME Diabetic Shoe   Microalbumin / creatinine urine ratio    Arville Care, MD [facility] 09/11/2022, 10:45 AM

## 2022-09-12 LAB — MICROALBUMIN / CREATININE URINE RATIO
Creatinine, Urine: 275.7 mg/dL
Microalb/Creat Ratio: 1032 mg/g creat — ABNORMAL HIGH (ref 0–29)
Microalbumin, Urine: 2846 ug/mL

## 2022-09-19 DIAGNOSIS — N189 Chronic kidney disease, unspecified: Secondary | ICD-10-CM | POA: Diagnosis not present

## 2022-09-19 DIAGNOSIS — N1832 Chronic kidney disease, stage 3b: Secondary | ICD-10-CM | POA: Diagnosis not present

## 2022-09-19 DIAGNOSIS — Z79899 Other long term (current) drug therapy: Secondary | ICD-10-CM | POA: Diagnosis not present

## 2022-09-19 DIAGNOSIS — R809 Proteinuria, unspecified: Secondary | ICD-10-CM | POA: Diagnosis not present

## 2022-09-19 DIAGNOSIS — I129 Hypertensive chronic kidney disease with stage 1 through stage 4 chronic kidney disease, or unspecified chronic kidney disease: Secondary | ICD-10-CM | POA: Diagnosis not present

## 2022-09-23 DIAGNOSIS — L84 Corns and callosities: Secondary | ICD-10-CM | POA: Diagnosis not present

## 2022-09-23 DIAGNOSIS — R809 Proteinuria, unspecified: Secondary | ICD-10-CM | POA: Diagnosis not present

## 2022-09-23 DIAGNOSIS — M79676 Pain in unspecified toe(s): Secondary | ICD-10-CM | POA: Diagnosis not present

## 2022-09-23 DIAGNOSIS — E1129 Type 2 diabetes mellitus with other diabetic kidney complication: Secondary | ICD-10-CM | POA: Diagnosis not present

## 2022-09-23 DIAGNOSIS — E1142 Type 2 diabetes mellitus with diabetic polyneuropathy: Secondary | ICD-10-CM | POA: Diagnosis not present

## 2022-09-23 DIAGNOSIS — E875 Hyperkalemia: Secondary | ICD-10-CM | POA: Diagnosis not present

## 2022-09-23 DIAGNOSIS — B351 Tinea unguium: Secondary | ICD-10-CM | POA: Diagnosis not present

## 2022-09-23 DIAGNOSIS — E1122 Type 2 diabetes mellitus with diabetic chronic kidney disease: Secondary | ICD-10-CM | POA: Diagnosis not present

## 2022-09-25 ENCOUNTER — Other Ambulatory Visit: Payer: Self-pay | Admitting: Pharmacist

## 2022-09-25 DIAGNOSIS — I152 Hypertension secondary to endocrine disorders: Secondary | ICD-10-CM

## 2022-09-25 DIAGNOSIS — N183 Chronic kidney disease, stage 3 unspecified: Secondary | ICD-10-CM

## 2022-09-25 MED ORDER — LISINOPRIL 10 MG PO TABS: 10.0000 mg | ORAL_TABLET | Freq: Every day | ORAL | 3 refills | Status: DC

## 2022-10-08 MED ORDER — LISINOPRIL 10 MG PO TABS: 10.0000 mg | ORAL_TABLET | Freq: Every day | ORAL | 3 refills | Status: DC

## 2022-10-08 NOTE — Progress Notes (Signed)
    10/08/2022 Name: David Zamora MRN: [id number] DOB: [birth year]   Patient at risk for [facility].  Refill was never sent for lisinopril.  Patient resides in group home and uses pill packaging system from [facility] pharmacy in [place].  Confirmed patient is still taking. Refill sent in 09/25/22.     Kieth Brightly, PharmD, BCACP Clinical Pharmacist, [facility]

## 2022-11-03 ENCOUNTER — Other Ambulatory Visit: Payer: 59

## 2022-11-20 ENCOUNTER — Other Ambulatory Visit: Payer: Self-pay | Admitting: Family Medicine

## 2022-11-27 ENCOUNTER — Other Ambulatory Visit: Payer: Self-pay | Admitting: Family Medicine

## 2022-12-08 ENCOUNTER — Other Ambulatory Visit: Payer: Self-pay | Admitting: Family Medicine

## 2022-12-19 ENCOUNTER — Other Ambulatory Visit: Payer: Self-pay | Admitting: Family Medicine

## 2022-12-26 ENCOUNTER — Other Ambulatory Visit: Payer: Self-pay | Admitting: Family Medicine

## 2023-01-01 ENCOUNTER — Other Ambulatory Visit: Payer: Self-pay | Admitting: Nurse Practitioner

## 2023-01-02 ENCOUNTER — Telehealth: Payer: Self-pay | Admitting: Family Medicine

## 2023-01-02 ENCOUNTER — Ambulatory Visit: Payer: 59 | Admitting: Nurse Practitioner

## 2023-01-02 DIAGNOSIS — N184 Chronic kidney disease, stage 4 (severe): Secondary | ICD-10-CM

## 2023-01-02 DIAGNOSIS — R946 Abnormal results of thyroid function studies: Secondary | ICD-10-CM

## 2023-01-02 DIAGNOSIS — Z794 Long term (current) use of insulin: Secondary | ICD-10-CM

## 2023-01-02 DIAGNOSIS — I152 Hypertension secondary to endocrine disorders: Secondary | ICD-10-CM

## 2023-01-02 DIAGNOSIS — E782 Mixed hyperlipidemia: Secondary | ICD-10-CM

## 2023-01-02 DIAGNOSIS — Z7984 Long term (current) use of oral hypoglycemic drugs: Secondary | ICD-10-CM

## 2023-01-02 NOTE — Telephone Encounter (Signed)
Caregiver has been notified.

## 2023-01-02 NOTE — Telephone Encounter (Signed)
Caregiver called to report to Dr Dettinger that pt tested positive for covid about an hour ago. Not showing any signs of symptoms other than a little congestion. Wants to know what Dr Dettinger advises for pt to take OTC.

## 2023-01-02 NOTE — Telephone Encounter (Signed)
Mucinex and Flonase and Benadryl at night can help with the congestion.  If any symptoms worsen then please let us know

## 2023-01-15 ENCOUNTER — Other Ambulatory Visit (HOSPITAL_COMMUNITY)
Admission: RE | Admit: 2023-01-15 | Discharge: 2023-01-15 | Disposition: A | Discharge status: Home / Self Care | Payer: 59 | Source: Ambulatory Visit | Attending: Nephrology | Admitting: Nephrology

## 2023-01-15 DIAGNOSIS — E875 Hyperkalemia: Secondary | ICD-10-CM | POA: Diagnosis not present

## 2023-01-15 DIAGNOSIS — E1122 Type 2 diabetes mellitus with diabetic chronic kidney disease: Secondary | ICD-10-CM | POA: Insufficient documentation

## 2023-01-15 DIAGNOSIS — I129 Hypertensive chronic kidney disease with stage 1 through stage 4 chronic kidney disease, or unspecified chronic kidney disease: Secondary | ICD-10-CM | POA: Diagnosis not present

## 2023-01-15 DIAGNOSIS — R809 Proteinuria, unspecified: Secondary | ICD-10-CM | POA: Insufficient documentation

## 2023-01-15 DIAGNOSIS — E1129 Type 2 diabetes mellitus with other diabetic kidney complication: Secondary | ICD-10-CM | POA: Diagnosis not present

## 2023-01-15 DIAGNOSIS — D631 Anemia in chronic kidney disease: Secondary | ICD-10-CM | POA: Insufficient documentation

## 2023-01-15 DIAGNOSIS — N189 Chronic kidney disease, unspecified: Secondary | ICD-10-CM | POA: Insufficient documentation

## 2023-01-15 LAB — RENAL FUNCTION PANEL
Albumin: 3.4 g/dL — ABNORMAL LOW (ref 3.5–5.0)
Anion gap: 10 (ref 5–15)
BUN: 32 mg/dL — ABNORMAL HIGH (ref 8–23)
CO2: 25 mmol/L (ref 22–32)
Calcium: 8.7 mg/dL — ABNORMAL LOW (ref 8.9–10.3)
Chloride: 102 mmol/L (ref 98–111)
Creatinine, Ser: 2.34 mg/dL — ABNORMAL HIGH (ref 0.61–1.24)
GFR, Estimated: 30 mL/min — ABNORMAL LOW (ref >60)
Glucose, Bld: 350 mg/dL — ABNORMAL HIGH (ref 70–99)
Phosphorus: 3.3 mg/dL (ref 2.5–4.6)
Potassium: 4.4 mmol/L (ref 3.5–5.1)
Sodium: 137 mmol/L (ref 135–145)

## 2023-01-15 LAB — IRON AND TIBC
Iron: 108 ug/dL (ref 45–182)
Saturation Ratios: 30 % (ref 17.9–39.5)
TIBC: 361 ug/dL (ref 250–450)
UIBC: 253 ug/dL

## 2023-01-15 LAB — CBC
HCT: 38 % — ABNORMAL LOW (ref 39.0–52.0)
Hemoglobin: 12.7 g/dL — ABNORMAL LOW (ref 13.0–17.0)
MCH: 32.6 pg (ref 26.0–34.0)
MCHC: 33.4 g/dL (ref 30.0–36.0)
MCV: 97.4 fL (ref 80.0–100.0)
Platelets: 286 10*3/uL (ref 150–400)
RBC: 3.9 MIL/uL — ABNORMAL LOW (ref 4.22–5.81)
RDW: 12.4 % (ref 11.5–15.5)
WBC: 7.2 10*3/uL (ref 4.0–10.5)
nRBC: 0 % (ref 0.0–0.2)

## 2023-01-15 LAB — PROTEIN / CREATININE RATIO, URINE
Creatinine, Urine: 154 mg/dL
Protein Creatinine Ratio: 2.02 mg/mg{creat} — ABNORMAL HIGH (ref 0.00–0.15)
Total Protein, Urine: 311 mg/dL

## 2023-01-15 LAB — FERRITIN: Ferritin: 47 ng/mL (ref 24–336)

## 2023-01-16 DIAGNOSIS — E1129 Type 2 diabetes mellitus with other diabetic kidney complication: Secondary | ICD-10-CM | POA: Diagnosis not present

## 2023-01-16 DIAGNOSIS — E1122 Type 2 diabetes mellitus with diabetic chronic kidney disease: Secondary | ICD-10-CM | POA: Diagnosis not present

## 2023-01-16 DIAGNOSIS — R809 Proteinuria, unspecified: Secondary | ICD-10-CM | POA: Diagnosis not present

## 2023-01-16 DIAGNOSIS — I129 Hypertensive chronic kidney disease with stage 1 through stage 4 chronic kidney disease, or unspecified chronic kidney disease: Secondary | ICD-10-CM | POA: Diagnosis not present

## 2023-01-16 LAB — PARATHYROID HORMONE, INTACT (NO CA): PTH: 52 pg/mL (ref 15–65)

## 2023-01-23 DIAGNOSIS — N184 Chronic kidney disease, stage 4 (severe): Secondary | ICD-10-CM | POA: Diagnosis not present

## 2023-01-23 DIAGNOSIS — E1122 Type 2 diabetes mellitus with diabetic chronic kidney disease: Secondary | ICD-10-CM | POA: Diagnosis not present

## 2023-01-23 DIAGNOSIS — Z794 Long term (current) use of insulin: Secondary | ICD-10-CM | POA: Diagnosis not present

## 2023-01-24 LAB — COMPREHENSIVE METABOLIC PANEL
ALT: 8 [IU]/L (ref 0–44)
AST: 15 [IU]/L (ref 0–40)
Albumin: 3.8 g/dL — ABNORMAL LOW (ref 3.9–4.9)
Alkaline Phosphatase: 60 [IU]/L (ref 44–121)
BUN/Creatinine Ratio: 12 (ref 10–24)
BUN: 29 mg/dL — ABNORMAL HIGH (ref 8–27)
Bilirubin Total: 0.3 mg/dL (ref 0.0–1.2)
CO2: 25 mmol/L (ref 20–29)
Calcium: 9.7 mg/dL (ref 8.6–10.2)
Chloride: 99 mmol/L (ref 96–106)
Creatinine, Ser: 2.43 mg/dL — ABNORMAL HIGH (ref 0.76–1.27)
Globulin, Total: 2.2 g/dL (ref 1.5–4.5)
Glucose: 165 mg/dL — ABNORMAL HIGH (ref 70–99)
Potassium: 4.9 mmol/L (ref 3.5–5.2)
Sodium: 140 mmol/L (ref 134–144)
Total Protein: 6 g/dL (ref 6.0–8.5)
eGFR: 29 mL/min/{1.73_m2} — ABNORMAL LOW (ref >59)

## 2023-01-24 LAB — VITAMIN D 25 HYDROXY (VIT D DEFICIENCY, FRACTURES): Vit D, 25-Hydroxy: 52.7 ng/mL (ref 30.0–100.0)

## 2023-01-24 LAB — TSH: TSH: 4.42 u[IU]/mL (ref 0.450–4.500)

## 2023-01-24 LAB — T4, FREE: Free T4: 0.88 ng/dL (ref 0.82–1.77)

## 2023-01-24 LAB — LIPID PANEL
Chol/HDL Ratio: 4.1 {ratio} (ref 0.0–5.0)
Cholesterol, Total: 176 mg/dL (ref 100–199)
HDL: 43 mg/dL (ref >39)
LDL Chol Calc (NIH): 92 mg/dL (ref 0–99)
Triglycerides: 244 mg/dL — ABNORMAL HIGH (ref 0–149)
VLDL Cholesterol Cal: 41 mg/dL — ABNORMAL HIGH (ref 5–40)

## 2023-01-29 ENCOUNTER — Ambulatory Visit: Payer: 59 | Admitting: Nurse Practitioner

## 2023-01-29 DIAGNOSIS — E782 Mixed hyperlipidemia: Secondary | ICD-10-CM

## 2023-01-29 DIAGNOSIS — E1122 Type 2 diabetes mellitus with diabetic chronic kidney disease: Secondary | ICD-10-CM

## 2023-01-29 DIAGNOSIS — E1159 Type 2 diabetes mellitus with other circulatory complications: Secondary | ICD-10-CM

## 2023-01-29 DIAGNOSIS — Z794 Long term (current) use of insulin: Secondary | ICD-10-CM

## 2023-01-30 ENCOUNTER — Ambulatory Visit (INDEPENDENT_AMBULATORY_CARE_PROVIDER_SITE_OTHER): Payer: 59 | Admitting: Nurse Practitioner

## 2023-01-30 ENCOUNTER — Encounter: Payer: Self-pay | Admitting: Nurse Practitioner

## 2023-01-30 VITALS — BP 122/70 | HR 80 | Ht 68.0 in | Wt 189.6 lb

## 2023-01-30 DIAGNOSIS — N184 Chronic kidney disease, stage 4 (severe): Secondary | ICD-10-CM | POA: Diagnosis not present

## 2023-01-30 DIAGNOSIS — E1122 Type 2 diabetes mellitus with diabetic chronic kidney disease: Secondary | ICD-10-CM

## 2023-01-30 DIAGNOSIS — E781 Pure hyperglyceridemia: Secondary | ICD-10-CM | POA: Diagnosis not present

## 2023-01-30 DIAGNOSIS — E1165 Type 2 diabetes mellitus with hyperglycemia: Secondary | ICD-10-CM | POA: Diagnosis not present

## 2023-01-30 DIAGNOSIS — Z794 Long term (current) use of insulin: Secondary | ICD-10-CM | POA: Diagnosis not present

## 2023-01-30 DIAGNOSIS — N183 Chronic kidney disease, stage 3 unspecified: Secondary | ICD-10-CM

## 2023-01-30 DIAGNOSIS — E1169 Type 2 diabetes mellitus with other specified complication: Secondary | ICD-10-CM | POA: Diagnosis not present

## 2023-01-30 LAB — POCT GLYCOSYLATED HEMOGLOBIN (HGB A1C): Hemoglobin A1C: 8.8 % — AB (ref 4.0–5.6)

## 2023-01-30 MED ORDER — TRUEPLUS PEN NEEDLES 31G X 8 MM MISC: 3 refills | Status: AC

## 2023-01-30 MED ORDER — ACCU-CHEK SOFTCLIX LANCETS MISC: 12 refills | Status: DC

## 2023-01-30 MED ORDER — ACCU-CHEK GUIDE VI STRP: ORAL_STRIP | 6 refills | Status: DC

## 2023-01-30 MED ORDER — TRESIBA FLEXTOUCH 100 UNIT/ML ~~LOC~~ SOPN: 20.0000 [IU] | PEN_INJECTOR | Freq: Every day | SUBCUTANEOUS | 3 refills | Status: DC

## 2023-01-30 MED ORDER — OZEMPIC (1 MG/DOSE) 4 MG/3ML ~~LOC~~ SOPN: 1.0000 mg | PEN_INJECTOR | SUBCUTANEOUS | 0 refills | Status: DC
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
## 2023-02-03 DIAGNOSIS — B351 Tinea unguium: Secondary | ICD-10-CM | POA: Diagnosis not present

## 2023-02-03 DIAGNOSIS — L03039 Cellulitis of unspecified toe: Secondary | ICD-10-CM | POA: Diagnosis not present

## 2023-02-11 ENCOUNTER — Other Ambulatory Visit: Payer: Self-pay | Admitting: Family Medicine

## 2023-02-20 ENCOUNTER — Other Ambulatory Visit: Payer: Self-pay | Admitting: Family Medicine

## 2023-02-20 DIAGNOSIS — E1169 Type 2 diabetes mellitus with other specified complication: Secondary | ICD-10-CM

## 2023-02-20 DIAGNOSIS — Z794 Long term (current) use of insulin: Secondary | ICD-10-CM

## 2023-02-20 MED ORDER — SIMVASTATIN 40 MG PO TABS: ORAL_TABLET | ORAL | 30 refills | Status: DC

## 2023-02-20 NOTE — Telephone Encounter (Unsigned)
Copied from CRM [id number]. Topic: Clinical - Medication Refill >> Feb 20, 2023  3:11 PM Tiffany H wrote: Most Recent Primary Care Visit:  Provider: [facility]  Department: [facility]  Visit Type: PHYSICAL  Date: 09/11/2022  Medication: Simvastatin 40mg  1x   Has the patient contacted their pharmacy? Yes (Agent: If no, request that the patient contact the pharmacy for the refill. If patient does not wish to contact the pharmacy document the reason why and proceed with request.) (Agent: If yes, when and what did the pharmacy advise?)  Is this the correct pharmacy for this prescription? No If no, delete pharmacy and type the correct one.  This is the patient's preferred pharmacy:  [facility] - [place], [place] - [address] [place] [address] Phone: [phone] Fax: [phone]  [facility] - [place], [place] - [address] [place] [address] Phone: [phone] Fax: [phone]  [id number] - [place], [place] - [address] [place] [address] Phone: [phone] Fax: [phone]   Has the prescription been filled recently? No  Is the patient out of the medication? Yes  Has the patient been seen for an appointment in the last year OR does the patient have an upcoming appointment? No  Can we respond through MyChart? No  Agent: Please be advised that Rx refills may take up to 3 business days. We ask that you follow-up with your pharmacy.

## 2023-02-27 ENCOUNTER — Telehealth: Payer: Self-pay | Admitting: Family Medicine

## 2023-03-09 ENCOUNTER — Telehealth: Payer: Self-pay

## 2023-03-09 ENCOUNTER — Ambulatory Visit (INDEPENDENT_AMBULATORY_CARE_PROVIDER_SITE_OTHER): Payer: 59 | Admitting: Family Medicine

## 2023-03-09 ENCOUNTER — Encounter: Payer: Self-pay | Admitting: Family Medicine

## 2023-03-09 VITALS — BP 115/76 | HR 99 | Temp 98.4°F | Ht 68.0 in | Wt 189.0 lb

## 2023-03-09 DIAGNOSIS — E785 Hyperlipidemia, unspecified: Secondary | ICD-10-CM | POA: Diagnosis not present

## 2023-03-09 DIAGNOSIS — K219 Gastro-esophageal reflux disease without esophagitis: Secondary | ICD-10-CM

## 2023-03-09 DIAGNOSIS — E1159 Type 2 diabetes mellitus with other circulatory complications: Secondary | ICD-10-CM

## 2023-03-09 DIAGNOSIS — N179 Acute kidney failure, unspecified: Secondary | ICD-10-CM

## 2023-03-09 DIAGNOSIS — N183 Chronic kidney disease, stage 3 unspecified: Secondary | ICD-10-CM

## 2023-03-09 DIAGNOSIS — E1169 Type 2 diabetes mellitus with other specified complication: Secondary | ICD-10-CM

## 2023-03-09 DIAGNOSIS — E1122 Type 2 diabetes mellitus with diabetic chronic kidney disease: Secondary | ICD-10-CM | POA: Diagnosis not present

## 2023-03-09 DIAGNOSIS — Z794 Long term (current) use of insulin: Secondary | ICD-10-CM | POA: Diagnosis not present

## 2023-03-09 DIAGNOSIS — R634 Abnormal weight loss: Secondary | ICD-10-CM

## 2023-03-09 DIAGNOSIS — N1832 Chronic kidney disease, stage 3b: Secondary | ICD-10-CM

## 2023-03-09 DIAGNOSIS — I152 Hypertension secondary to endocrine disorders: Secondary | ICD-10-CM

## 2023-03-09 MED ORDER — OZEMPIC (1 MG/DOSE) 4 MG/3ML ~~LOC~~ SOPN: 1.0000 mg | PEN_INJECTOR | SUBCUTANEOUS | 3 refills | Status: DC

## 2023-03-09 MED ORDER — OMEPRAZOLE 40 MG PO CPDR: 40.0000 mg | DELAYED_RELEASE_CAPSULE | Freq: Every day | ORAL | 3 refills | Status: DC

## 2023-03-09 MED ORDER — ICOSAPENT ETHYL 1 G PO CAPS: 2.0000 g | ORAL_CAPSULE | Freq: Two times a day (BID) | ORAL | 3 refills | Status: DC

## 2023-03-09 NOTE — Progress Notes (Signed)
BP 115/76   Pulse 99   Temp 98.4 F (36.9 C)   Ht 5\' 8"  (1.727 m)   Wt 189 lb (85.7 kg)   SpO2 96%   BMI 28.74 kg/m    Subjective:   Patient ID: David Zamora, male    DOB: [birth year], 62 y.o.   MRN: [id number]  HPI: David Zamora is a 62 y.o. male presenting on 03/09/2023 for Medical Management of Chronic Issues, Hypertension, Hyperlipidemia, and Diabetes   HPI Type 2 diabetes mellitus Patient comes in today for recheck of his diabetes. Patient has been currently taking Ozempic and [place]. Patient is currently on an ACE inhibitor/ARB. Patient has seen an ophthalmologist this year. Patient denies any new issues with their feet. The symptom started onset as an adult hypertension and hyperlipidemia ARE RELATED TO DM.  Blood sugars are running good in the a.m. of the right arm in the PM.  Recommend focusing on diet.  Hypertension Patient is currently on lisinopril, and their blood pressure today is 115/76. Patient denies any lightheadedness or dizziness. Patient denies headaches, blurred vision, chest pains, shortness of breath, or weakness. Denies any side effects from medication and is content with current medication.   Hyperlipidemia Patient is coming in for recheck of his hyperlipidemia. The patient is currently taking simvastatin Vascepa. They deny any issues with myalgias or history of liver damage from it. They deny any focal numbness or weakness or chest pain.   GERD Patient is currently on omeprazole.  She denies any major symptoms or abdominal pain or belching or burping. She denies any blood in her stool or lightheadedness or dizziness.   Relevant past medical, surgical, family and social history reviewed and updated as indicated. Interim medical history since our last visit reviewed. Allergies and medications reviewed and updated.  Review of Systems  Constitutional:  Negative for chills and fever.  Eyes:  Negative for visual disturbance.  Respiratory:   Negative for shortness of breath and wheezing.   Cardiovascular:  Negative for chest pain and leg swelling.  Musculoskeletal:  Negative for back pain and gait problem.  Skin:  Negative for rash.  Neurological:  Negative for dizziness and numbness.  All other systems reviewed and are negative.   Per HPI unless specifically indicated above   Allergies as of 03/09/2023       Reactions   Soap    White soaps        Medication List        Accurate as of March 09, 2023  1:11 PM. If you have any questions, ask your nurse or doctor.          STOP taking these medications    cyanocobalamin 2000 MCG tablet Stopped by: Elige Radon Sharetha Newson   fluticasone 50 MCG/ACT nasal spray Commonly known as: FLONASE Stopped by: Elige Radon Syd Newsome   furosemide 20 MG tablet Commonly known as: LASIX Stopped by: Elige Radon Zarie Kosiba   meclizine 12.5 MG tablet Commonly known as: ANTIVERT Stopped by: Elige Radon Cameshia Cressman       TAKE these medications    Accu-Chek Guide test strip Generic drug: glucose blood Use as instructed to monitor glucose twice daily.   Accu-Chek Softclix Lancets lancets Use as instructed to monitor glucose twice daily   blood glucose meter kit and supplies Dispense based on patient and insurance preference. Use up to four times daily as directed. (FOR ICD-10 E10.9, E11.9).   blood glucose meter kit and supplies Kit Dispense  based on patient and insurance preference. Use up to four times daily as directed. (FOR ICD-9 250.00, 250.01).   buPROPion 150 MG 24 hr tablet Commonly known as: WELLBUTRIN XL Take 150 mg by mouth every morning.   CeraVe Crea APPLY 1 APPLICATION ONCE A DAY TO BOTH FEET   cycloSPORINE 0.05 % ophthalmic emulsion Commonly known as: RESTASIS 1 drop 2 (two) times daily.   dapagliflozin propanediol 5 MG Tabs tablet Commonly known as: Farxiga Take 1 tablet (5 mg total) by mouth daily.   divalproex 250 MG 24 hr tablet Commonly known as:  DEPAKOTE ER Take 250-1,000 mg by mouth See admin instructions. 250mg  in the morning and 1000mg   at bedtime   Global Ease Inject Pen Needles 31G X 5 MM Misc Generic drug: Insulin Pen Needle SMARTSIG:Syringe(s) SUB-Q   TRUEplus Pen Needles 31G X 8 MM Misc Generic drug: Insulin Pen Needle Use to inject insulin once daily   glucose monitoring kit monitoring kit 1 each by Does not apply route as needed for other.   ibuprofen 200 MG tablet Commonly known as: Motrin IB Take 2 tablets (400 mg total) by mouth daily.   icosapent Ethyl 1 g capsule Commonly known as: Vascepa Take 2 capsules (2 g total) by mouth 2 (two) times daily.   lisinopril 10 MG tablet Commonly known as: ZESTRIL Take 1 tablet (10 mg total) by mouth daily.   Lokelma 5 g packet Generic drug: sodium zirconium cyclosilicate Take 5 g by mouth.   loperamide 2 MG tablet Commonly known as: Imodium A-D Take 1 tablet (2 mg total) by mouth 4 (four) times daily as needed for diarrhea or loose stools.   omeprazole 40 MG capsule Commonly known as: PRILOSEC Take 1 capsule (40 mg total) by mouth daily.   Ozempic (1 MG/DOSE) 4 MG/3ML Sopn Generic drug: Semaglutide (1 MG/DOSE) Inject 1 mg into the skin once a week.   Pain Relief Extra Strength 500 MG tablet Generic drug: acetaminophen Take 1 tablet (500 mg total) by mouth every 6 (six) hours as needed.   PARoxetine 30 MG tablet Commonly known as: PAXIL Take 30 mg by mouth daily.   QUEtiapine 50 MG Tb24 24 hr tablet Commonly known as: SEROQUEL XR Take 50 mg by mouth daily.   simvastatin 40 MG tablet Commonly known as: ZOCOR One tablet daily   tamsulosin 0.4 MG Caps capsule Commonly known as: FLOMAX Take 1 capsule (0.4 mg total) by mouth daily.   Tegaderm Film 6"x8" Misc Use to cover free style libre sensor   tolnaftate 1 % spray Commonly known as: TINACTIN Apply topically 2 (two) times daily.   Athletes Foot Spray 1 % Aero Generic drug: Tolnaftate APPLY  TOPICALLY 2 TIMES A DAY.   Evaristo Bury FlexTouch 100 UNIT/ML FlexTouch Pen Generic drug: insulin degludec Inject 20 Units into the skin at bedtime.   triamcinolone ointment 0.5 % Commonly known as: KENALOG APPLY TOPICALLY TWICE DAILY.   Vitamin D (Ergocalciferol) 1.25 MG (50000 UNIT) Caps capsule Commonly known as: DRISDOL TAKE 1 CAPSULE BY MOUTH ONCE A WEEK ON SATURDAYS         Objective:   BP 115/76   Pulse 99   Temp 98.4 F (36.9 C)   Ht 5\' 8"  (1.727 m)   Wt 189 lb (85.7 kg)   SpO2 96%   BMI 28.74 kg/m   Wt Readings from Last 3 Encounters:  03/09/23 189 lb (85.7 kg)  01/30/23 189 lb 9.6 oz (86 kg)  09/11/22 183  lb (83 kg)    Physical Exam Vitals and nursing note reviewed.  Constitutional:      General: He is not in acute distress.    Appearance: He is well-developed. He is not diaphoretic.  Eyes:     General: No scleral icterus.    Conjunctiva/sclera: Conjunctivae normal.  Neck:     Thyroid: No thyromegaly.  Cardiovascular:     Rate and Rhythm: Normal rate and regular rhythm.     Heart sounds: Normal heart sounds. No murmur heard. Pulmonary:     Effort: Pulmonary effort is normal. No respiratory distress.     Breath sounds: Normal breath sounds. No wheezing.  Musculoskeletal:        General: Normal range of motion.     Cervical back: Neck supple.  Lymphadenopathy:     Cervical: No cervical adenopathy.  Skin:    General: Skin is warm and dry.     Findings: No rash.  Neurological:     Mental Status: He is alert and oriented to person, place, and time.     Coordination: Coordination normal.  Psychiatric:        Behavior: Behavior normal.       Assessment & Plan:   Problem List Items Addressed This Visit       Cardiovascular and Mediastinum   Hypertension associated with diabetes (HCC)   Relevant Medications   icosapent Ethyl (VASCEPA) 1 g capsule   Semaglutide, 1 MG/DOSE, (OZEMPIC, 1 MG/DOSE,) 4 MG/3ML SOPN     Digestive   Gastroesophageal  reflux disease   Relevant Medications   omeprazole (PRILOSEC) 40 MG capsule     Endocrine   Controlled type 2 diabetes mellitus with stage 3 chronic kidney disease, with long-term current use of insulin (HCC)   Relevant Medications   Semaglutide, 1 MG/DOSE, (OZEMPIC, 1 MG/DOSE,) 4 MG/3ML SOPN   CKD stage 3 due to type 2 diabetes mellitus (HCC)   Relevant Medications   Semaglutide, 1 MG/DOSE, (OZEMPIC, 1 MG/DOSE,) 4 MG/3ML SOPN   Other Relevant Orders   BMP8+EGFR   Hyperlipidemia associated with type 2 diabetes mellitus (HCC) - Primary   Relevant Medications   icosapent Ethyl (VASCEPA) 1 g capsule   Semaglutide, 1 MG/DOSE, (OZEMPIC, 1 MG/DOSE,) 4 MG/3ML SOPN   Other Visit Diagnoses     Acute renal failure superimposed on stage 3b chronic kidney disease, unspecified acute renal failure type (HCC)       Relevant Orders   BMP8+EGFR   Weight loss, unintentional       Relevant Medications   omeprazole (PRILOSEC) 40 MG capsule     Continue current medicine, continue to see endocrinology.  Will recheck renal function today and recommended hydration increase especially with water.  Spoke with patient's caregiver about this and going to encourage it.  Follow up plan: Return in about 3 months (around 06/09/2023), or if symptoms worsen or fail to improve, for Diabetes and CKD and hypertension.  Counseling provided for all of the vaccine components Orders Placed This Encounter  Procedures   BMP8+EGFR    Arville Care, MD [facility] 03/09/2023, 1:11 PM

## 2023-03-09 NOTE — Patient Outreach (Signed)
Attempted to contact patient regarding care gaps. Left voicemail for patient to return my call at [phone].  Nicholes Rough, CMA Care Guide [employer]

## 2023-03-10 LAB — BMP8+EGFR
BUN/Creatinine Ratio: 16 (ref 10–24)
BUN: 39 mg/dL — ABNORMAL HIGH (ref 8–27)
CO2: 22 mmol/L (ref 20–29)
Calcium: 8.8 mg/dL (ref 8.6–10.2)
Chloride: 104 mmol/L (ref 96–106)
Creatinine, Ser: 2.5 mg/dL — ABNORMAL HIGH (ref 0.76–1.27)
Glucose: 168 mg/dL — ABNORMAL HIGH (ref 70–99)
Potassium: 4.8 mmol/L (ref 3.5–5.2)
Sodium: 142 mmol/L (ref 134–144)
eGFR: 28 mL/min/{1.73_m2} — ABNORMAL LOW (ref >59)

## 2023-04-21 ENCOUNTER — Other Ambulatory Visit (HOSPITAL_COMMUNITY)
Admission: RE | Admit: 2023-04-21 | Discharge: 2023-04-21 | Disposition: A | Discharge status: Home / Self Care | Payer: 59 | Source: Ambulatory Visit | Attending: Nephrology | Admitting: Nephrology

## 2023-04-21 DIAGNOSIS — D638 Anemia in other chronic diseases classified elsewhere: Secondary | ICD-10-CM | POA: Insufficient documentation

## 2023-04-21 DIAGNOSIS — N189 Chronic kidney disease, unspecified: Secondary | ICD-10-CM | POA: Diagnosis not present

## 2023-04-21 DIAGNOSIS — R809 Proteinuria, unspecified: Secondary | ICD-10-CM | POA: Insufficient documentation

## 2023-04-21 DIAGNOSIS — I129 Hypertensive chronic kidney disease with stage 1 through stage 4 chronic kidney disease, or unspecified chronic kidney disease: Secondary | ICD-10-CM | POA: Diagnosis not present

## 2023-04-21 DIAGNOSIS — E1129 Type 2 diabetes mellitus with other diabetic kidney complication: Secondary | ICD-10-CM | POA: Diagnosis not present

## 2023-04-21 DIAGNOSIS — E1122 Type 2 diabetes mellitus with diabetic chronic kidney disease: Secondary | ICD-10-CM | POA: Insufficient documentation

## 2023-04-21 LAB — RENAL FUNCTION PANEL
Albumin: 3.4 g/dL — ABNORMAL LOW (ref 3.5–5.0)
Anion gap: 6 (ref 5–15)
BUN: 39 mg/dL — ABNORMAL HIGH (ref 8–23)
CO2: 26 mmol/L (ref 22–32)
Calcium: 8.6 mg/dL — ABNORMAL LOW (ref 8.9–10.3)
Chloride: 104 mmol/L (ref 98–111)
Creatinine, Ser: 2.69 mg/dL — ABNORMAL HIGH (ref 0.61–1.24)
GFR, Estimated: 26 mL/min — ABNORMAL LOW (ref >60)
Glucose, Bld: 111 mg/dL — ABNORMAL HIGH (ref 70–99)
Phosphorus: 4.4 mg/dL (ref 2.5–4.6)
Potassium: 4.7 mmol/L (ref 3.5–5.1)
Sodium: 136 mmol/L (ref 135–145)

## 2023-04-21 LAB — PROTEIN / CREATININE RATIO, URINE
Creatinine, Urine: 130 mg/dL
Protein Creatinine Ratio: 0.96 mg/mg{creat} — ABNORMAL HIGH (ref 0.00–0.15)
Total Protein, Urine: 125 mg/dL

## 2023-04-21 LAB — CBC
HCT: 40.8 % (ref 39.0–52.0)
Hemoglobin: 13.8 g/dL (ref 13.0–17.0)
MCH: 33.6 pg (ref 26.0–34.0)
MCHC: 33.8 g/dL (ref 30.0–36.0)
MCV: 99.3 fL (ref 80.0–100.0)
Platelets: 236 10*3/uL (ref 150–400)
RBC: 4.11 MIL/uL — ABNORMAL LOW (ref 4.22–5.81)
RDW: 12.2 % (ref 11.5–15.5)
WBC: 6.5 10*3/uL (ref 4.0–10.5)
nRBC: 0 % (ref 0.0–0.2)

## 2023-04-22 LAB — PARATHYROID HORMONE, INTACT (NO CA): PTH: 32 pg/mL (ref 15–65)

## 2023-04-24 DIAGNOSIS — E1122 Type 2 diabetes mellitus with diabetic chronic kidney disease: Secondary | ICD-10-CM | POA: Diagnosis not present

## 2023-04-24 DIAGNOSIS — R809 Proteinuria, unspecified: Secondary | ICD-10-CM | POA: Diagnosis not present

## 2023-04-24 DIAGNOSIS — E1129 Type 2 diabetes mellitus with other diabetic kidney complication: Secondary | ICD-10-CM | POA: Diagnosis not present

## 2023-04-24 DIAGNOSIS — I129 Hypertensive chronic kidney disease with stage 1 through stage 4 chronic kidney disease, or unspecified chronic kidney disease: Secondary | ICD-10-CM | POA: Diagnosis not present

## 2023-04-30 ENCOUNTER — Other Ambulatory Visit (HOSPITAL_COMMUNITY): Payer: Self-pay

## 2023-05-12 ENCOUNTER — Encounter: Payer: Self-pay | Admitting: Nurse Practitioner

## 2023-05-12 ENCOUNTER — Ambulatory Visit (INDEPENDENT_AMBULATORY_CARE_PROVIDER_SITE_OTHER): Payer: 59 | Admitting: Nurse Practitioner

## 2023-05-12 VITALS — BP 114/72 | HR 72 | Ht 68.0 in | Wt 190.0 lb

## 2023-05-12 DIAGNOSIS — I152 Hypertension secondary to endocrine disorders: Secondary | ICD-10-CM | POA: Diagnosis not present

## 2023-05-12 DIAGNOSIS — E1159 Type 2 diabetes mellitus with other circulatory complications: Secondary | ICD-10-CM

## 2023-05-12 DIAGNOSIS — E782 Mixed hyperlipidemia: Secondary | ICD-10-CM

## 2023-05-12 DIAGNOSIS — N184 Chronic kidney disease, stage 4 (severe): Secondary | ICD-10-CM

## 2023-05-12 DIAGNOSIS — E1122 Type 2 diabetes mellitus with diabetic chronic kidney disease: Secondary | ICD-10-CM | POA: Diagnosis not present

## 2023-05-12 DIAGNOSIS — Z794 Long term (current) use of insulin: Secondary | ICD-10-CM

## 2023-05-12 LAB — POCT GLYCOSYLATED HEMOGLOBIN (HGB A1C): Hemoglobin A1C: 6.7 % — AB (ref 4.0–5.6)

## 2023-05-12 MED ORDER — TRESIBA FLEXTOUCH 100 UNIT/ML ~~LOC~~ SOPN: 15.0000 [IU] | PEN_INJECTOR | Freq: Every day | SUBCUTANEOUS | 3 refills | Status: DC
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
## 2023-05-14 ENCOUNTER — Other Ambulatory Visit: Payer: Self-pay | Admitting: *Deleted

## 2023-05-14 DIAGNOSIS — Z87891 Personal history of nicotine dependence: Secondary | ICD-10-CM

## 2023-05-14 DIAGNOSIS — Z122 Encounter for screening for malignant neoplasm of respiratory organs: Secondary | ICD-10-CM

## 2023-05-14 DIAGNOSIS — F17210 Nicotine dependence, cigarettes, uncomplicated: Secondary | ICD-10-CM

## 2023-05-20 ENCOUNTER — Other Ambulatory Visit: Payer: Self-pay | Admitting: Family Medicine

## 2023-05-20 DIAGNOSIS — E785 Hyperlipidemia, unspecified: Secondary | ICD-10-CM

## 2023-05-27 ENCOUNTER — Ambulatory Visit (INDEPENDENT_AMBULATORY_CARE_PROVIDER_SITE_OTHER): Payer: 59 | Admitting: Acute Care

## 2023-05-27 DIAGNOSIS — F17210 Nicotine dependence, cigarettes, uncomplicated: Secondary | ICD-10-CM | POA: Diagnosis not present

## 2023-05-27 NOTE — Patient Instructions (Signed)
smoking cessation ( As covered by your insurance benefits)  Hypnosis for smoking cessation  [employer] [phone]  Acupuncture for smoking cessation  [employer] [phone]

## 2023-05-27 NOTE — Progress Notes (Addendum)
 Provider Attestation I agree with the documentation of the Shared Decision Making visit,  smoking cessation counseling if appropriate, and verification or eligibility for lung cancer screening as documented by the RN Nurse Navigator.   Raejean Bullock, MSN, AGACNP-BC [facility] See Amion for personal pager PCCM on call pager [phone]      Virtual Visit via Telephone Note  I connected with Sabrina Crandall on 05/27/23 at 10:00 AM EST by telephone and verified that I am speaking with the correct person using two identifiers.  Location: Patient: David Zamora Provider: Alyse Bach, RN   I discussed the limitations, risks, security and privacy concerns of performing an evaluation and management service by telephone and the availability of in person appointments. I also discussed with the patient that there may be a patient responsible charge related to this service. The patient expressed understanding and agreed to proceed.   Shared Decision Making Visit Lung Cancer Screening Program [phone])   Eligibility: Age 63 y.o. Pack Years Smoking History Calculation 30 (# packs/per year x # years smoked) Recent History of coughing up blood  no Unexplained weight loss? no ( >Than 15 pounds within the last 6 months ) Prior History Lung / other cancer no (Diagnosis within the last 5 years already requiring surveillance chest CT Scans). Smoking Status Current Smoker Former Smokers: Years since quit: n/a  Quit Date: n/a  Visit Components: Discussion included one or more decision making aids. yes Discussion included risk/benefits of screening. yes Discussion included potential follow up diagnostic testing for abnormal scans. yes Discussion included meaning and risk of over diagnosis. yes Discussion included meaning and risk of False Positives. yes Discussion included meaning of total radiation exposure. yes  Counseling Included: Importance of adherence  to annual lung cancer LDCT screening. yes Impact of comorbidities on ability to participate in the program. yes Ability and willingness to under diagnostic treatment. yes  Smoking Cessation Counseling: Current Smokers:  Discussed importance of smoking cessation. yes Information about tobacco cessation classes and interventions provided to patient. yes Patient provided with "ticket" for LDCT Scan. no Symptomatic Patient. yes  Counseling(Intermediate counseling: > three minutes) 99406 Diagnosis Code: Tobacco Use Z72.0 Asymptomatic Patient yes  Counseling (Intermediate counseling: > three minutes counseling) [id number] Former Smokers:  Discussed the importance of maintaining cigarette abstinence. yes Diagnosis Code: Personal History of Nicotine  Dependence. V78.469 Information about tobacco cessation classes and interventions provided to patient. Yes Patient provided with "ticket" for LDCT Scan. no Written Order for Lung Cancer Screening with LDCT placed in Epic. Yes (CT Chest Lung Cancer Screening Low Dose W/O CM) [id number] Z12.2-Screening of respiratory organs Z87.891-Personal history of nicotine  dependence   Alyse Bach, RN

## 2023-05-29 ENCOUNTER — Ambulatory Visit: Payer: 59

## 2023-05-29 VITALS — Ht 68.0 in | Wt 190.0 lb

## 2023-05-29 DIAGNOSIS — Z0000 Encounter for general adult medical examination without abnormal findings: Secondary | ICD-10-CM | POA: Diagnosis not present

## 2023-05-29 DIAGNOSIS — Z1211 Encounter for screening for malignant neoplasm of colon: Secondary | ICD-10-CM

## 2023-05-29 NOTE — Patient Instructions (Signed)
David Zamora , Thank you for taking time to come for your Medicare Wellness Visit. I appreciate your ongoing commitment to your health goals. Please review the following plan we discussed and let me know if I can assist you in the future.   Referrals/Orders/Follow-Ups/Clinician Recommendations: Aim for 30 minutes of exercise or brisk walking, 6-8 glasses of water, and 5 servings of fruits and vegetables each day.  This is a list of the screening recommended for you and due dates:  Health Maintenance  Topic Date Due   Screening for Lung Cancer  Never done   Eye exam for diabetics  09/05/2015   COVID-19 Vaccine (4 - 2024-25 season) 12/14/2022   Cologuard (Stool DNA test)  04/12/2023   Pneumococcal Vaccination (4 of 4 - PPSV23 or PCV20) 08/20/2023   Complete foot exam   09/11/2023   Hemoglobin A1C  11/09/2023   Yearly kidney function blood test for diabetes  04/20/2024   Yearly kidney health urinalysis for diabetes  04/20/2024   Medicare Annual Wellness Visit  05/28/2024   DTaP/Tdap/Td vaccine (4 - Td or Tdap) 05/21/2031   Flu Shot  Completed   Hepatitis C Screening  Completed   HIV Screening  Completed   Zoster (Shingles) Vaccine  Completed   HPV Vaccine  Aged Out   Colon Cancer Screening  Discontinued    Advanced directives: (ACP Link)Information on Advanced Care Planning can be found at [facility] Advance Health Care Directives Advance Health Care Directives ([email or web address]   Next Medicare Annual Wellness Visit scheduled for next year: Yes
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
## 2023-05-31 ENCOUNTER — Ambulatory Visit (HOSPITAL_COMMUNITY)
Admission: RE | Admit: 2023-05-31 | Discharge: 2023-05-31 | Disposition: A | Discharge status: Home / Self Care | Payer: 59 | Source: Ambulatory Visit | Attending: Acute Care | Admitting: Acute Care

## 2023-05-31 DIAGNOSIS — F17210 Nicotine dependence, cigarettes, uncomplicated: Secondary | ICD-10-CM

## 2023-05-31 DIAGNOSIS — Z87891 Personal history of nicotine dependence: Secondary | ICD-10-CM | POA: Diagnosis not present

## 2023-05-31 DIAGNOSIS — Z122 Encounter for screening for malignant neoplasm of respiratory organs: Secondary | ICD-10-CM

## 2023-06-10 ENCOUNTER — Encounter: Payer: Self-pay | Admitting: Family Medicine

## 2023-06-10 ENCOUNTER — Ambulatory Visit (INDEPENDENT_AMBULATORY_CARE_PROVIDER_SITE_OTHER): Payer: 59 | Admitting: Family Medicine

## 2023-06-10 VITALS — BP 105/66 | HR 99 | Ht 68.0 in | Wt 189.0 lb

## 2023-06-10 DIAGNOSIS — E1159 Type 2 diabetes mellitus with other circulatory complications: Secondary | ICD-10-CM

## 2023-06-10 DIAGNOSIS — N183 Chronic kidney disease, stage 3 unspecified: Secondary | ICD-10-CM

## 2023-06-10 DIAGNOSIS — E1169 Type 2 diabetes mellitus with other specified complication: Secondary | ICD-10-CM | POA: Diagnosis not present

## 2023-06-10 DIAGNOSIS — Z794 Long term (current) use of insulin: Secondary | ICD-10-CM | POA: Diagnosis not present

## 2023-06-10 DIAGNOSIS — I152 Hypertension secondary to endocrine disorders: Secondary | ICD-10-CM

## 2023-06-10 DIAGNOSIS — E785 Hyperlipidemia, unspecified: Secondary | ICD-10-CM

## 2023-06-10 DIAGNOSIS — E1122 Type 2 diabetes mellitus with diabetic chronic kidney disease: Secondary | ICD-10-CM | POA: Diagnosis not present

## 2023-06-10 DIAGNOSIS — Z125 Encounter for screening for malignant neoplasm of prostate: Secondary | ICD-10-CM | POA: Diagnosis not present

## 2023-06-10 NOTE — Progress Notes (Signed)
 BP 105/66   Pulse 99   Ht 5\' 8"  (1.727 m)   Wt 189 lb (85.7 kg)   SpO2 98%   BMI 28.74 kg/m    Subjective:   Patient ID: David Zamora, male    DOB: April 26, 1960, 63 y.o.   MRN: [id number]  HPI: David Zamora is a 62 y.o. male presenting on 06/10/2023 for Medical Management of Chronic Issues, Hypertension, Hyperlipidemia, and Diabetes   HPI Type 2 diabetes mellitus Patient comes in today for recheck of his diabetes. Patient has been currently taking [place] and [place] and Ozempic. Patient is currently on an ACE inhibitor/ARB. Patient has not seen an ophthalmologist this year. Patient denies any new issues with their feet. The symptom started onset as an adult hypertension and hyperlipidemia and CKD ARE RELATED TO DM   Hypertension Patient is currently on simvastatin, and their blood pressure today is 105/66. Patient denies any lightheadedness or dizziness. Patient denies headaches, blurred vision, chest pains, shortness of breath, or weakness. Denies any side effects from medication and is content with current medication.   Hyperlipidemia Patient is coming in for recheck of his hyperlipidemia. The patient is currently taking simvastatin. They deny any issues with myalgias or history of liver damage from it. They deny any focal numbness or weakness or chest pain.   Relevant past medical, surgical, family and social history reviewed and updated as indicated. Interim medical history since our last visit reviewed. Allergies and medications reviewed and updated.  Review of Systems  Constitutional:  Negative for chills and fever.  Eyes:  Negative for visual disturbance.  Respiratory:  Negative for shortness of breath and wheezing.   Cardiovascular:  Negative for chest pain and leg swelling.  Musculoskeletal:  Negative for back pain and gait problem.  Skin:  Negative for rash.  Neurological:  Negative for dizziness, weakness and light-headedness.  All other systems reviewed and are  negative.   Per HPI unless specifically indicated above   Allergies as of 06/10/2023       Reactions   Soap    White soaps        Medication List        Accurate as of June 10, 2023  2:25 PM. If you have any questions, ask your nurse or doctor.          Accu-Chek Guide test strip Generic drug: glucose blood Use as instructed to monitor glucose twice daily.   Accu-Chek Softclix Lancets lancets Use as instructed to monitor glucose twice daily   blood glucose meter kit and supplies Dispense based on patient and insurance preference. Use up to four times daily as directed. (FOR ICD-10 E10.9, E11.9).   blood glucose meter kit and supplies Kit Dispense based on patient and insurance preference. Use up to four times daily as directed. (FOR ICD-9 250.00, 250.01).   buPROPion 150 MG 24 hr tablet Commonly known as: WELLBUTRIN XL Take 150 mg by mouth every morning.   CeraVe Crea APPLY 1 APPLICATION ONCE A DAY TO BOTH FEET   cycloSPORINE 0.05 % ophthalmic emulsion Commonly known as: RESTASIS 1 drop 2 (two) times daily.   dapagliflozin propanediol 5 MG Tabs tablet Commonly known as: Farxiga Take 1 tablet (5 mg total) by mouth daily.   divalproex 250 MG 24 hr tablet Commonly known as: DEPAKOTE ER Take 250-1,000 mg by mouth See admin instructions. 250mg  in the morning and 1000mg   at bedtime   Global Ease Inject Pen Needles 31G X 5 MM  Misc Generic drug: Insulin Pen Needle SMARTSIG:Syringe(s) SUB-Q   TRUEplus Pen Needles 31G X 8 MM Misc Generic drug: Insulin Pen Needle Use to inject insulin once daily   glucose monitoring kit monitoring kit 1 each by Does not apply route as needed for other.   ibuprofen 200 MG tablet Commonly known as: Motrin IB Take 2 tablets (400 mg total) by mouth daily.   lisinopril 10 MG tablet Commonly known as: ZESTRIL Take 1 tablet (10 mg total) by mouth daily.   Lokelma 5 g packet Generic drug: sodium zirconium  cyclosilicate Take 5 g by mouth.   loperamide 2 MG tablet Commonly known as: Imodium A-D Take 1 tablet (2 mg total) by mouth 4 (four) times daily as needed for diarrhea or loose stools.   omeprazole 40 MG capsule Commonly known as: PRILOSEC Take 1 capsule (40 mg total) by mouth daily.   Ozempic (1 MG/DOSE) 4 MG/3ML Sopn Generic drug: Semaglutide (1 MG/DOSE) Inject 1 mg into the skin once a week.   Pain Relief Extra Strength 500 MG tablet Generic drug: acetaminophen Take 1 tablet (500 mg total) by mouth every 6 (six) hours as needed.   PARoxetine 30 MG tablet Commonly known as: PAXIL Take 30 mg by mouth daily.   QUEtiapine 50 MG Tb24 24 hr tablet Commonly known as: SEROQUEL XR Take 50 mg by mouth daily.   simvastatin 40 MG tablet Commonly known as: ZOCOR One tablet daily   tamsulosin 0.4 MG Caps capsule Commonly known as: FLOMAX Take 1 capsule (0.4 mg total) by mouth daily.   Tegaderm Film 6"x8" Misc Use to cover free style libre sensor   tolnaftate 1 % spray Commonly known as: TINACTIN Apply topically 2 (two) times daily.   Athletes Foot Spray 1 % Aero Generic drug: Tolnaftate APPLY TOPICALLY 2 TIMES A DAY.   Evaristo Bury FlexTouch 100 UNIT/ML FlexTouch Pen Generic drug: insulin degludec Inject 15 Units into the skin at bedtime.   triamcinolone ointment 0.5 % Commonly known as: KENALOG APPLY TOPICALLY TWICE DAILY.   Vascepa 1 g capsule Generic drug: icosapent Ethyl TAKE (2) CAPSULES BY MOUTH TWICE DAILY.   Vitamin D (Ergocalciferol) 1.25 MG (50000 UNIT) Caps capsule Commonly known as: DRISDOL TAKE 1 CAPSULE BY MOUTH ONCE A WEEK ON SATURDAYS               Durable Medical Equipment  (From admission, onward)           Start     Ordered   06/10/23 0000  For home use only DME Other see comment       Comments: Dx: HTN, I15.2  Large Adult Blood Pressure Cuff  Question:  Length of Need  Answer:  Lifetime   06/10/23 1422   06/10/23 0000  For home  use only DME Other see comment       Comments: Blood pressure cuff Diagnosis hypertension  Question:  Length of Need  Answer:  Lifetime   06/10/23 1425             Objective:   BP 105/66   Pulse 99   Ht 5\' 8"  (1.727 m)   Wt 189 lb (85.7 kg)   SpO2 98%   BMI 28.74 kg/m   Wt Readings from Last 3 Encounters:  06/10/23 189 lb (85.7 kg)  05/29/23 190 lb (86.2 kg)  05/12/23 190 lb (86.2 kg)    Physical Exam Vitals and nursing note reviewed.  Constitutional:      General: He  is not in acute distress.    Appearance: He is well-developed. He is not diaphoretic.  Eyes:     General: No scleral icterus.    Conjunctiva/sclera: Conjunctivae normal.  Neck:     Thyroid: No thyromegaly.  Cardiovascular:     Rate and Rhythm: Normal rate and regular rhythm.     Heart sounds: Normal heart sounds. No murmur heard. Pulmonary:     Effort: Pulmonary effort is normal. No respiratory distress.     Breath sounds: Normal breath sounds. No wheezing.  Musculoskeletal:        General: No swelling. Normal range of motion.     Cervical back: Neck supple.  Lymphadenopathy:     Cervical: No cervical adenopathy.  Skin:    General: Skin is warm and dry.     Findings: No rash.  Neurological:     Mental Status: He is alert and oriented to person, place, and time.     Coordination: Coordination normal.  Psychiatric:        Behavior: Behavior normal.       Assessment & Plan:   Problem List Items Addressed This Visit       Cardiovascular and Mediastinum   Hypertension associated with diabetes (HCC)   Relevant Orders   CMP14+EGFR   For home use only DME Other see comment   For home use only DME Other see comment     Endocrine   Controlled type 2 diabetes mellitus with stage 3 chronic kidney disease, with long-term current use of insulin (HCC) - Primary   Relevant Orders   CBC with Differential/Platelet   Microalbumin / creatinine urine ratio   CKD stage 3 due to type 2 diabetes  mellitus (HCC)   Relevant Orders   Lipid panel   Hyperlipidemia associated with type 2 diabetes mellitus (HCC)   Other Visit Diagnoses       Prostate cancer screening       Relevant Orders   PSA, total and free       A1c is 6.7, looks good today blood pressure was under control.  No changes. BP low, will order bp cuff.   Follow up plan: Return in about 3 months (around 09/07/2023), or if symptoms worsen or fail to improve, for diabetes and htn.  Counseling provided for all of the vaccine components Orders Placed This Encounter  Procedures   For home use only DME Other see comment   For home use only DME Other see comment   CBC with Differential/Platelet   CMP14+EGFR   Lipid panel   PSA, total and free   Microalbumin / creatinine urine ratio    Arville Care, MD [place] [facility] 06/10/2023, 2:25 PM

## 2023-06-11 LAB — MICROALBUMIN / CREATININE URINE RATIO
Creatinine, Urine: 129.8 mg/dL
Microalb/Creat Ratio: 503 mg/g{creat} — ABNORMAL HIGH (ref 0–29)
Microalbumin, Urine: 652.7 ug/mL

## 2023-06-18 ENCOUNTER — Other Ambulatory Visit: Payer: Self-pay

## 2023-06-18 DIAGNOSIS — Z122 Encounter for screening for malignant neoplasm of respiratory organs: Secondary | ICD-10-CM

## 2023-06-18 DIAGNOSIS — Z87891 Personal history of nicotine dependence: Secondary | ICD-10-CM

## 2023-06-18 DIAGNOSIS — F17210 Nicotine dependence, cigarettes, uncomplicated: Secondary | ICD-10-CM

## 2023-06-28 IMAGING — US US RENAL
1 series · 14 of 25 positions shown · non-contrast
Comparison: 12/25/2015 renal ultrasound

CLINICAL DATA: Hypertension, diabetes, chronic kidney disease.

EXAM:
RENAL / URINARY TRACT ULTRASOUND COMPLETE

[Series 1: us renal · 14 of 59 slices shown]
[im 1/59]
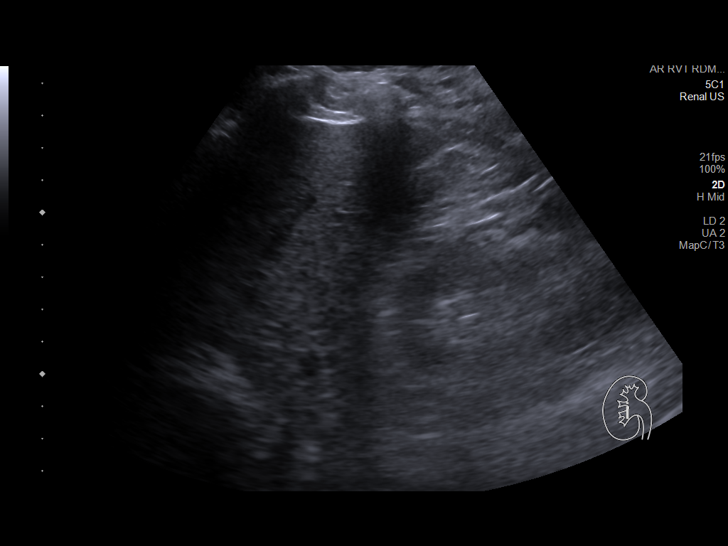
[im 5/59]
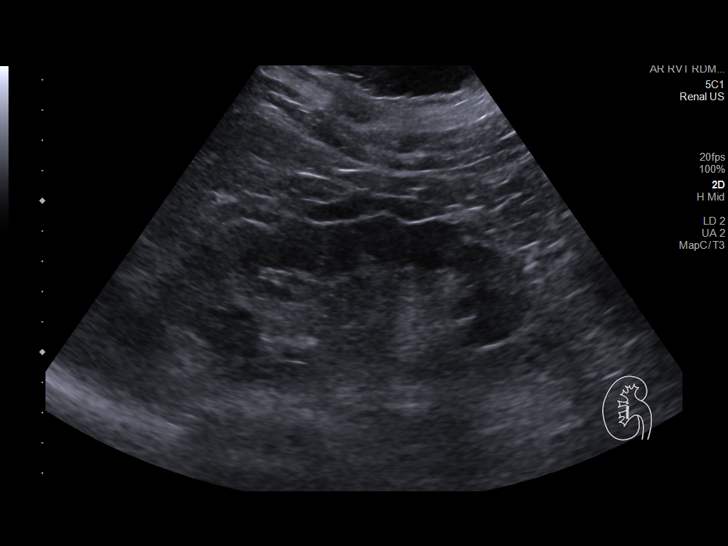
[im 10/59]
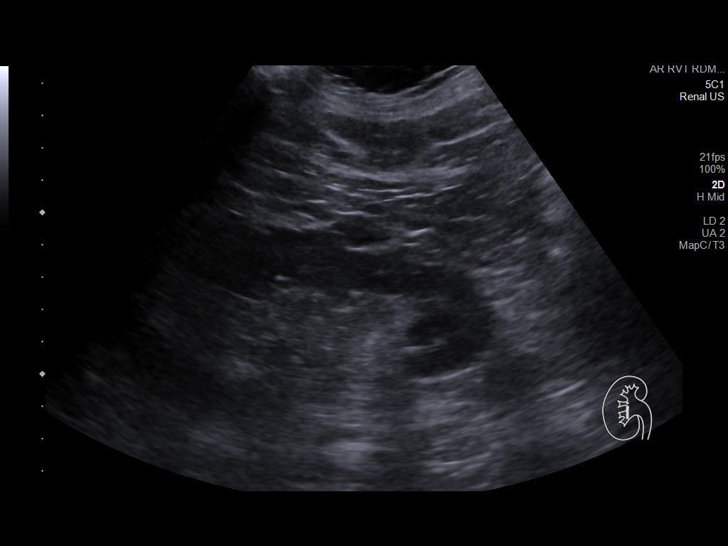
[im 15/59]
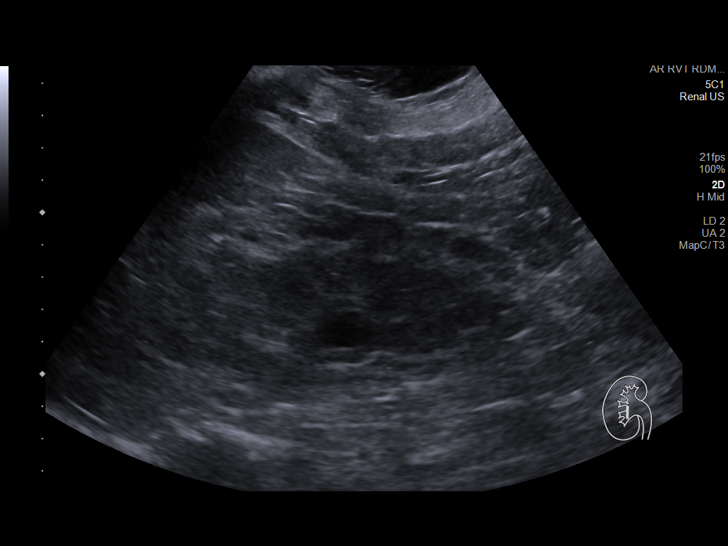
[im 20/59]
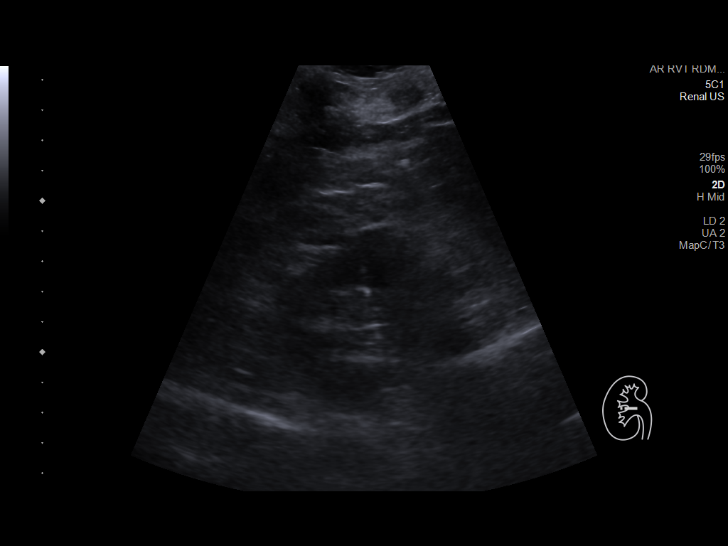
[im 22/59]
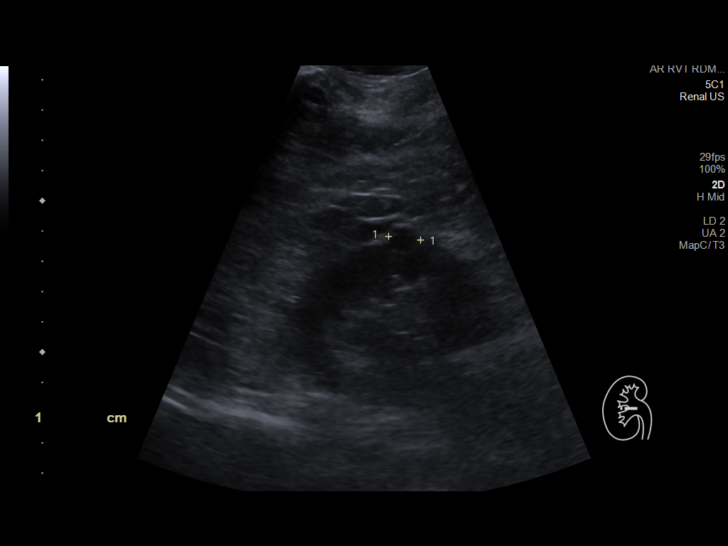
[im 27/59]
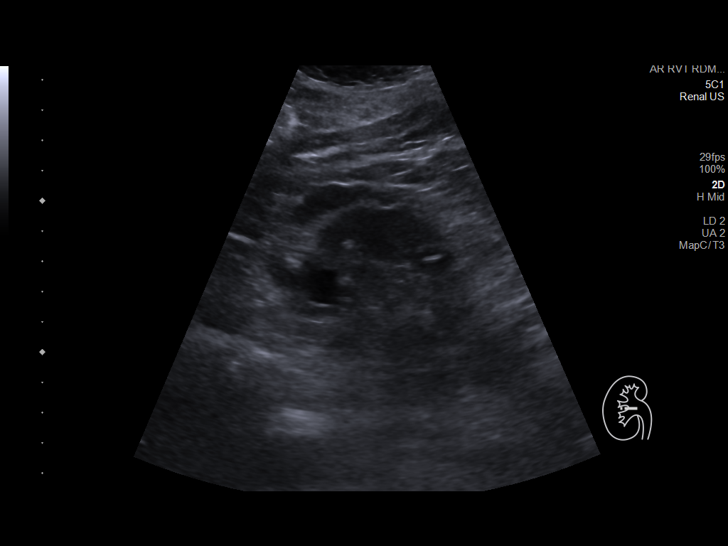
[im 32/59]
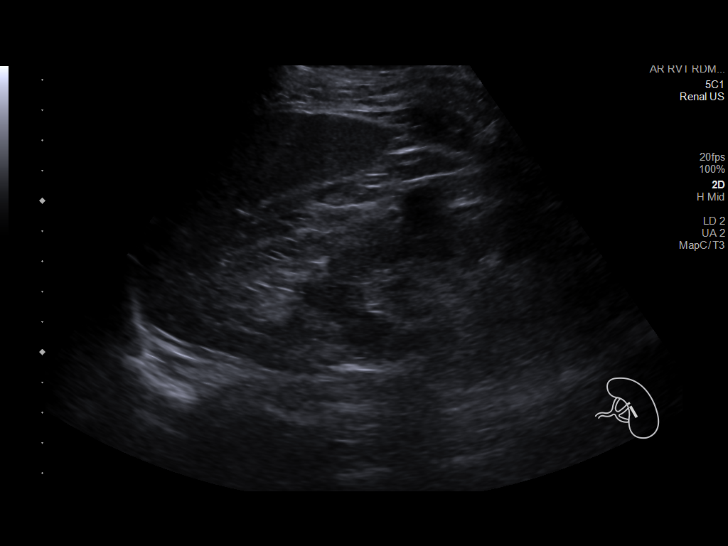
[im 37/59]
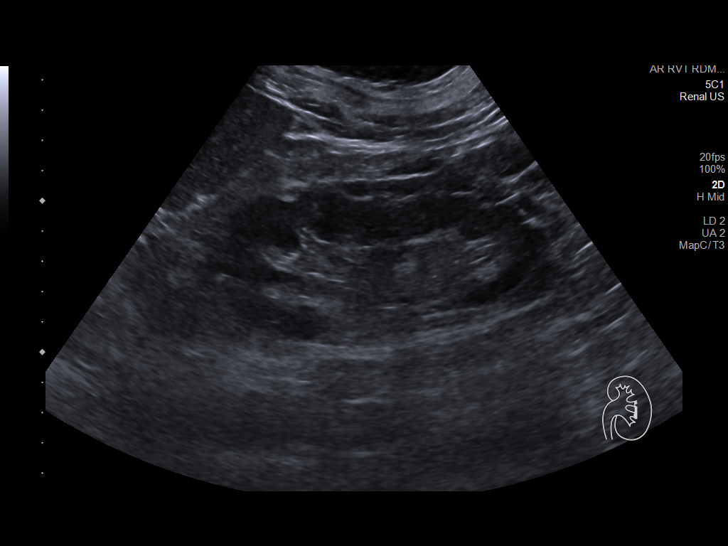
[im 39/59]
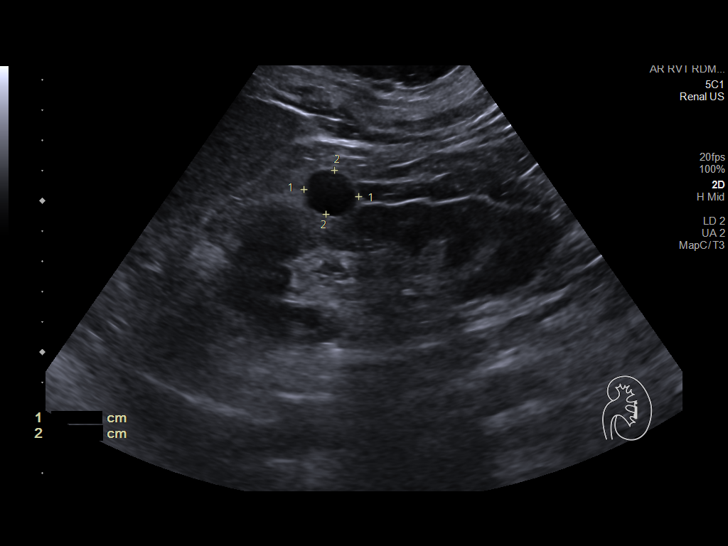
[im 44/59]
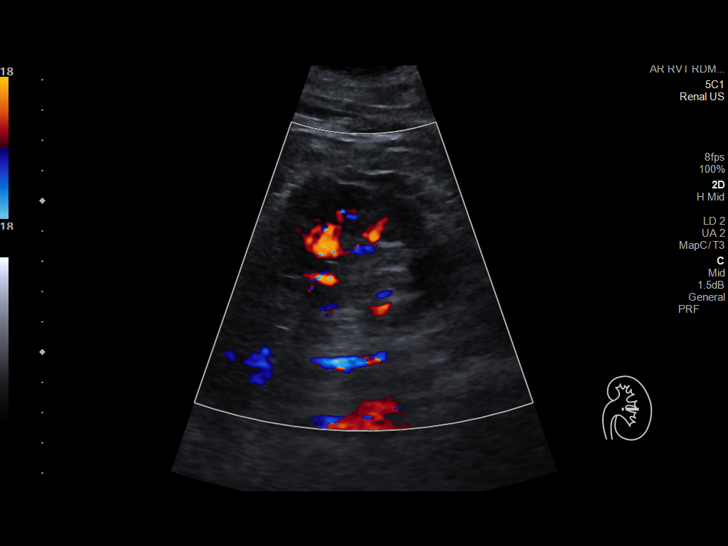
[im 49/59]
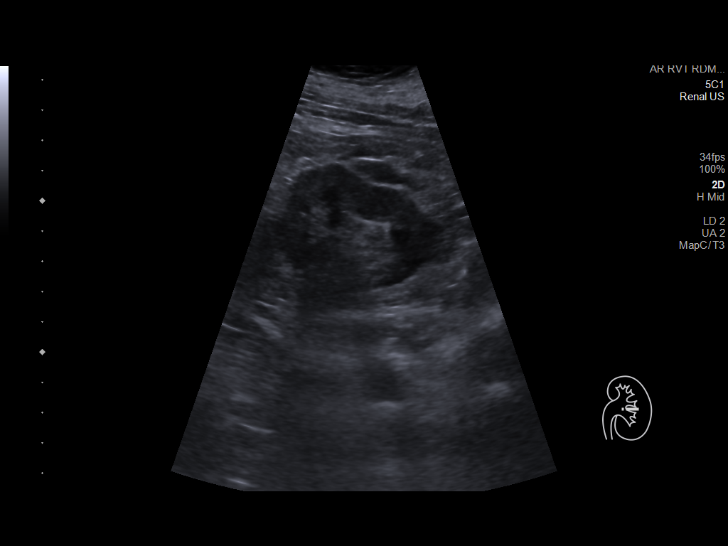
[im 54/59]
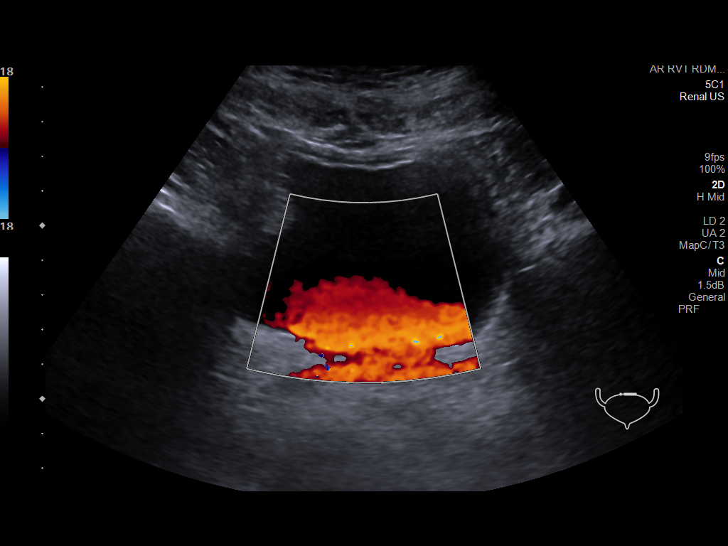
[im 59/59]
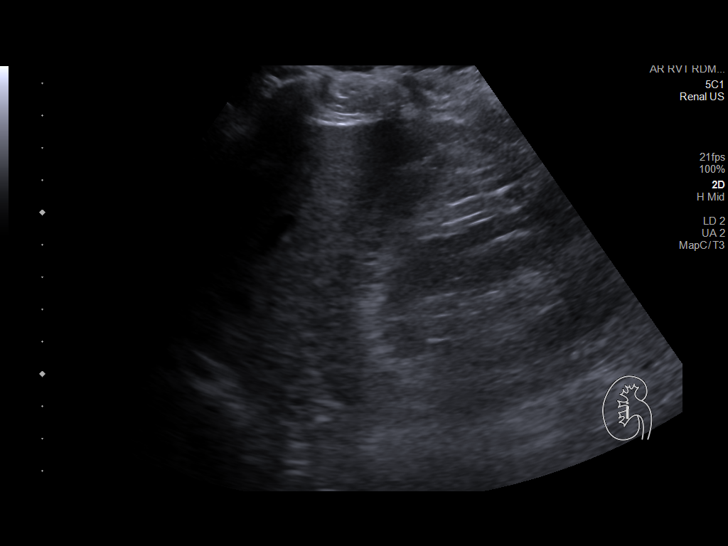

[14 of 25 positions shown; findings below may reference images not displayed]

FINDINGS: Right Kidney:

Renal measurements: 12.0 x 6.4 x 5.4 cm = volume: 217 mL.
Echogenicity within normal limits. There is no hydronephrosis. In
the mid right kidney there is an 8 x 9 by 11 mm hypoechoic
structure. In the also in the mid right kidney there is a 1.8 x
x 1.2 cm hypoechoic structure. These are favored as small cysts.

Left Kidney:

Renal measurements: 11.0 x 5.4 x 5.1 cm. = volume: 158 mL.
Echogenicity within normal limits. There is no hydronephrosis. In
the mid left kidney there is a 1.8 x 1.5 x 1.7 cm anechoic cyst.
Visualized.

Bladder:

Appears normal for degree of bladder distention. Bilateral ureteral
jets are visualized.

Other:

None.
IMPRESSION: 1. No hydronephrosis.
2. Bilateral renal cysts.

## 2023-07-09 DIAGNOSIS — B351 Tinea unguium: Secondary | ICD-10-CM | POA: Diagnosis not present

## 2023-07-09 DIAGNOSIS — L84 Corns and callosities: Secondary | ICD-10-CM | POA: Diagnosis not present

## 2023-07-09 DIAGNOSIS — M79676 Pain in unspecified toe(s): Secondary | ICD-10-CM | POA: Diagnosis not present

## 2023-07-23 DIAGNOSIS — N189 Chronic kidney disease, unspecified: Secondary | ICD-10-CM | POA: Diagnosis not present

## 2023-07-23 DIAGNOSIS — E211 Secondary hyperparathyroidism, not elsewhere classified: Secondary | ICD-10-CM | POA: Diagnosis not present

## 2023-07-23 DIAGNOSIS — D631 Anemia in chronic kidney disease: Secondary | ICD-10-CM | POA: Diagnosis not present

## 2023-07-23 DIAGNOSIS — R809 Proteinuria, unspecified: Secondary | ICD-10-CM | POA: Diagnosis not present

## 2023-07-31 DIAGNOSIS — N1832 Chronic kidney disease, stage 3b: Secondary | ICD-10-CM | POA: Diagnosis not present

## 2023-07-31 DIAGNOSIS — E875 Hyperkalemia: Secondary | ICD-10-CM | POA: Diagnosis not present

## 2023-07-31 DIAGNOSIS — D638 Anemia in other chronic diseases classified elsewhere: Secondary | ICD-10-CM | POA: Diagnosis not present

## 2023-07-31 DIAGNOSIS — E8722 Chronic metabolic acidosis: Secondary | ICD-10-CM | POA: Diagnosis not present

## 2023-08-18 ENCOUNTER — Ambulatory Visit
Admission: EM | Admit: 2023-08-18 | Discharge: 2023-08-18 | Disposition: A | Discharge status: Home / Self Care | Attending: Nurse Practitioner | Admitting: Nurse Practitioner

## 2023-08-18 ENCOUNTER — Encounter: Payer: Self-pay | Admitting: Emergency Medicine

## 2023-08-18 DIAGNOSIS — S0990XA Unspecified injury of head, initial encounter: Secondary | ICD-10-CM

## 2023-08-18 DIAGNOSIS — S0003XA Contusion of scalp, initial encounter: Secondary | ICD-10-CM | POA: Diagnosis not present

## 2023-08-18 MED ORDER — ACETAMINOPHEN 325 MG PO TABS
650.0000 mg | ORAL_TABLET | Freq: Once | ORAL | Status: AC
  Administered 2023-08-18: 650 mg via ORAL

## 2023-08-18 NOTE — Discharge Instructions (Signed)
 David Zamora's exam today looks great.  We gave Tylenol  today to help with the pain in his head.  You can continue Tylenol  every 6 hours at home as needed for head pain.  You can also apply ice to his head 15 minutes on, 45 minutes off every hour while awake.  If he develops changes in his mental status, vomiting, emotional changes, agitation, or changes in ambulation, please have him evaluated emergently.

## 2023-08-18 NOTE — ED Triage Notes (Signed)
 Fell against a wall today and hit head.

## 2023-08-18 NOTE — ED Provider Notes (Signed)
 [facility]    CSN: [id number] Arrival date & time: 08/18/23  1512      History   Chief Complaint No chief complaint on file.   HPI David Zamora is a 63 y.o. male.   Patient presents today with caregiver from group home for head injury that occurred earlier today, a few hours ago.  Patient describes history and reports he went to sit down in a chair and forgot that he had moved the chair and the chair was no longer there, so when he went to sit down, he fell into the wall, hitting his head against the wall.  No loss of consciousness.  He now has a tender area of the back/top of his head.  Caregiver has been applying ice to the area which has helped with the pain a little bit.  Patient endorses a headache.  Patient and caregiver deny confusion, memory changes, blank stare/stunned appearance, inattantiveness, slow/incoherent speech, dizziness, gait changes, vomiting, and emotional changes since he fell into the wall.  Patient does not take blood thinners.     Past Medical History:  Diagnosis Date   Depression    Diabetes mellitus    Diabetes mellitus without complication (HCC)    GERD (gastroesophageal reflux disease)    Hypertension    Mental retardation    Renal disorder     Patient Active Problem List   Diagnosis Date Noted   Hyperlipidemia associated with type 2 diabetes mellitus (HCC) 12/10/2020   Vertigo 08/15/2020   Anemia of chronic renal failure 12/04/2018   CKD stage 3 due to type 2 diabetes mellitus (HCC) 05/26/2018   Intellectual disability 12/24/2015   Hypertension associated with diabetes (HCC) 12/24/2015   Depressive disorder 12/24/2015   Gastroesophageal reflux disease 12/24/2015   Controlled type 2 diabetes mellitus with stage 3 chronic kidney disease, with long-term current use of insulin  (HCC) 12/24/2015    Past Surgical History:  Procedure Laterality Date   CYST REMOVAL TRUNK     FINGER FRACTURE SURGERY  02/22/2013       Home  Medications    Prior to Admission medications   Medication Sig Start Date End Date Taking? Authorizing Provider  Accu-Chek Softclix Lancets lancets Use as instructed to monitor glucose twice daily 01/30/23   Wendel Hals, NP  ATHLETES FOOT SPRAY 1 % AERO APPLY TOPICALLY 2 TIMES A DAY. 05/08/20   Dettinger, Lucio Sabin, MD  blood glucose meter kit and supplies KIT Dispense based on patient and insurance preference. Use up to four times daily as directed. (FOR ICD-9 250.00, 250.01). 06/18/16   Dettinger, Lucio Sabin, MD  blood glucose meter kit and supplies Dispense based on patient and insurance preference. Use up to four times daily as directed. (FOR ICD-10 E10.9, E11.9). 12/10/20   Galvin Jules, FNP  buPROPion  (WELLBUTRIN  XL) 150 MG 24 hr tablet Take 150 mg by mouth every morning.    [provider]  cycloSPORINE (RESTASIS) 0.05 % ophthalmic emulsion 1 drop 2 (two) times daily.    [provider]  dapagliflozin  propanediol (FARXIGA ) 5 MG TABS tablet Take 1 tablet (5 mg total) by mouth daily. 02/16/23   Dettinger, Lucio Sabin, MD  divalproex  (DEPAKOTE  ER) 250 MG 24 hr tablet Take 250-1,000 mg by mouth See admin instructions. 250mg  in the morning and 1000mg   at bedtime    [provider]  Emollient (CERAVE) CREA APPLY 1 APPLICATION ONCE A DAY TO BOTH FEET 08/30/20   Dettinger, Lucio Sabin, MD  GLOBAL EASE INJECT PEN NEEDLES 31G X 5 MM MISC SMARTSIG:Syringe(s) SUB-Q 12/31/20   [provider]  glucose blood (ACCU-CHEK GUIDE) test strip Use as instructed to monitor glucose twice daily. 01/30/23   Wendel Hals, NP  glucose monitoring kit (FREESTYLE) monitoring kit 1 each by Does not apply route as needed for other. 02/13/21   Dettinger, Lucio Sabin, MD  ibuprofen  (MOTRIN  IB) 200 MG tablet Take 2 tablets (400 mg total) by mouth daily. 09/22/19   Dettinger, Lucio Sabin, MD  insulin  degludec (TRESIBA  FLEXTOUCH) 100 UNIT/ML FlexTouch Pen Inject 15 Units into the skin at bedtime.  05/12/23   Wendel Hals, NP  Insulin  Pen Needle (TRUEPLUS PEN NEEDLES) 31G X 8 MM MISC Use to inject insulin  once daily 01/30/23   Wendel Hals, NP  lisinopril  (ZESTRIL ) 10 MG tablet Take 1 tablet (10 mg total) by mouth daily. 10/08/22   Dettinger, Lucio Sabin, MD  loperamide  (IMODIUM  A-D) 2 MG tablet Take 1 tablet (2 mg total) by mouth 4 (four) times daily as needed for diarrhea or loose stools. 05/07/20   Dettinger, Lucio Sabin, MD  omeprazole  (PRILOSEC) 40 MG capsule Take 1 capsule (40 mg total) by mouth daily. 03/09/23   Dettinger, Lucio Sabin, MD  PAIN RELIEF  EXTRA STRENGTH 500 MG tablet Take 1 tablet (500 mg total) by mouth every 6 (six) hours as needed. 01/24/22   Dettinger, Lucio Sabin, MD  PARoxetine  (PAXIL ) 30 MG tablet Take 30 mg by mouth daily. 01/15/21   [provider]  QUEtiapine  (SEROQUEL  XR) 50 MG TB24 24 hr tablet Take 50 mg by mouth daily. 11/13/20   [provider]  Semaglutide , 1 MG/DOSE, (OZEMPIC , 1 MG/DOSE,) 4 MG/3ML SOPN Inject 1 mg into the skin once a week. 03/09/23   Dettinger, Lucio Sabin, MD  simvastatin  (ZOCOR ) 40 MG tablet One tablet daily 02/20/23   Dettinger, Joshua A, MD  sodium zirconium cyclosilicate (LOKELMA) 5 g packet Take 5 g by mouth.    [provider]  tamsulosin  (FLOMAX ) 0.4 MG CAPS capsule Take 1 capsule (0.4 mg total) by mouth daily. 03/12/22   Dettinger, Lucio Sabin, MD  tolnaftate  (TINACTIN) 1 % spray Apply topically 2 (two) times daily. 11/29/18   Dettinger, Lucio Sabin, MD  Transparent Dressings (TEGADERM FILM [phone] MISC Use to cover free style libre sensor 09/28/19   Dettinger, Lucio Sabin, MD  triamcinolone ointment (KENALOG) 0.5 % APPLY TOPICALLY TWICE DAILY. 08/29/19   Dettinger, Lucio Sabin, MD  VASCEPA  1 g capsule TAKE (2) CAPSULES BY MOUTH TWICE DAILY. 05/20/23   Dettinger, Lucio Sabin, MD  Vitamin D , Ergocalciferol , (DRISDOL ) 1.25 MG (50000 UNIT) CAPS capsule TAKE 1 CAPSULE BY MOUTH ONCE A WEEK ON SATURDAYS 08/01/21   Dettinger, Lucio Sabin, MD     Family History Family History  Problem Relation Age of Onset   COPD Mother    Diabetes Mother    Heart disease Mother    Early death Father    Heart attack Father    Heart disease Father     Social History Social History   Tobacco Use   Smoking status: Every Day    Current packs/day: 0.75    Average packs/day: 0.8 packs/day for 46.3 years (34.8 ttl pk-yrs)    Types: Cigarettes    Start date: 1979   Smokeless tobacco: Never  Vaping Use   Vaping status: Never Used  Substance Use Topics   Alcohol  use: Not Currently    Comment: Denies   Drug use:  No    Comment: Denies      Allergies   Soap   Review of Systems Review of Systems Per HPI  Physical Exam Triage Vital Signs ED Triage Vitals  Encounter Vitals Group     BP 08/18/23 1515 139/81     Systolic BP Percentile --      Diastolic BP Percentile --      Pulse Rate 08/18/23 1515 93     Resp 08/18/23 1515 20     Temp 08/18/23 1515 97.7 F (36.5 C)     Temp Source 08/18/23 1515 Oral     SpO2 08/18/23 1515 98 %     Weight --      Height --      Head Circumference --      Peak Flow --      Pain Score 08/18/23 1533 8     Pain Loc --      Pain Education --      Exclude from Growth Chart --    No data found.  Updated Vital Signs BP 139/81 (BP Location: Right Arm)   Pulse 93   Temp 97.7 F (36.5 C) (Oral)   Resp 20   SpO2 98%   Visual Acuity Right Eye Distance:   Left Eye Distance:   Bilateral Distance:    Right Eye Near:   Left Eye Near:    Bilateral Near:     Physical Exam Vitals and nursing note reviewed.  Constitutional:      General: He is not in acute distress.    Appearance: Normal appearance. He is not ill-appearing, toxic-appearing or diaphoretic.  HENT:     Head: Normocephalic. Contusion present. No raccoon eyes, Battle's sign, right periorbital erythema or left periorbital erythema.      Comments: Slightly erythematous area of tenderness to posterior scalp in approximately area  marked    Right Ear: Tympanic membrane, ear canal and external ear normal. There is no impacted cerumen. No hemotympanum. Tympanic membrane is not perforated.     Left Ear: Tympanic membrane, ear canal and external ear normal. There is no impacted cerumen. No hemotympanum. Tympanic membrane is not perforated.     Nose: Nose normal. No congestion or rhinorrhea.     Mouth/Throat:     Mouth: Mucous membranes are moist.     Pharynx: Oropharynx is clear. No posterior oropharyngeal erythema.  Eyes:     General: No scleral icterus.    Extraocular Movements: Extraocular movements intact.     Pupils: Pupils are equal, round, and reactive to light.  Cardiovascular:     Rate and Rhythm: Normal rate and regular rhythm.  Pulmonary:     Effort: Pulmonary effort is normal. No respiratory distress.     Breath sounds: Normal breath sounds. No wheezing, rhonchi or rales.  Musculoskeletal:     Cervical back: Normal range of motion and neck supple. No rigidity or tenderness.  Lymphadenopathy:     Cervical: No cervical adenopathy.  Skin:    General: Skin is warm and dry.     Capillary Refill: Capillary refill takes less than 2 seconds.     Coloration: Skin is not jaundiced or pale.     Findings: No erythema.  Neurological:     General: No focal deficit present.     Mental Status: He is alert and oriented to person, place, and time.     GCS: GCS eye subscore is 4. GCS verbal subscore is 5. GCS motor subscore is 6.  Cranial Nerves: Cranial nerves 2-12 are intact.     Sensory: Sensation is intact.     Motor: Motor function is intact. No weakness.     Coordination: Coordination is intact. Coordination normal.     Gait: Gait is intact. Gait normal.  Psychiatric:        Behavior: Behavior is cooperative.      UC Treatments / Results  Labs (all labs ordered are listed, but only abnormal results are displayed) Labs Reviewed - No data to display  EKG   Radiology No results  found.  Procedures Procedures (including critical care time)  Medications Ordered in UC Medications  acetaminophen  (TYLENOL ) tablet 650 mg (650 mg Oral Given 08/18/23 1533)    Initial Impression / Assessment and Plan / UC Course  I have reviewed the triage vital signs and the nursing notes.  Pertinent labs & imaging results that were available during my care of the patient were reviewed by me and considered in my medical decision making (see chart for details).   Patient is well-appearing, normotensive, afebrile, not tachycardic, not tachypneic, oxygenating well on room air.    1. Injury of head, initial encounter 2. Contusion of scalp, initial encounter No red flags in history or on exam today Mechanism of injury is low risk for acute intracranial abnormality and patient is not on anticoagulation Per [place] CT Head Rule, no CT imaging indicated at this time Discussed ice, Tylenol  as needed for pain  Strict ER precautions discussed with caregiver with any acute changes in condition  The patient and caregiver were given the opportunity to ask questions.  All questions answered to their satisfaction.  The patient and caregiver are in agreement to this plan.   Final Clinical Impressions(s) / UC Diagnoses   Final diagnoses:  Injury of head, initial encounter  Contusion of scalp, initial encounter     Discharge Instructions      Wilmore's exam today looks great.  We gave Tylenol  today to help with the pain in his head.  You can continue Tylenol  every 6 hours at home as needed for head pain.  You can also apply ice to his head 15 minutes on, 45 minutes off every hour while awake.  If he develops changes in his mental status, vomiting, emotional changes, agitation, or changes in ambulation, please have him evaluated emergently.      ED Prescriptions   None    PDMP not reviewed this encounter.   Wilhemena Harbour, NP 08/18/23 [phone]

## 2023-08-25 ENCOUNTER — Other Ambulatory Visit: Payer: Self-pay | Admitting: Family Medicine

## 2023-08-25 DIAGNOSIS — E785 Hyperlipidemia, unspecified: Secondary | ICD-10-CM

## 2023-08-30 ENCOUNTER — Other Ambulatory Visit: Payer: Self-pay | Admitting: Family Medicine

## 2023-08-31 ENCOUNTER — Other Ambulatory Visit: Payer: Self-pay | Admitting: Family Medicine

## 2023-09-09 ENCOUNTER — Ambulatory Visit: Payer: 59 | Admitting: Nurse Practitioner

## 2023-09-09 ENCOUNTER — Ambulatory Visit: Payer: 59 | Admitting: Family Medicine

## 2023-09-09 DIAGNOSIS — E782 Mixed hyperlipidemia: Secondary | ICD-10-CM

## 2023-09-09 DIAGNOSIS — Z794 Long term (current) use of insulin: Secondary | ICD-10-CM

## 2023-09-09 DIAGNOSIS — E1122 Type 2 diabetes mellitus with diabetic chronic kidney disease: Secondary | ICD-10-CM

## 2023-09-09 DIAGNOSIS — E1159 Type 2 diabetes mellitus with other circulatory complications: Secondary | ICD-10-CM

## 2023-09-10 ENCOUNTER — Encounter: Payer: Self-pay | Admitting: Family Medicine

## 2023-09-14 ENCOUNTER — Encounter: Payer: Self-pay | Admitting: Acute Care

## 2023-09-24 ENCOUNTER — Telehealth: Payer: Self-pay | Admitting: Family Medicine

## 2023-09-28 ENCOUNTER — Ambulatory Visit: Payer: Self-pay

## 2023-09-28 NOTE — Telephone Encounter (Addendum)
 FYI Only or Action Required?: FYI only for provider  Patient was last seen in primary care on 06/10/2023 by Dettinger, Lucio Sabin, MD. Called Nurse Triage reporting Toe Pain. Symptoms began unknown amount of time. Interventions attempted: Nothing. Symptoms are: unchanged.  Triage Disposition: See Physician Within 24 Hours  Patient/caregiver understands and will follow disposition?: Yes  Copied from CRM [id number]. Topic: Clinical - Red Word Triage >> Sep 28, 2023  4:33 PM Tiffany H wrote: Red Word that prompted transfer to Nurse Triage: David Zamora with [facility] called to request appointment for Dr. Steen Eden. Patient's podiatrist isn't available until July. Patient is insulin  dependent diabetic with blisters/sores on the bottom of his foot. Unsure of how long they've been there or whether or not they're healing. Patient needs an ASAP appointment.   Please assist. Reason for Disposition  [1] Swollen toe AND [2] no fever  (Exceptions: Just a localized bump from bunion, corns, insect bite, sting.)  Answer Assessment - Initial Assessment Questions 1. ONSET: When did the pain start?      Patient unable to say how long the pain has been present 2. LOCATION: Where is the pain located?   (e.g., around nail, entire toe, at foot joint)      Right big toe joint 3. PAIN: How bad is the pain?    (Scale 1-10; or mild, moderate, severe)   -  MILD (1-3): doesn't interfere with normal activities    -  MODERATE (4-7): interferes with normal activities (e.g., work or school) or awakens from sleep, limping    -  SEVERE (8-10): excruciating pain, unable to do any normal activities, unable to walk     moderate 4. APPEARANCE: What does the toe look like? (e.g., redness, swelling, bruising, pallor)     Redness, pallor 5. CAUSE: What do you think is causing the toe pain?     Blister/sore-caregiver unsure of how long its been there.  6. OTHER SYMPTOMS: Do you have any other symptoms? (e.g.,  leg pain, rash, fever, numbness)     no  Protocols used: Toe Pain-A-AH

## 2023-09-29 ENCOUNTER — Ambulatory Visit

## 2023-09-29 DIAGNOSIS — M79674 Pain in right toe(s): Secondary | ICD-10-CM | POA: Diagnosis not present

## 2023-09-29 DIAGNOSIS — M869 Osteomyelitis, unspecified: Secondary | ICD-10-CM | POA: Diagnosis not present

## 2023-09-29 DIAGNOSIS — L089 Local infection of the skin and subcutaneous tissue, unspecified: Secondary | ICD-10-CM | POA: Diagnosis not present

## 2023-09-29 DIAGNOSIS — S90462A Insect bite (nonvenomous), left great toe, initial encounter: Secondary | ICD-10-CM | POA: Diagnosis not present

## 2023-09-29 DIAGNOSIS — E119 Type 2 diabetes mellitus without complications: Secondary | ICD-10-CM | POA: Diagnosis not present

## 2023-09-29 DIAGNOSIS — W57XXXA Bitten or stung by nonvenomous insect and other nonvenomous arthropods, initial encounter: Secondary | ICD-10-CM | POA: Diagnosis not present

## 2023-09-29 NOTE — Telephone Encounter (Signed)
 Appt made

## 2023-10-13 DIAGNOSIS — H25811 Combined forms of age-related cataract, right eye: Secondary | ICD-10-CM | POA: Diagnosis not present

## 2023-10-13 DIAGNOSIS — H25812 Combined forms of age-related cataract, left eye: Secondary | ICD-10-CM | POA: Diagnosis not present

## 2023-10-14 ENCOUNTER — Telehealth: Payer: Self-pay | Admitting: Family Medicine

## 2023-10-14 NOTE — Telephone Encounter (Signed)
 Aware paperwork ready

## 2023-10-14 NOTE — Telephone Encounter (Signed)
 Leita (Manager/Caregiver from [employer]) dropped off [employer] forms to be completed and signed.  Form Fee Paid? (Y/N)     No       If NO, form is placed on front office manager desk to hold until payment received. If YES, then form will be placed in the RX/HH Nurse Coordinators box for completion.  Form will not be processed until payment is received   Please call Leita, when forms are ready to be picked-up!

## 2023-10-20 DIAGNOSIS — M79674 Pain in right toe(s): Secondary | ICD-10-CM | POA: Diagnosis not present

## 2023-10-20 DIAGNOSIS — L84 Corns and callosities: Secondary | ICD-10-CM | POA: Diagnosis not present

## 2023-10-20 DIAGNOSIS — B351 Tinea unguium: Secondary | ICD-10-CM | POA: Diagnosis not present

## 2023-10-20 DIAGNOSIS — E1142 Type 2 diabetes mellitus with diabetic polyneuropathy: Secondary | ICD-10-CM | POA: Diagnosis not present

## 2023-10-20 DIAGNOSIS — M79675 Pain in left toe(s): Secondary | ICD-10-CM | POA: Diagnosis not present

## 2023-10-29 ENCOUNTER — Encounter: Payer: Self-pay | Admitting: Family Medicine

## 2023-10-29 ENCOUNTER — Ambulatory Visit: Admitting: Family Medicine

## 2023-10-29 ENCOUNTER — Ambulatory Visit (INDEPENDENT_AMBULATORY_CARE_PROVIDER_SITE_OTHER): Admitting: *Deleted

## 2023-10-29 VITALS — BP 114/74 | HR 88 | Temp 97.9°F | Ht 68.0 in | Wt 191.0 lb

## 2023-10-29 DIAGNOSIS — E1165 Type 2 diabetes mellitus with hyperglycemia: Secondary | ICD-10-CM

## 2023-10-29 DIAGNOSIS — E1122 Type 2 diabetes mellitus with diabetic chronic kidney disease: Secondary | ICD-10-CM

## 2023-10-29 DIAGNOSIS — Z794 Long term (current) use of insulin: Secondary | ICD-10-CM

## 2023-10-29 DIAGNOSIS — E1169 Type 2 diabetes mellitus with other specified complication: Secondary | ICD-10-CM

## 2023-10-29 DIAGNOSIS — E1159 Type 2 diabetes mellitus with other circulatory complications: Secondary | ICD-10-CM | POA: Diagnosis not present

## 2023-10-29 DIAGNOSIS — E785 Hyperlipidemia, unspecified: Secondary | ICD-10-CM | POA: Diagnosis not present

## 2023-10-29 DIAGNOSIS — N183 Chronic kidney disease, stage 3 unspecified: Secondary | ICD-10-CM | POA: Diagnosis not present

## 2023-10-29 DIAGNOSIS — Z125 Encounter for screening for malignant neoplasm of prostate: Secondary | ICD-10-CM

## 2023-10-29 DIAGNOSIS — I152 Hypertension secondary to endocrine disorders: Secondary | ICD-10-CM

## 2023-10-29 LAB — LIPID PANEL

## 2023-10-29 LAB — BAYER DCA HB A1C WAIVED: HB A1C (BAYER DCA - WAIVED): 7.1 % — ABNORMAL HIGH (ref 4.8–5.6)

## 2023-10-29 LAB — HM DIABETES EYE EXAM

## 2023-10-29 MED ORDER — TAMSULOSIN HCL 0.4 MG PO CAPS: 0.4000 mg | ORAL_CAPSULE | Freq: Every day | ORAL | 3 refills | Status: AC

## 2023-10-29 MED ORDER — OZEMPIC (1 MG/DOSE) 4 MG/3ML ~~LOC~~ SOPN: 1.0000 mg | PEN_INJECTOR | SUBCUTANEOUS | 3 refills | Status: DC

## 2023-10-29 MED ORDER — OMEGA-3-ACID ETHYL ESTERS 1 G PO CAPS: 2.0000 g | ORAL_CAPSULE | Freq: Two times a day (BID) | ORAL | 3 refills | Status: AC

## 2023-10-29 MED ORDER — DAPAGLIFLOZIN PROPANEDIOL 5 MG PO TABS: 5.0000 mg | ORAL_TABLET | Freq: Every day | ORAL | 3 refills | Status: AC

## 2023-10-29 MED ORDER — LISINOPRIL 10 MG PO TABS: 10.0000 mg | ORAL_TABLET | Freq: Every day | ORAL | 3 refills | Status: DC

## 2023-10-29 NOTE — Progress Notes (Signed)
 Arrived on 10/29/2023 and has given verbal consent to obtain images and complete their overdue diabetic retinal screening.  The images have been sent to an ophthalmologist or optometrist for review and interpretation.  Results will be sent back to [facility] for review.  Patient has been informed they will be contacted when we receive the results via telephone or MyChart.  Patient is scheduled to have cataract surgery on his LT eye on 11/02/2023 and on his RT eye in August 2025.

## 2023-10-29 NOTE — Progress Notes (Signed)
 BP 114/74   Pulse 88   Temp 97.9 F (36.6 C)   Ht 5' 8 (1.727 m)   Wt 191 lb (86.6 kg)   SpO2 90%   BMI 29.04 kg/m    Subjective:   Patient ID: David Zamora, male    DOB: [birth year], 63 y.o.   MRN: [id number]  HPI: David Zamora is a 63 y.o. male presenting on 10/29/2023 for Medical Management of Chronic Issues, Diabetes, Hyperlipidemia, and Hypertension   HPI Type 2 diabetes mellitus Patient comes in today for recheck of his diabetes. Patient has been currently taking Farxiga  and Ozempic . Patient is currently on an ACE inhibitor/ARB. Patient has not seen an ophthalmologist this year. Patient denies any new issues with their feet. The symptom started onset as an adult hypertension and hyperlipidemia ARE RELATED TO DM   Hypertension Patient is currently on lisinopril , and their blood pressure today is 114/74. Patient denies any lightheadedness or dizziness. Patient denies headaches, blurred vision, chest pains, shortness of breath, or weakness. Denies any side effects from medication and is content with current medication.   Hyperlipidemia Patient is coming in for recheck of his hyperlipidemia. The patient is currently taking simvastatin  and fish oil. They deny any issues with myalgias or history of liver damage from it. They deny any focal numbness or weakness or chest pain.   Relevant past medical, surgical, family and social history reviewed and updated as indicated. Interim medical history since our last visit reviewed. Allergies and medications reviewed and updated.  Review of Systems  Constitutional:  Negative for chills and fever.  Eyes:  Negative for visual disturbance.  Respiratory:  Negative for shortness of breath and wheezing.   Cardiovascular:  Negative for chest pain and leg swelling.  Musculoskeletal:  Negative for back pain and gait problem.  Skin:  Negative for rash.  Neurological:  Negative for dizziness and light-headedness.  All other systems reviewed and  are negative.   Per HPI unless specifically indicated above   Allergies as of 10/29/2023       Reactions   Soap    White soaps        Medication List        Accurate as of October 29, 2023  1:48 PM. If you have any questions, ask your nurse or doctor.          STOP taking these medications    Vascepa  1 g capsule Generic drug: icosapent  Ethyl Replaced by: omega-3 acid ethyl esters 1 g capsule Stopped by: Fonda LABOR Darbi Chandran       TAKE these medications    Accu-Chek Guide test strip Generic drug: glucose blood Use as instructed to monitor glucose twice daily.   Accu-Chek Softclix Lancets lancets Use as instructed to monitor glucose twice daily   blood glucose meter kit and supplies Dispense based on patient and insurance preference. Use up to four times daily as directed. (FOR ICD-10 E10.9, E11.9).   blood glucose meter kit and supplies Kit Dispense based on patient and insurance preference. Use up to four times daily as directed. (FOR ICD-9 250.00, 250.01).   buPROPion  150 MG 24 hr tablet Commonly known as: WELLBUTRIN  XL Take 150 mg by mouth every morning.   CeraVe Crea APPLY 1 APPLICATION ONCE A DAY TO BOTH FEET   cycloSPORINE 0.05 % ophthalmic emulsion Commonly known as: RESTASIS 1 drop 2 (two) times daily.   dapagliflozin  propanediol 5 MG Tabs tablet Commonly known as: Farxiga  Take 1 tablet (5  mg total) by mouth daily.   divalproex  250 MG 24 hr tablet Commonly known as: DEPAKOTE  ER Take 250-1,000 mg by mouth See admin instructions. 250mg  in the morning and 1000mg   at bedtime   Global Ease Inject Pen Needles 31G X 5 MM Misc Generic drug: Insulin  Pen Needle SMARTSIG:Syringe(s) SUB-Q   TRUEplus Pen Needles 31G X 8 MM Misc Generic drug: Insulin  Pen Needle Use to inject insulin  once daily   glucose monitoring kit monitoring kit 1 each by Does not apply route as needed for other.   ibuprofen  200 MG tablet Commonly known as: Motrin  IB Take 2  tablets (400 mg total) by mouth daily.   lisinopril  10 MG tablet Commonly known as: ZESTRIL  Take 1 tablet (10 mg total) by mouth daily.   Lokelma 5 g packet Generic drug: sodium zirconium cyclosilicate Take 5 g by mouth.   loperamide  2 MG tablet Commonly known as: Imodium  A-D Take 1 tablet (2 mg total) by mouth 4 (four) times daily as needed for diarrhea or loose stools.   omega-3 acid ethyl esters 1 g capsule Commonly known as: LOVAZA  Take 2 capsules (2 g total) by mouth 2 (two) times daily. Replaces: Vascepa  1 g capsule Started by: Fonda LABOR David Zamora   omeprazole  40 MG capsule Commonly known as: PRILOSEC Take 1 capsule (40 mg total) by mouth daily.   Ozempic  (1 MG/DOSE) 4 MG/3ML Sopn Generic drug: Semaglutide  (1 MG/DOSE) Inject 1 mg into the skin once a week.   Pain Relief  Extra Strength 500 MG tablet Generic drug: acetaminophen  Take 1 tablet (500 mg total) by mouth every 6 (six) hours as needed.   PARoxetine  30 MG tablet Commonly known as: PAXIL  Take 30 mg by mouth daily.   QUEtiapine  50 MG Tb24 24 hr tablet Commonly known as: SEROQUEL  XR Take 50 mg by mouth daily.   simvastatin  40 MG tablet Commonly known as: ZOCOR  One tablet daily   tamsulosin  0.4 MG Caps capsule Commonly known as: FLOMAX  Take 1 capsule (0.4 mg total) by mouth daily.   Tegaderm Film 6x8 Misc Use to cover free style libre sensor   tolnaftate  1 % spray Commonly known as: TINACTIN Apply topically 2 (two) times daily.   Athletes Foot Spray 1 % Aero Generic drug: Tolnaftate  APPLY TOPICALLY 2 TIMES A DAY.   Tresiba  FlexTouch 100 UNIT/ML FlexTouch Pen Generic drug: insulin  degludec Inject 15 Units into the skin at bedtime.   triamcinolone ointment 0.5 % Commonly known as: KENALOG APPLY TOPICALLY TWICE DAILY.   Vitamin D  (Ergocalciferol ) 1.25 MG (50000 UNIT) Caps capsule Commonly known as: DRISDOL  TAKE 1 CAPSULE BY MOUTH ONCE A WEEK ON SATURDAYS         Objective:   BP  114/74   Pulse 88   Temp 97.9 F (36.6 C)   Ht 5' 8 (1.727 m)   Wt 191 lb (86.6 kg)   SpO2 90%   BMI 29.04 kg/m   Wt Readings from Last 3 Encounters:  10/29/23 191 lb (86.6 kg)  06/10/23 189 lb (85.7 kg)  05/29/23 190 lb (86.2 kg)    Physical Exam Vitals and nursing note reviewed.  Constitutional:      General: He is not in acute distress.    Appearance: He is well-developed. He is not diaphoretic.  Eyes:     General: No scleral icterus.    Conjunctiva/sclera: Conjunctivae normal.  Neck:     Thyroid : No thyromegaly.  Cardiovascular:     Rate and Rhythm: Normal rate  and regular rhythm.     Heart sounds: Normal heart sounds. No murmur heard. Pulmonary:     Effort: Pulmonary effort is normal. No respiratory distress.     Breath sounds: Normal breath sounds. No wheezing.  Musculoskeletal:        General: No swelling. Normal range of motion.     Cervical back: Neck supple.  Lymphadenopathy:     Cervical: No cervical adenopathy.  Skin:    General: Skin is warm and dry.     Findings: No rash.  Neurological:     Mental Status: He is alert and oriented to person, place, and time.     Coordination: Coordination normal.  Psychiatric:        Behavior: Behavior normal.       Assessment & Plan:   Problem List Items Addressed This Visit       Cardiovascular and Mediastinum   Hypertension associated with diabetes (HCC)   Relevant Medications   dapagliflozin  propanediol (FARXIGA ) 5 MG TABS tablet   lisinopril  (ZESTRIL ) 10 MG tablet   Semaglutide , 1 MG/DOSE, (OZEMPIC , 1 MG/DOSE,) 4 MG/3ML SOPN   omega-3 acid ethyl esters (LOVAZA ) 1 g capsule   Other Relevant Orders   Bayer DCA Hb A1c Waived   CBC with Differential/Platelet   CMP14+EGFR   Lipid panel     Endocrine   Controlled type 2 diabetes mellitus with stage 3 chronic kidney disease, with long-term current use of insulin  (HCC)   Relevant Medications   dapagliflozin  propanediol (FARXIGA ) 5 MG TABS tablet    lisinopril  (ZESTRIL ) 10 MG tablet   Semaglutide , 1 MG/DOSE, (OZEMPIC , 1 MG/DOSE,) 4 MG/3ML SOPN   CKD stage 3 due to type 2 diabetes mellitus (HCC) - Primary   Relevant Medications   dapagliflozin  propanediol (FARXIGA ) 5 MG TABS tablet   lisinopril  (ZESTRIL ) 10 MG tablet   Semaglutide , 1 MG/DOSE, (OZEMPIC , 1 MG/DOSE,) 4 MG/3ML SOPN   Other Relevant Orders   Bayer DCA Hb A1c Waived   CBC with Differential/Platelet   CMP14+EGFR   Lipid panel   Hyperlipidemia associated with type 2 diabetes mellitus (HCC)   Relevant Medications   dapagliflozin  propanediol (FARXIGA ) 5 MG TABS tablet   lisinopril  (ZESTRIL ) 10 MG tablet   Semaglutide , 1 MG/DOSE, (OZEMPIC , 1 MG/DOSE,) 4 MG/3ML SOPN   omega-3 acid ethyl esters (LOVAZA ) 1 g capsule   Other Relevant Orders   Bayer DCA Hb A1c Waived   CBC with Differential/Platelet   CMP14+EGFR   Lipid panel   Other Visit Diagnoses       Prostate cancer screening       Relevant Orders   PSA, total and free       A1c looks decent at 7.1, will not adjust any of his medicines.  He does see endocrinology as well.  He is going to do his diabetic eye exam today. Follow up plan: Return in about 3 months (around 01/29/2024), or if symptoms worsen or fail to improve, for Diabetes recheck.  Counseling provided for all of the vaccine components Orders Placed This Encounter  Procedures   Bayer DCA Hb A1c Waived   CBC with Differential/Platelet   CMP14+EGFR   Lipid panel   PSA, total and free    Fonda Levins, MD [name] 10/29/2023, 1:48 PM

## 2023-10-30 ENCOUNTER — Ambulatory Visit: Payer: Self-pay | Admitting: Family Medicine

## 2023-10-30 ENCOUNTER — Other Ambulatory Visit: Payer: Self-pay | Admitting: Family Medicine

## 2023-10-30 LAB — CBC WITH DIFFERENTIAL/PLATELET
Basophils Absolute: 0 x10E3/uL (ref 0.0–0.2)
Basos: 1 %
EOS (ABSOLUTE): 0.2 x10E3/uL (ref 0.0–0.4)
Eos: 4 %
Hematocrit: 39.8 % (ref 37.5–51.0)
Hemoglobin: 13.5 g/dL (ref 13.0–17.7)
Immature Grans (Abs): 0.1 x10E3/uL (ref 0.0–0.1)
Immature Granulocytes: 1 %
Lymphocytes Absolute: 2.3 x10E3/uL (ref 0.7–3.1)
Lymphs: 41 %
MCH: 33.8 pg — ABNORMAL HIGH (ref 26.6–33.0)
MCHC: 33.9 g/dL (ref 31.5–35.7)
MCV: 100 fL — ABNORMAL HIGH (ref 79–97)
Monocytes Absolute: 0.3 x10E3/uL (ref 0.1–0.9)
Monocytes: 6 %
Neutrophils Absolute: 2.7 x10E3/uL (ref 1.4–7.0)
Neutrophils: 47 %
Platelets: 214 x10E3/uL (ref 150–450)
RBC: 4 x10E6/uL — ABNORMAL LOW (ref 4.14–5.80)
RDW: 13.3 % (ref 11.6–15.4)
WBC: 5.7 x10E3/uL (ref 3.4–10.8)

## 2023-10-30 LAB — LIPID PANEL
Chol/HDL Ratio: 6.1 ratio — ABNORMAL HIGH (ref 0.0–5.0)
Cholesterol, Total: 226 mg/dL — ABNORMAL HIGH (ref 100–199)
HDL: 37 mg/dL — ABNORMAL LOW (ref >39)
LDL Chol Calc (NIH): 86 mg/dL (ref 0–99)
Triglycerides: 629 mg/dL (ref 0–149)
VLDL Cholesterol Cal: 103 mg/dL — ABNORMAL HIGH (ref 5–40)

## 2023-10-30 LAB — PSA, TOTAL AND FREE
PSA, Free Pct: 44.7 %
PSA, Free: 0.85 ng/mL
Prostate Specific Ag, Serum: 1.9 ng/mL (ref 0.0–4.0)

## 2023-10-30 LAB — CMP14+EGFR
ALT: 13 IU/L (ref 0–44)
AST: 18 IU/L (ref 0–40)
Albumin: 3.8 g/dL — ABNORMAL LOW (ref 3.9–4.9)
Alkaline Phosphatase: 72 IU/L (ref 44–121)
BUN/Creatinine Ratio: 13 (ref 10–24)
BUN: 28 mg/dL — ABNORMAL HIGH (ref 8–27)
Bilirubin Total: <0.2 mg/dL (ref 0.0–1.2)
CO2: 19 mmol/L — ABNORMAL LOW (ref 20–29)
Calcium: 8.7 mg/dL (ref 8.6–10.2)
Chloride: 101 mmol/L (ref 96–106)
Creatinine, Ser: 2.21 mg/dL — ABNORMAL HIGH (ref 0.76–1.27)
Globulin, Total: 2 g/dL (ref 1.5–4.5)
Glucose: 205 mg/dL — ABNORMAL HIGH (ref 70–99)
Potassium: 5 mmol/L (ref 3.5–5.2)
Sodium: 137 mmol/L (ref 134–144)
Total Protein: 5.8 g/dL — ABNORMAL LOW (ref 6.0–8.5)
eGFR: 32 mL/min/1.73 — ABNORMAL LOW (ref >59)

## 2023-11-02 DIAGNOSIS — J449 Chronic obstructive pulmonary disease, unspecified: Secondary | ICD-10-CM | POA: Diagnosis not present

## 2023-11-02 DIAGNOSIS — F17219 Nicotine dependence, cigarettes, with unspecified nicotine-induced disorders: Secondary | ICD-10-CM | POA: Diagnosis not present

## 2023-11-02 DIAGNOSIS — E1136 Type 2 diabetes mellitus with diabetic cataract: Secondary | ICD-10-CM | POA: Diagnosis not present

## 2023-11-02 DIAGNOSIS — H25812 Combined forms of age-related cataract, left eye: Secondary | ICD-10-CM | POA: Diagnosis not present

## 2023-11-04 ENCOUNTER — Ambulatory Visit

## 2023-11-13 ENCOUNTER — Ambulatory Visit (INDEPENDENT_AMBULATORY_CARE_PROVIDER_SITE_OTHER): Admitting: Nurse Practitioner

## 2023-11-13 ENCOUNTER — Encounter: Payer: Self-pay | Admitting: Nurse Practitioner

## 2023-11-13 VITALS — BP 102/60 | HR 93 | Ht 68.0 in | Wt 191.8 lb

## 2023-11-13 DIAGNOSIS — Z794 Long term (current) use of insulin: Secondary | ICD-10-CM

## 2023-11-13 DIAGNOSIS — I152 Hypertension secondary to endocrine disorders: Secondary | ICD-10-CM

## 2023-11-13 DIAGNOSIS — N184 Chronic kidney disease, stage 4 (severe): Secondary | ICD-10-CM

## 2023-11-13 DIAGNOSIS — E1122 Type 2 diabetes mellitus with diabetic chronic kidney disease: Secondary | ICD-10-CM | POA: Diagnosis not present

## 2023-11-13 DIAGNOSIS — E1159 Type 2 diabetes mellitus with other circulatory complications: Secondary | ICD-10-CM

## 2023-11-13 DIAGNOSIS — E782 Mixed hyperlipidemia: Secondary | ICD-10-CM

## 2023-11-13 MED ORDER — TRESIBA FLEXTOUCH 100 UNIT/ML ~~LOC~~ SOPN: 15.0000 [IU] | PEN_INJECTOR | Freq: Every day | SUBCUTANEOUS | 3 refills | Status: DC
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
## 2023-11-16 DIAGNOSIS — N183 Chronic kidney disease, stage 3 unspecified: Secondary | ICD-10-CM | POA: Diagnosis not present

## 2023-11-16 DIAGNOSIS — E1122 Type 2 diabetes mellitus with diabetic chronic kidney disease: Secondary | ICD-10-CM | POA: Diagnosis not present

## 2023-11-16 DIAGNOSIS — E1136 Type 2 diabetes mellitus with diabetic cataract: Secondary | ICD-10-CM | POA: Diagnosis not present

## 2023-11-16 DIAGNOSIS — H25811 Combined forms of age-related cataract, right eye: Secondary | ICD-10-CM | POA: Diagnosis not present

## 2023-11-23 ENCOUNTER — Other Ambulatory Visit: Payer: Self-pay | Admitting: *Deleted

## 2023-11-23 ENCOUNTER — Telehealth: Payer: Self-pay | Admitting: Nurse Practitioner

## 2023-11-23 DIAGNOSIS — N184 Chronic kidney disease, stage 4 (severe): Secondary | ICD-10-CM

## 2023-11-23 DIAGNOSIS — E1122 Type 2 diabetes mellitus with diabetic chronic kidney disease: Secondary | ICD-10-CM

## 2023-11-23 DIAGNOSIS — Z794 Long term (current) use of insulin: Secondary | ICD-10-CM

## 2023-11-23 DIAGNOSIS — E1169 Type 2 diabetes mellitus with other specified complication: Secondary | ICD-10-CM

## 2023-11-23 MED ORDER — TRESIBA FLEXTOUCH 100 UNIT/ML ~~LOC~~ SOPN: 15.0000 [IU] | PEN_INJECTOR | Freq: Every day | SUBCUTANEOUS | 3 refills | Status: AC

## 2023-11-23 NOTE — Telephone Encounter (Signed)
 Meade (Chief Operating Officer) is calling in for the pt stating that he took his last dose of Tresiba  last night and [facility] is stating that they have not received it.  Meade is wanting to see if we could resend it to the pharmacy so the pt could have it for tonight.

## 2023-11-23 NOTE — Telephone Encounter (Signed)
 A refill request has been sent to the patient's pharmacy for his Tresiba .

## 2023-11-24 ENCOUNTER — Telehealth: Payer: Self-pay | Admitting: *Deleted

## 2023-11-24 NOTE — Telephone Encounter (Signed)
 David Zamora, caregiver called as patient has not received his insulin  yet. I called [facility] as the RX was sent and we can see that the pharmacy recd' it. Talked with Rosaline , she states that on yesterday, they did receive the RX but a temp pharmacist resourced it out to the [facility] in [place].  She was going to call and see if the driver could pick it up and bring it back to them, or call the caregiver and see if they could go to the [facility] and pick it up.  I called David back and left a voicemail with the information on it.

## 2023-11-30 DIAGNOSIS — I129 Hypertensive chronic kidney disease with stage 1 through stage 4 chronic kidney disease, or unspecified chronic kidney disease: Secondary | ICD-10-CM | POA: Diagnosis not present

## 2023-11-30 DIAGNOSIS — E875 Hyperkalemia: Secondary | ICD-10-CM | POA: Diagnosis not present

## 2023-11-30 DIAGNOSIS — E8722 Chronic metabolic acidosis: Secondary | ICD-10-CM | POA: Diagnosis not present

## 2023-11-30 DIAGNOSIS — N1832 Chronic kidney disease, stage 3b: Secondary | ICD-10-CM | POA: Diagnosis not present

## 2023-12-08 ENCOUNTER — Other Ambulatory Visit: Payer: Self-pay | Admitting: Family Medicine

## 2023-12-08 DIAGNOSIS — E1122 Type 2 diabetes mellitus with diabetic chronic kidney disease: Secondary | ICD-10-CM

## 2023-12-08 DIAGNOSIS — K219 Gastro-esophageal reflux disease without esophagitis: Secondary | ICD-10-CM

## 2023-12-08 DIAGNOSIS — N183 Chronic kidney disease, stage 3 unspecified: Secondary | ICD-10-CM

## 2023-12-08 DIAGNOSIS — R634 Abnormal weight loss: Secondary | ICD-10-CM

## 2023-12-08 DIAGNOSIS — E1159 Type 2 diabetes mellitus with other circulatory complications: Secondary | ICD-10-CM

## 2024-01-12 DIAGNOSIS — B351 Tinea unguium: Secondary | ICD-10-CM | POA: Diagnosis not present

## 2024-01-12 DIAGNOSIS — E1142 Type 2 diabetes mellitus with diabetic polyneuropathy: Secondary | ICD-10-CM | POA: Diagnosis not present

## 2024-01-12 DIAGNOSIS — M79674 Pain in right toe(s): Secondary | ICD-10-CM | POA: Diagnosis not present

## 2024-01-12 DIAGNOSIS — L84 Corns and callosities: Secondary | ICD-10-CM | POA: Diagnosis not present

## 2024-01-12 DIAGNOSIS — M79675 Pain in left toe(s): Secondary | ICD-10-CM | POA: Diagnosis not present

## 2024-01-19 ENCOUNTER — Other Ambulatory Visit: Payer: Self-pay | Admitting: Family Medicine

## 2024-01-19 DIAGNOSIS — E1122 Type 2 diabetes mellitus with diabetic chronic kidney disease: Secondary | ICD-10-CM

## 2024-01-19 DIAGNOSIS — E1159 Type 2 diabetes mellitus with other circulatory complications: Secondary | ICD-10-CM

## 2024-02-01 ENCOUNTER — Other Ambulatory Visit: Payer: Self-pay | Admitting: Family Medicine

## 2024-02-01 DIAGNOSIS — E1169 Type 2 diabetes mellitus with other specified complication: Secondary | ICD-10-CM

## 2024-02-03 ENCOUNTER — Ambulatory Visit (INDEPENDENT_AMBULATORY_CARE_PROVIDER_SITE_OTHER): Admitting: Family Medicine

## 2024-02-03 ENCOUNTER — Encounter: Payer: Self-pay | Admitting: Family Medicine

## 2024-02-03 VITALS — BP 108/69 | HR 86 | Ht 68.0 in | Wt 196.0 lb

## 2024-02-03 DIAGNOSIS — E1165 Type 2 diabetes mellitus with hyperglycemia: Secondary | ICD-10-CM

## 2024-02-03 DIAGNOSIS — Z794 Long term (current) use of insulin: Secondary | ICD-10-CM | POA: Diagnosis not present

## 2024-02-03 DIAGNOSIS — E1159 Type 2 diabetes mellitus with other circulatory complications: Secondary | ICD-10-CM

## 2024-02-03 DIAGNOSIS — E1169 Type 2 diabetes mellitus with other specified complication: Secondary | ICD-10-CM | POA: Diagnosis not present

## 2024-02-03 DIAGNOSIS — N183 Chronic kidney disease, stage 3 unspecified: Secondary | ICD-10-CM

## 2024-02-03 DIAGNOSIS — E1122 Type 2 diabetes mellitus with diabetic chronic kidney disease: Secondary | ICD-10-CM

## 2024-02-03 DIAGNOSIS — Z1211 Encounter for screening for malignant neoplasm of colon: Secondary | ICD-10-CM | POA: Diagnosis not present

## 2024-02-03 DIAGNOSIS — Z23 Encounter for immunization: Secondary | ICD-10-CM | POA: Diagnosis not present

## 2024-02-03 DIAGNOSIS — E785 Hyperlipidemia, unspecified: Secondary | ICD-10-CM | POA: Diagnosis not present

## 2024-02-03 DIAGNOSIS — I152 Hypertension secondary to endocrine disorders: Secondary | ICD-10-CM | POA: Diagnosis not present

## 2024-02-03 LAB — BAYER DCA HB A1C WAIVED: HB A1C (BAYER DCA - WAIVED): 7.7 % — ABNORMAL HIGH (ref 4.8–5.6)

## 2024-02-03 MED ORDER — SIMVASTATIN 40 MG PO TABS: ORAL_TABLET | ORAL | 3 refills | Status: AC

## 2024-02-03 NOTE — Progress Notes (Signed)
 BP 108/69   Pulse 86   Ht 5' 8 (1.727 m)   Wt 196 lb (88.9 kg)   SpO2 98%   BMI 29.80 kg/m    Subjective:   Patient ID: David Zamora, male    DOB: 1960/11/07, 63 y.o.   MRN: [id number]  HPI: David Zamora is a 63 y.o. male presenting on 02/03/2024 for Medical Management of Chronic Issues, Diabetes, and Chronic Kidney Disease   Discussed the use of AI scribe software for clinical note transcription with the patient, who gave verbal consent to proceed.  History of Present Illness   [name] is a 63 year old male who presents for a routine follow-up visit.  Hyperglycemia - Diabetes with blood glucose levels well-controlled in the mornings (130s) after taking Tresiba  at night - Evening blood glucose levels elevated, reaching the 300s after dinner  Nicotine  dependence - Continues to smoke three cigarettes per day - Previously quit smoking with nicotine  patches but resumed due to access to cigarettes from neighbors  Oral pain - Gum pain present - Has dentures but refuses to wear them - Follow-up with dentist planned          Relevant past medical, surgical, family and social history reviewed and updated as indicated. Interim medical history since our last visit reviewed. Allergies and medications reviewed and updated.  Review of Systems  Constitutional:  Negative for chills and fever.  HENT:  Positive for dental problem.   Eyes:  Negative for visual disturbance.  Respiratory:  Negative for shortness of breath and wheezing.   Cardiovascular:  Negative for chest pain and leg swelling.  Musculoskeletal:  Negative for back pain and gait problem.  Skin:  Negative for rash.  Neurological:  Negative for dizziness and light-headedness.  All other systems reviewed and are negative.   Per HPI unless specifically indicated above   Allergies as of 02/03/2024       Reactions   Soap    White soaps        Medication List        Accurate as of February 03, 2024  1:37 PM. If you have any questions, ask your nurse or doctor.          STOP taking these medications    glucose monitoring kit monitoring kit Stopped by: [facility] Eli Pattillo   Vitamin D  (Ergocalciferol ) 1.25 MG (50000 UNIT) Caps capsule Commonly known as: DRISDOL  Stopped by: [facility] Kayde Warehime       TAKE these medications    Accu-Chek Guide Test test strip Generic drug: glucose blood Test BS BID Dx E11.22   Accu-Chek Softclix Lancets lancets Test BS BID Dx E11.22   blood glucose meter kit and supplies Dispense based on patient and insurance preference. Use up to four times daily as directed. (FOR ICD-10 E10.9, E11.9).   blood glucose meter kit and supplies Kit Dispense based on patient and insurance preference. Use up to four times daily as directed. (FOR ICD-9 250.00, 250.01).   buPROPion  150 MG 24 hr tablet Commonly known as: WELLBUTRIN  XL Take 150 mg by mouth every morning.   CeraVe Crea APPLY 1 APPLICATION ONCE A DAY TO BOTH FEET   cycloSPORINE 0.05 % ophthalmic emulsion Commonly known as: RESTASIS 1 drop 2 (two) times daily.   dapagliflozin  propanediol 5 MG Tabs tablet Commonly known as: Farxiga  Take 1 tablet (5 mg total) by mouth daily.   divalproex  250 MG 24 hr tablet Commonly known as: DEPAKOTE   ER Take 250-1,000 mg by mouth See admin instructions. 250mg  in the morning and 1000mg   at bedtime   Global Ease Inject Pen Needles 31G X 5 MM Misc Generic drug: Insulin  Pen Needle SMARTSIG:Syringe(s) SUB-Q   TRUEplus Pen Needles 31G X 8 MM Misc Generic drug: Insulin  Pen Needle Use to inject insulin  once daily   ibuprofen  200 MG tablet Commonly known as: Motrin  IB Take 2 tablets (400 mg total) by mouth daily.   lisinopril  10 MG tablet Commonly known as: ZESTRIL  TAKE (1) TABLET BY MOUTH ONCE DAILY.   Lokelma 5 g packet Generic drug: sodium zirconium cyclosilicate Take 5 g by mouth.   loperamide  2 MG tablet Commonly known as: Imodium  A-D Take  1 tablet (2 mg total) by mouth 4 (four) times daily as needed for diarrhea or loose stools.   omega-3 acid ethyl esters 1 g capsule Commonly known as: LOVAZA  Take 2 capsules (2 g total) by mouth 2 (two) times daily.   omeprazole  40 MG capsule Commonly known as: PRILOSEC TAKE 1 CAPSULE BY MOUTH ONCE DAILY.   Ozempic  (1 MG/DOSE) 4 MG/3ML Sopn Generic drug: Semaglutide  (1 MG/DOSE) INJECT 1MG  INTO THE SKIN ONCE A WEEK   Pain Relief  Extra Strength 500 MG tablet Generic drug: acetaminophen  Take 1 tablet (500 mg total) by mouth every 6 (six) hours as needed.   PARoxetine  30 MG tablet Commonly known as: PAXIL  Take 30 mg by mouth daily.   prednisoLONE acetate 1 % ophthalmic suspension Commonly known as: PRED FORTE Place 1 drop into the left eye 4 (four) times daily.   QUEtiapine  50 MG Tb24 24 hr tablet Commonly known as: SEROQUEL  XR Take 50 mg by mouth daily.   simvastatin  40 MG tablet Commonly known as: ZOCOR  One tablet daily   tamsulosin  0.4 MG Caps capsule Commonly known as: FLOMAX  Take 1 capsule (0.4 mg total) by mouth daily.   Tegaderm Film 6x8 Misc Use to cover free style libre sensor   tolnaftate  1 % spray Commonly known as: TINACTIN Apply topically 2 (two) times daily.   Athletes Foot Spray 1 % Aero Generic drug: Tolnaftate  APPLY TOPICALLY 2 TIMES A DAY.   Tresiba  FlexTouch 100 UNIT/ML FlexTouch Pen Generic drug: insulin  degludec Inject 15 Units into the skin at bedtime.   triamcinolone ointment 0.5 % Commonly known as: KENALOG APPLY TOPICALLY TWICE DAILY.         Objective:   BP 108/69   Pulse 86   Ht 5' 8 (1.727 m)   Wt 196 lb (88.9 kg)   SpO2 98%   BMI 29.80 kg/m   Wt Readings from Last 3 Encounters:  02/03/24 196 lb (88.9 kg)  11/13/23 191 lb 12.8 oz (87 kg)  10/29/23 191 lb (86.6 kg)    Physical Exam Vitals and nursing note reviewed.  Constitutional:      Appearance: Normal appearance. He is obese.  Neurological:     Mental  Status: He is alert.    Physical Exam   NECK: Thyroid  normal on palpation. CHEST: Lungs clear to auscultation bilaterally. CARDIOVASCULAR: Heart regular rate and rhythm.       Results for orders placed or performed in visit on 11/06/23  [facility] DIABETES EYE EXAM   Collection Time: 10/29/23  3:27 PM  Result Value Ref Range   [facility] Diabetic Eye Exam      Assessment & Plan:   Problem List Items Addressed This Visit       Cardiovascular and Mediastinum   Hypertension associated  with diabetes (HCC)   Relevant Medications   simvastatin  (ZOCOR ) 40 MG tablet   Other Relevant Orders   CBC with Differential/Platelet   CMP14+EGFR   Lipid panel   Bayer DCA Hb A1c Waived     Endocrine   Controlled type 2 diabetes mellitus with stage 3 chronic kidney disease, with long-term current use of insulin  (HCC) - Primary   Relevant Medications   simvastatin  (ZOCOR ) 40 MG tablet   CKD stage 3 due to type 2 diabetes mellitus (HCC)   Relevant Medications   simvastatin  (ZOCOR ) 40 MG tablet   Other Relevant Orders   CBC with Differential/Platelet   CMP14+EGFR   Lipid panel   Bayer DCA Hb A1c Waived   Hyperlipidemia associated with type 2 diabetes mellitus (HCC)   Relevant Medications   simvastatin  (ZOCOR ) 40 MG tablet   Other Relevant Orders   CBC with Differential/Platelet   CMP14+EGFR   Lipid panel   Bayer DCA Hb A1c Waived   Other Visit Diagnoses       Type 2 diabetes mellitus with hyperglycemia, with long-term current use of insulin  (HCC)       Relevant Medications   simvastatin  (ZOCOR ) 40 MG tablet     Colon cancer screening       Relevant Orders   Cologuard          Type 2 diabetes mellitus with hyperglycemia Evening postprandial hyperglycemia persists despite satisfactory HbA1c. - Continue current diabetes management regimen, including Tresiba  at night. - A1c is 7.7 which is up from where it has been, just focus on not having extra foods or too many carbs.  Sees endocrinology as  well  Nicotine  dependence (active smoking) Continues smoking three cigarettes daily. Previous cessation attempts with nicotine  patches were successful, but relapse occurred when not monitored. - Allow three cigarettes daily to address safety concerns related to sneaking.       Follow up plan: Return in about 3 months (around 05/05/2024), or if symptoms worsen or fail to improve, for Diabetes recheck.  Counseling provided for all of the vaccine components Orders Placed This Encounter  Procedures   CBC with Differential/Platelet   CMP14+EGFR   Lipid panel   Bayer DCA Hb A1c Waived   Cologuard    Fonda Levins, MD [facility] 02/03/2024, 1:37 PM

## 2024-02-04 LAB — CBC WITH DIFFERENTIAL/PLATELET
Basophils Absolute: 0 x10E3/uL (ref 0.0–0.2)
Basos: 1 %
EOS (ABSOLUTE): 0.2 x10E3/uL (ref 0.0–0.4)
Eos: 3 %
Hematocrit: 40.2 % (ref 37.5–51.0)
Hemoglobin: 13.7 g/dL (ref 13.0–17.7)
Immature Grans (Abs): 0.1 x10E3/uL (ref 0.0–0.1)
Immature Granulocytes: 1 %
Lymphocytes Absolute: 2.2 x10E3/uL (ref 0.7–3.1)
Lymphs: 35 %
MCH: 33.8 pg — ABNORMAL HIGH (ref 26.6–33.0)
MCHC: 34.1 g/dL (ref 31.5–35.7)
MCV: 99 fL — ABNORMAL HIGH (ref 79–97)
Monocytes Absolute: 0.5 x10E3/uL (ref 0.1–0.9)
Monocytes: 8 %
Neutrophils Absolute: 3.3 x10E3/uL (ref 1.4–7.0)
Neutrophils: 52 %
Platelets: 226 x10E3/uL (ref 150–450)
RBC: 4.05 x10E6/uL — ABNORMAL LOW (ref 4.14–5.80)
RDW: 13 % (ref 11.6–15.4)
WBC: 6.2 x10E3/uL (ref 3.4–10.8)

## 2024-02-04 LAB — CMP14+EGFR
ALT: 14 IU/L (ref 0–44)
AST: 20 IU/L (ref 0–40)
Albumin: 3.9 g/dL (ref 3.9–4.9)
Alkaline Phosphatase: 69 IU/L (ref 47–123)
BUN/Creatinine Ratio: 15 (ref 10–24)
BUN: 36 mg/dL — ABNORMAL HIGH (ref 8–27)
Bilirubin Total: 0.3 mg/dL (ref 0.0–1.2)
CO2: 21 mmol/L (ref 20–29)
Calcium: 8.9 mg/dL (ref 8.6–10.2)
Chloride: 99 mmol/L (ref 96–106)
Creatinine, Ser: 2.48 mg/dL — ABNORMAL HIGH (ref 0.76–1.27)
Globulin, Total: 2.1 g/dL (ref 1.5–4.5)
Glucose: 222 mg/dL — ABNORMAL HIGH (ref 70–99)
Potassium: 5.1 mmol/L (ref 3.5–5.2)
Sodium: 134 mmol/L (ref 134–144)
Total Protein: 6 g/dL (ref 6.0–8.5)
eGFR: 28 mL/min/1.73 — ABNORMAL LOW (ref >59)

## 2024-02-04 LAB — LIPID PANEL
Chol/HDL Ratio: 6.9 ratio — ABNORMAL HIGH (ref 0.0–5.0)
Cholesterol, Total: 241 mg/dL — ABNORMAL HIGH (ref 100–199)
HDL: 35 mg/dL — ABNORMAL LOW (ref >39)
LDL Chol Calc (NIH): 130 mg/dL — ABNORMAL HIGH (ref 0–99)
Triglycerides: 424 mg/dL — ABNORMAL HIGH (ref 0–149)
VLDL Cholesterol Cal: 76 mg/dL — ABNORMAL HIGH (ref 5–40)

## 2024-02-10 ENCOUNTER — Ambulatory Visit: Payer: Self-pay | Admitting: Family Medicine

## 2024-02-12 NOTE — Telephone Encounter (Signed)
 Called patient, no answer

## 2024-02-29 ENCOUNTER — Telehealth: Payer: Self-pay | Admitting: Pharmacist

## 2024-02-29 NOTE — Telephone Encounter (Signed)
   This patient is appearing on a report for being at risk of failing the adherence measure for hypertension (ACEi/ARB) medications this calendar year.   Medication: lisinopril  Last fill date: 11/10 for 30 day supply--on pill packs through [facility] pharmacy --will only do 30 day fills Patient at group home  Insurance report was not up to date. No action needed at this time.    Latecia Miler Dattero Rotunda Worden, PharmD, BCACP, CPP Clinical Pharmacist, [facility]

## 2024-03-16 ENCOUNTER — Ambulatory Visit: Admitting: Nurse Practitioner

## 2024-03-16 DIAGNOSIS — E1159 Type 2 diabetes mellitus with other circulatory complications: Secondary | ICD-10-CM

## 2024-03-16 DIAGNOSIS — E1122 Type 2 diabetes mellitus with diabetic chronic kidney disease: Secondary | ICD-10-CM

## 2024-03-16 DIAGNOSIS — Z794 Long term (current) use of insulin: Secondary | ICD-10-CM

## 2024-03-16 DIAGNOSIS — E782 Mixed hyperlipidemia: Secondary | ICD-10-CM

## 2024-03-29 ENCOUNTER — Encounter: Payer: Self-pay | Admitting: Nurse Practitioner

## 2024-03-29 ENCOUNTER — Ambulatory Visit: Admitting: Nurse Practitioner

## 2024-03-29 VITALS — BP 118/74 | HR 88 | Ht 68.0 in | Wt 195.2 lb

## 2024-03-29 DIAGNOSIS — E1122 Type 2 diabetes mellitus with diabetic chronic kidney disease: Secondary | ICD-10-CM | POA: Diagnosis not present

## 2024-03-29 DIAGNOSIS — E1159 Type 2 diabetes mellitus with other circulatory complications: Secondary | ICD-10-CM

## 2024-03-29 DIAGNOSIS — Z794 Long term (current) use of insulin: Secondary | ICD-10-CM

## 2024-03-29 DIAGNOSIS — E782 Mixed hyperlipidemia: Secondary | ICD-10-CM

## 2024-03-29 DIAGNOSIS — I152 Hypertension secondary to endocrine disorders: Secondary | ICD-10-CM

## 2024-03-29 DIAGNOSIS — N184 Chronic kidney disease, stage 4 (severe): Secondary | ICD-10-CM | POA: Diagnosis not present

## 2024-03-29 MED ORDER — OZEMPIC (2 MG/DOSE) 8 MG/3ML ~~LOC~~ SOPN: 2.0000 mg | PEN_INJECTOR | SUBCUTANEOUS | 3 refills | Status: DC
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
## 2024-04-25 ENCOUNTER — Other Ambulatory Visit: Payer: Self-pay | Admitting: Family Medicine

## 2024-04-25 ENCOUNTER — Other Ambulatory Visit: Payer: Self-pay | Admitting: *Deleted

## 2024-04-25 DIAGNOSIS — Z794 Long term (current) use of insulin: Secondary | ICD-10-CM

## 2024-04-25 DIAGNOSIS — E1159 Type 2 diabetes mellitus with other circulatory complications: Secondary | ICD-10-CM

## 2024-04-25 DIAGNOSIS — R946 Abnormal results of thyroid function studies: Secondary | ICD-10-CM

## 2024-04-25 DIAGNOSIS — E1122 Type 2 diabetes mellitus with diabetic chronic kidney disease: Secondary | ICD-10-CM

## 2024-04-25 DIAGNOSIS — E1169 Type 2 diabetes mellitus with other specified complication: Secondary | ICD-10-CM

## 2024-04-25 DIAGNOSIS — E782 Mixed hyperlipidemia: Secondary | ICD-10-CM

## 2024-04-25 MED ORDER — OZEMPIC (2 MG/DOSE) 8 MG/3ML ~~LOC~~ SOPN: 2.0000 mg | PEN_INJECTOR | SUBCUTANEOUS | 3 refills | Status: AC

## 2024-04-25 NOTE — Telephone Encounter (Signed)
 Ms. David Zamora left a VM asking that we resend the prescription in for the patient's Ozempic  2 mg.It was recently denied and the pharmacy does not know why. Patient injected hid last dose today, Mondays are his day to inject.  I have resent the prescription as she ask to the [facility]

## 2024-05-06 ENCOUNTER — Ambulatory Visit: Payer: Self-pay | Admitting: Family Medicine

## 2024-05-10 ENCOUNTER — Encounter: Payer: Self-pay | Admitting: Family Medicine

## 2024-07-28 ENCOUNTER — Ambulatory Visit: Admitting: Nurse Practitioner
